# Patient Record
Sex: Female | Born: 1985 | Race: White | Hispanic: No | Marital: Married | Smoking: Never smoker
Health system: Southern US, Community
[De-identification: ages and names within clinical notes are randomized; demographics above are authoritative.]

## PROBLEM LIST (undated history)

## (undated) ENCOUNTER — Inpatient Hospital Stay (HOSPITAL_COMMUNITY): Payer: Self-pay

## (undated) DIAGNOSIS — G2581 Restless legs syndrome: Secondary | ICD-10-CM

## (undated) DIAGNOSIS — F419 Anxiety disorder, unspecified: Secondary | ICD-10-CM

## (undated) DIAGNOSIS — R011 Cardiac murmur, unspecified: Secondary | ICD-10-CM

## (undated) DIAGNOSIS — K589 Irritable bowel syndrome without diarrhea: Secondary | ICD-10-CM

## (undated) DIAGNOSIS — K219 Gastro-esophageal reflux disease without esophagitis: Secondary | ICD-10-CM

## (undated) DIAGNOSIS — F329 Major depressive disorder, single episode, unspecified: Secondary | ICD-10-CM

## (undated) DIAGNOSIS — G43909 Migraine, unspecified, not intractable, without status migrainosus: Secondary | ICD-10-CM

## (undated) DIAGNOSIS — D649 Anemia, unspecified: Secondary | ICD-10-CM

## (undated) DIAGNOSIS — E785 Hyperlipidemia, unspecified: Secondary | ICD-10-CM

## (undated) DIAGNOSIS — F32A Depression, unspecified: Secondary | ICD-10-CM

## (undated) DIAGNOSIS — Z8619 Personal history of other infectious and parasitic diseases: Secondary | ICD-10-CM

## (undated) DIAGNOSIS — Z86018 Personal history of other benign neoplasm: Secondary | ICD-10-CM

## (undated) HISTORY — DX: Irritable bowel syndrome, unspecified: K58.9

## (undated) HISTORY — DX: Hyperlipidemia, unspecified: E78.5

## (undated) HISTORY — PX: WISDOM TOOTH EXTRACTION: SHX21

## (undated) HISTORY — DX: Major depressive disorder, single episode, unspecified: F32.9

## (undated) HISTORY — DX: Personal history of other benign neoplasm: Z86.018

## (undated) HISTORY — DX: Migraine, unspecified, not intractable, without status migrainosus: G43.909

## (undated) HISTORY — DX: Gastro-esophageal reflux disease without esophagitis: K21.9

## (undated) HISTORY — DX: Restless legs syndrome: G25.81

## (undated) HISTORY — DX: Anemia, unspecified: D64.9

## (undated) HISTORY — DX: Anxiety disorder, unspecified: F41.9

## (undated) HISTORY — DX: Depression, unspecified: F32.A

## (undated) HISTORY — DX: Personal history of other infectious and parasitic diseases: Z86.19

---
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
## 2007-06-05 ENCOUNTER — Other Ambulatory Visit: Admission: RE | Admit: 2007-06-05 | Discharge: 2007-06-05 | Payer: Self-pay | Admitting: Obstetrics and Gynecology

## 2008-06-08 ENCOUNTER — Other Ambulatory Visit: Admission: RE | Admit: 2008-06-08 | Discharge: 2008-06-08 | Payer: Self-pay | Admitting: Obstetrics and Gynecology

## 2011-01-05 NOTE — Assessment & Plan Note (Signed)
[facility]                         GASTROENTEROLOGY OFFICE NOTE   NAME:Gina Moreno, Gina Moreno                        MRN:          [id number]  DATE:10/14/2006                            DOB:          [birth year]    CHIEF COMPLAINT:  Nausea, lactose intolerance, intermittent sour  belching.   CHIEF COMPLAINT:  An 25 year old white woman who was started on proton-  pump inhibitor therapy with Prevacid about 6 years ago.  That seemed to  help indigestion problems.  That stopped working and she then began to  take AcipHex with a reasonable benefit.  She was having a lot of  belching problems which seemed to get better.  She continues to have  problems at times where she will have intermittent belching and have a  very sour and foul smell come up.  She has not been losing weight.  She  does not have dysphagia or bleeding.  She has some intermittent  epigastric discomfort which is vague and mainly a nauseous type feeling.  She has tried to avoid lactose products without significant relief.  She  does avoid pork, barbecue, hot dogs, as those will trigger it.  She  tends to be slightly nervous at times but does not have any underlying  anxiety disorder.  She saw Dr. Yehuda Budd' nurse practitioner or physical  assistant a while back and, in December, she had a negative H. pylori  serology, a negative urinalysis, and a normal CMET.  She had a negative  urine pregnancy as well.  She has not been vomiting.   PAST MEDICAL HISTORY:  1. Clinical diagnosis of gastroesophageal reflux disease.  2. Allergies and sinus problems.   FAMILY HISTORY:  A paternal grandfather had colon cancer, a grandmother  had colon polyps.  Other family history review is negative.   SOCIAL HISTORY:  She is single.  She is about to graduate from high  school.  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  No alcohol, tobacco, or drugs.   REVIEW OF SYSTEMS:  Some dysmenorrhea.   She does have a history of  hives.  She takes Zyrtec for that and her allergies. She recently had a  URI.  She uses eyeglasses at times.   All other systems are negative.   PHYSICAL EXAM:  A thin, well-developed, well-nourished white woman.  Height 5 feet 5 inches.  Weight 115 pounds.  Blood pressure 118/78.  Pulse 100.  EYES:  Anicteric.  ENT:  Normal mouth and posterior pharynx.  NECK:  Supple without thyromegaly.  CHEST:  Clear.  HEART:  S1, S2.  No rubs or gallops.  ABDOMEN:  Soft, nontender.  No organomegaly or mass.  EXTREMITIES:  Free of edema.  She is alert and oriented x3.  SKIN:  Warm, dry. No acute rash.   ASSESSMENT:  Epigastric pain/nausea.  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  She is Helicobacter negative. She may have some  component of reflux problems as well.   PLAN:  1. A trial of Levbid 1/2 to 1 tablet b.i.d.  2. CBC  with differential.  3. Consider upper gastrointestinal endoscopy though I think it would      be low yield at this point.  I will see how she responds to the      Levbid.  Another option would be Probiotics versus a course of      antibiotics for possible bacterial overgrowth which may be part of      this problem.  I will see her back in 4-6 weeks.     Iva Boop, MD,FACG  Electronically Signed    CEG/MedQ  DD: 10/14/2006  DT: 10/14/2006  Job #: [id number]   cc:   Tammy R. Collins [place], M.D.

## 2012-08-14 LAB — OB RESULTS CONSOLE RPR: RPR: NONREACTIVE

## 2012-08-14 LAB — OB RESULTS CONSOLE GC/CHLAMYDIA
Chlamydia: NEGATIVE
Gonorrhea: NEGATIVE

## 2012-08-14 LAB — OB RESULTS CONSOLE RUBELLA ANTIBODY, IGM: Rubella: IMMUNE

## 2012-08-14 LAB — OB RESULTS CONSOLE ABO/RH

## 2012-08-14 LAB — OB RESULTS CONSOLE HIV ANTIBODY (ROUTINE TESTING): HIV: NONREACTIVE

## 2012-08-20 NOTE — L&D Delivery Note (Signed)
Delivery Note At 11:18 AM a viable female was delivered via Vaginal, Spontaneous Delivery (Presentation:OA ;  ).  APGAR: , ; weight .   Placenta status: Intact, Spontaneous.  Cord: 3 vessels with the following complications: .  Cord pH: not sent  Anesthesia: Epidural  Episiotomy: None Lacerations: 2nd degree Suture Repair: 3.0 vicryl rapide Est. Blood Loss (mL): 300  Mom to postpartum.  Baby to nursery-stable.  Meriel Pica 03/14/2013, 11:41 AM

## 2012-09-27 MED ORDER — ERYTHROMYCIN 5 MG/GM OP OINT
TOPICAL_OINTMENT | OPHTHALMIC | Status: AC
  Filled 2012-09-27: qty 1

## 2013-03-08 ENCOUNTER — Encounter (HOSPITAL_COMMUNITY): Payer: Self-pay | Admitting: *Deleted

## 2013-03-08 ENCOUNTER — Inpatient Hospital Stay (HOSPITAL_COMMUNITY)
Admission: AD | Admit: 2013-03-08 | Discharge: 2013-03-08 | Disposition: A | Discharge status: Home / Self Care | Payer: 59 | Source: Ambulatory Visit | Attending: Obstetrics and Gynecology | Admitting: Obstetrics and Gynecology

## 2013-03-08 DIAGNOSIS — R5381 Other malaise: Secondary | ICD-10-CM | POA: Insufficient documentation

## 2013-03-08 DIAGNOSIS — R7989 Other specified abnormal findings of blood chemistry: Secondary | ICD-10-CM | POA: Insufficient documentation

## 2013-03-08 DIAGNOSIS — O479 False labor, unspecified: Secondary | ICD-10-CM | POA: Insufficient documentation

## 2013-03-08 HISTORY — DX: Gastro-esophageal reflux disease without esophagitis: K21.9

## 2013-03-08 HISTORY — DX: Anxiety disorder, unspecified: F41.9

## 2013-03-08 LAB — URINE MICROSCOPIC-ADD ON

## 2013-03-08 LAB — URINALYSIS, ROUTINE W REFLEX MICROSCOPIC
Bilirubin Urine: NEGATIVE
Glucose, UA: NEGATIVE mg/dL
Hgb urine dipstick: NEGATIVE
Ketones, ur: NEGATIVE mg/dL
Nitrite: NEGATIVE
Protein, ur: NEGATIVE mg/dL
Specific Gravity, Urine: <1.005 — ABNORMAL LOW (ref 1.005–1.030)
Urobilinogen, UA: 0.2 mg/dL (ref 0.0–1.0)
pH: 6.5 (ref 5.0–8.0)

## 2013-03-08 NOTE — MAU Note (Signed)
Pt presents with complaints of contractions that have gotten worse today and is very weak. Pt states that she has low iron and is not able to take it now because it makes her nauseous and she starts vomiting.

## 2013-03-12 ENCOUNTER — Telehealth (HOSPITAL_COMMUNITY): Payer: Self-pay | Admitting: *Deleted

## 2013-03-12 ENCOUNTER — Encounter (HOSPITAL_COMMUNITY): Payer: Self-pay | Admitting: *Deleted

## 2013-03-12 LAB — OB RESULTS CONSOLE GBS: GBS: NEGATIVE

## 2013-03-12 NOTE — Telephone Encounter (Signed)
Preadmission screen  

## 2013-03-13 ENCOUNTER — Encounter (HOSPITAL_COMMUNITY): Payer: Self-pay | Admitting: *Deleted

## 2013-03-13 ENCOUNTER — Inpatient Hospital Stay (HOSPITAL_COMMUNITY)
Admission: AD | Admit: 2013-03-13 | Discharge: 2013-03-13 | Disposition: A | Discharge status: Home / Self Care | Payer: 59 | Source: Ambulatory Visit | Attending: Obstetrics and Gynecology | Admitting: Obstetrics and Gynecology

## 2013-03-13 ENCOUNTER — Inpatient Hospital Stay (HOSPITAL_COMMUNITY)
Admission: AD | Admit: 2013-03-13 | Discharge: 2013-03-16 | DRG: 775 | Disposition: A | Discharge status: Home / Self Care | Payer: 59 | Source: Ambulatory Visit | Attending: Obstetrics and Gynecology | Admitting: Obstetrics and Gynecology

## 2013-03-13 DIAGNOSIS — O479 False labor, unspecified: Secondary | ICD-10-CM | POA: Insufficient documentation

## 2013-03-13 DIAGNOSIS — O878 Other venous complications in the puerperium: Secondary | ICD-10-CM | POA: Diagnosis present

## 2013-03-13 DIAGNOSIS — K649 Unspecified hemorrhoids: Secondary | ICD-10-CM | POA: Diagnosis present

## 2013-03-13 MED ORDER — OXYTOCIN 40 UNITS IN LACTATED RINGERS INFUSION - SIMPLE MED
62.5000 mL/h | INTRAVENOUS | Status: DC
  Administered 2013-03-14: 62.5 mL/h via INTRAVENOUS
  Filled 2013-03-13: qty 1000

## 2013-03-13 MED ORDER — FLEET ENEMA 7-19 GM/118ML RE ENEM: 1.0000 | ENEMA | RECTAL | Status: DC | PRN

## 2013-03-13 MED ORDER — OXYCODONE-ACETAMINOPHEN 5-325 MG PO TABS: 1.0000 | ORAL_TABLET | ORAL | Status: DC | PRN

## 2013-03-13 MED ORDER — LACTATED RINGERS IV SOLN
INTRAVENOUS | Status: DC
  Administered 2013-03-14 (×2): via INTRAVENOUS

## 2013-03-13 MED ORDER — LACTATED RINGERS IV SOLN: 500.0000 mL | INTRAVENOUS | Status: DC | PRN

## 2013-03-13 MED ORDER — CITRIC ACID-SODIUM CITRATE 334-500 MG/5ML PO SOLN: 30.0000 mL | ORAL | Status: DC | PRN

## 2013-03-13 MED ORDER — ONDANSETRON HCL 4 MG/2ML IJ SOLN: 4.0000 mg | Freq: Four times a day (QID) | INTRAMUSCULAR | Status: DC | PRN

## 2013-03-13 MED ORDER — ACETAMINOPHEN 325 MG PO TABS: 650.0000 mg | ORAL_TABLET | ORAL | Status: DC | PRN

## 2013-03-13 MED ORDER — IBUPROFEN 600 MG PO TABS
600.0000 mg | ORAL_TABLET | Freq: Four times a day (QID) | ORAL | Status: DC | PRN
  Administered 2013-03-14: 600 mg via ORAL
  Filled 2013-03-13: qty 1

## 2013-03-13 MED ORDER — OXYTOCIN BOLUS FROM INFUSION: 500.0000 mL | INTRAVENOUS | Status: DC

## 2013-03-13 MED ORDER — LIDOCAINE HCL (PF) 1 % IJ SOLN
30.0000 mL | INTRAMUSCULAR | Status: AC | PRN
  Administered 2013-03-14: 30 mL via SUBCUTANEOUS
  Filled 2013-03-13 (×2): qty 30

## 2013-03-13 NOTE — H&P (Signed)
Gina Moreno is a 27 y.o. female presenting for SROM and early labor Maternal Medical History:  Reason for admission: Rupture of membranes.   Contractions: Onset was 3-5 hours ago.   Frequency: regular.   Perceived severity is moderate.    Fetal activity: Perceived fetal activity is normal.   Last perceived fetal movement was within the past hour.      OB History   Grav Para Term Preterm Abortions TAB SAB Ect Mult Living   1              Past Medical History  Diagnosis Date  . GERD (gastroesophageal reflux disease)   . Anxiety   . Hx of varicella   . Depression   . IBS (irritable bowel syndrome)    Past Surgical History  Procedure Laterality Date  . Wisdom tooth extraction     Family History: family history includes Arthritis in her paternal grandmother; Cancer in her paternal grandfather and paternal grandmother; Depression in her paternal grandmother; Diabetes in her paternal grandmother; Hyperlipidemia in her father; and Thyroid disease in her mother. Social History:  reports that she has been passively smoking.  She has never used smokeless tobacco. She reports that she does not drink alcohol or use illicit drugs.   Prenatal Transfer Tool  Maternal Diabetes: none Genetic Screening: normal Maternal Ultrasounds/Referrals: normal Fetal Ultrasounds or other Referrals:  normal Maternal Substance Abuse:  none Significant Maternal Medications:  none Significant Maternal Lab Results:  none Other Comments:  None  ROS  Dilation: 3 Effacement (%): 90 Station: -2 Exam by:: Rudi Coco RN Blood pressure 119/94, pulse 116, temperature 98.1 F (36.7 C), resp. rate 20, height 5\' 6"  (1.676 m), weight 191 lb 3.2 oz (86.728 kg), last menstrual period 06/10/2012. Maternal Exam:  Uterine Assessment: Contraction strength is moderate.  Contraction frequency is regular.   Abdomen: Patient reports no abdominal tenderness. Fundal height is term FH.   Estimated fetal weight is AGA.    Fetal presentation: vertex  Introitus: Normal vulva. Normal vagina.  Amniotic fluid character: clear.  Pelvis: adequate for delivery.   Cervix: Cervix evaluated by digital exam.     Physical Exam  Constitutional: She is oriented to person, place, and time. She appears well-developed and well-nourished.  HENT:  Head: Normocephalic and atraumatic.  Neck: Normal range of motion. Neck supple.  Cardiovascular: Normal rate and regular rhythm.   Respiratory: Effort normal and breath sounds normal.  GI:  Term FH, FHR 142  Genitourinary:  clr AF, 2-3 cm/ vtx  Musculoskeletal: Normal range of motion.  Neurological: She is alert and oriented to person, place, and time.    Prenatal labs: ABO, Rh: O/Negative/-- (12/26 0000) Antibody: Negative (12/26 0000) Rubella: Immune (12/26 0000) RPR: Nonreactive (12/26 0000)  HBsAg: Negative (12/26 0000)  HIV: Non-reactive (12/26 0000)  GBS: Negative (07/24 0000)   Assessment/Plan: Term IUP, SROM + labor   Matrice Herro M 03/13/2013, 11:48 PM

## 2013-03-13 NOTE — MAU Note (Signed)
Leaking fld since 2130. Having intermittent pelvic pressure

## 2013-03-13 NOTE — MAU Note (Signed)
Gina Moreno is here for labor evaluation. The pain got severe around 2300 last night. She is [redacted]w[redacted]d; G1. Had her membranes stripped 7/23.

## 2013-03-14 ENCOUNTER — Inpatient Hospital Stay (HOSPITAL_COMMUNITY): Payer: 59 | Admitting: Anesthesiology

## 2013-03-14 ENCOUNTER — Encounter (HOSPITAL_COMMUNITY): Payer: Self-pay | Admitting: Obstetrics

## 2013-03-14 ENCOUNTER — Encounter (HOSPITAL_COMMUNITY): Payer: Self-pay | Admitting: Anesthesiology

## 2013-03-14 LAB — CBC
MCH: 30.9 pg (ref 26.0–34.0)
MCHC: 35 g/dL (ref 30.0–36.0)
Platelets: 226 10*3/uL (ref 150–400)

## 2013-03-14 LAB — RPR: RPR Ser Ql: NONREACTIVE

## 2013-03-14 MED ORDER — SENNOSIDES-DOCUSATE SODIUM 8.6-50 MG PO TABS
2.0000 | ORAL_TABLET | Freq: Every day | ORAL | Status: DC
  Administered 2013-03-14 – 2013-03-15 (×3): 2 via ORAL

## 2013-03-14 MED ORDER — PRENATAL MULTIVITAMIN CH
1.0000 | ORAL_TABLET | Freq: Every day | ORAL | Status: DC
  Administered 2013-03-15 – 2013-03-16 (×2): 1 via ORAL
  Filled 2013-03-14 (×2): qty 1

## 2013-03-14 MED ORDER — LIDOCAINE HCL (PF) 1 % IJ SOLN
INTRAMUSCULAR | Status: DC | PRN
  Administered 2013-03-14 (×2): 5 mL

## 2013-03-14 MED ORDER — TETANUS-DIPHTH-ACELL PERTUSSIS 5-2.5-18.5 LF-MCG/0.5 IM SUSP: 0.5000 mL | Freq: Once | INTRAMUSCULAR | Status: DC

## 2013-03-14 MED ORDER — IBUPROFEN 800 MG PO TABS
800.0000 mg | ORAL_TABLET | Freq: Three times a day (TID) | ORAL | Status: DC | PRN
  Administered 2013-03-14 – 2013-03-16 (×5): 800 mg via ORAL
  Filled 2013-03-14 (×6): qty 1

## 2013-03-14 MED ORDER — EPHEDRINE 5 MG/ML INJ: 10.0000 mg | INTRAVENOUS | Status: DC | PRN

## 2013-03-14 MED ORDER — DIBUCAINE 1 % RE OINT
1.0000 "application " | TOPICAL_OINTMENT | RECTAL | Status: DC | PRN
  Filled 2013-03-14: qty 28

## 2013-03-14 MED ORDER — LACTATED RINGERS IV SOLN
500.0000 mL | Freq: Once | INTRAVENOUS | Status: AC
  Administered 2013-03-14: 500 mL via INTRAVENOUS

## 2013-03-14 MED ORDER — ONDANSETRON HCL 4 MG PO TABS: 4.0000 mg | ORAL_TABLET | ORAL | Status: DC | PRN

## 2013-03-14 MED ORDER — WITCH HAZEL-GLYCERIN EX PADS
1.0000 "application " | MEDICATED_PAD | CUTANEOUS | Status: DC | PRN
  Administered 2013-03-14: 1 via TOPICAL

## 2013-03-14 MED ORDER — BUTORPHANOL TARTRATE 1 MG/ML IJ SOLN
INTRAMUSCULAR | Status: AC
  Filled 2013-03-14: qty 1

## 2013-03-14 MED ORDER — ZOLPIDEM TARTRATE 5 MG PO TABS: 5.0000 mg | ORAL_TABLET | Freq: Every evening | ORAL | Status: DC | PRN

## 2013-03-14 MED ORDER — FENTANYL 2.5 MCG/ML BUPIVACAINE 1/10 % EPIDURAL INFUSION (WH - ANES)
14.0000 mL/h | INTRAMUSCULAR | Status: DC | PRN
  Administered 2013-03-14 (×2): 14 mL/h via EPIDURAL
  Filled 2013-03-14 (×2): qty 125

## 2013-03-14 MED ORDER — BENZOCAINE-MENTHOL 20-0.5 % EX AERO
1.0000 "application " | INHALATION_SPRAY | CUTANEOUS | Status: DC | PRN
  Administered 2013-03-14: 1 via TOPICAL
  Filled 2013-03-14: qty 56

## 2013-03-14 MED ORDER — PHENYLEPHRINE 40 MCG/ML (10ML) SYRINGE FOR IV PUSH (FOR BLOOD PRESSURE SUPPORT): 80.0000 ug | PREFILLED_SYRINGE | INTRAVENOUS | Status: DC | PRN

## 2013-03-14 MED ORDER — ONDANSETRON HCL 4 MG/2ML IJ SOLN: 4.0000 mg | INTRAMUSCULAR | Status: DC | PRN

## 2013-03-14 MED ORDER — EPHEDRINE 5 MG/ML INJ
10.0000 mg | INTRAVENOUS | Status: DC | PRN
  Filled 2013-03-14: qty 4

## 2013-03-14 MED ORDER — MEASLES, MUMPS & RUBELLA VAC ~~LOC~~ INJ
0.5000 mL | INJECTION | Freq: Once | SUBCUTANEOUS | Status: DC
  Filled 2013-03-14: qty 0.5

## 2013-03-14 MED ORDER — BUTORPHANOL TARTRATE 1 MG/ML IJ SOLN
1.0000 mg | Freq: Once | INTRAMUSCULAR | Status: AC
  Administered 2013-03-14: 1 mg via INTRAVENOUS

## 2013-03-14 MED ORDER — PHENYLEPHRINE 40 MCG/ML (10ML) SYRINGE FOR IV PUSH (FOR BLOOD PRESSURE SUPPORT)
80.0000 ug | PREFILLED_SYRINGE | INTRAVENOUS | Status: DC | PRN
  Filled 2013-03-14: qty 5

## 2013-03-14 MED ORDER — LANOLIN HYDROUS EX OINT: TOPICAL_OINTMENT | CUTANEOUS | Status: DC | PRN

## 2013-03-14 MED ORDER — DIPHENHYDRAMINE HCL 25 MG PO CAPS: 25.0000 mg | ORAL_CAPSULE | Freq: Four times a day (QID) | ORAL | Status: DC | PRN

## 2013-03-14 MED ORDER — FLEET ENEMA 7-19 GM/118ML RE ENEM: 1.0000 | ENEMA | Freq: Every day | RECTAL | Status: DC | PRN

## 2013-03-14 MED ORDER — SODIUM BICARBONATE 8.4 % IV SOLN
INTRAVENOUS | Status: DC | PRN
  Administered 2013-03-14: 5 mL via EPIDURAL

## 2013-03-14 MED ORDER — BUPIVACAINE HCL (PF) 0.25 % IJ SOLN
INTRAMUSCULAR | Status: DC | PRN
  Administered 2013-03-14: 10 mL via EPIDURAL

## 2013-03-14 MED ORDER — DIPHENHYDRAMINE HCL 50 MG/ML IJ SOLN: 12.5000 mg | INTRAMUSCULAR | Status: DC | PRN

## 2013-03-14 MED ORDER — SIMETHICONE 80 MG PO CHEW: 80.0000 mg | CHEWABLE_TABLET | ORAL | Status: DC | PRN

## 2013-03-14 MED ORDER — OXYCODONE-ACETAMINOPHEN 5-325 MG PO TABS
1.0000 | ORAL_TABLET | Freq: Four times a day (QID) | ORAL | Status: DC | PRN
  Administered 2013-03-14 (×2): 1 via ORAL
  Administered 2013-03-15: 2 via ORAL
  Administered 2013-03-15: 1 via ORAL
  Administered 2013-03-16 (×2): 2 via ORAL
  Filled 2013-03-14 (×3): qty 1
  Filled 2013-03-14 (×3): qty 2

## 2013-03-14 MED ORDER — BISACODYL 10 MG RE SUPP: 10.0000 mg | Freq: Every day | RECTAL | Status: DC | PRN

## 2013-03-14 NOTE — Anesthesia Preprocedure Evaluation (Signed)
Anesthesia Evaluation  Patient identified by MRN, date of birth, ID band Patient awake    Reviewed: Allergy & Precautions, H&P , Patient's Chart, lab work & pertinent test results  Airway Mallampati: II TM Distance: >3 FB Neck ROM: full    Dental no notable dental hx.    Pulmonary neg pulmonary ROS,  breath sounds clear to auscultation  Pulmonary exam normal       Cardiovascular negative cardio ROS  Rhythm:regular Rate:Normal     Neuro/Psych negative neurological ROS  negative psych ROS   GI/Hepatic negative GI ROS, Neg liver ROS, GERD-  ,  Endo/Other  negative endocrine ROS  Renal/GU negative Renal ROS     Musculoskeletal   Abdominal   Peds  Hematology negative hematology ROS (+)   Anesthesia Other Findings GERD (gastroesophageal reflux disease)     Anxiety        Hx of varicella     Depression        IBS (irritable bowel syndrome)                 Reproductive/Obstetrics (+) Pregnancy                           Anesthesia Physical Anesthesia Plan  ASA: II  Anesthesia Plan: Epidural   Post-op Pain Management:    Induction:   Airway Management Planned:   Additional Equipment:   Intra-op Plan:   Post-operative Plan:   Informed Consent: I have reviewed the patients History and Physical, chart, labs and discussed the procedure including the risks, benefits and alternatives for the proposed anesthesia with the patient or authorized representative who has indicated his/her understanding and acceptance.     Plan Discussed with:   Anesthesia Plan Comments:         Anesthesia Quick Evaluation

## 2013-03-14 NOTE — Anesthesia Postprocedure Evaluation (Signed)
Anesthesia Post Note  Patient: Gina Moreno  Procedure(s) Performed: * No procedures listed *  Anesthesia type: Epidural  Patient location: Mother/Baby  Post pain: Pain level controlled  Post assessment: Post-op Vital signs reviewed  Last Vitals:  Filed Vitals:   03/14/13 1415  BP: 115/74  Pulse: 96  Temp: 36.5 C  Resp: 18    Post vital signs: Reviewed  Level of consciousness:alert  Complications: No apparent anesthesia complications

## 2013-03-14 NOTE — Anesthesia Procedure Notes (Signed)
Epidural Patient location during procedure: OB Start time: 03/14/2013 1:20 AM  Staffing Anesthesiologist: Angus Seller., Harrell Gave. Performed by: anesthesiologist   Preanesthetic Checklist Completed: patient identified, site marked, surgical consent, pre-op evaluation, timeout performed, IV checked, risks and benefits discussed and monitors and equipment checked  Epidural Patient position: sitting Prep: site prepped and draped and DuraPrep Patient monitoring: continuous pulse ox and blood pressure Approach: midline Injection technique: LOR air and LOR saline  Needle:  Needle type: Tuohy  Needle gauge: 17 G Needle length: 9 cm and 9 Needle insertion depth: 5 cm cm Catheter type: closed end flexible Catheter size: 19 Gauge Catheter at skin depth: 10 cm Test dose: negative  Assessment Events: blood not aspirated, injection not painful, no injection resistance, negative IV test and no paresthesia  Additional Notes Patient identified.  Risk benefits discussed including failed block, incomplete pain control, headache, nerve damage, paralysis, blood pressure changes, nausea, vomiting, reactions to medication both toxic or allergic, and postpartum back pain.  Patient expressed understanding and wished to proceed.  All questions were answered.  Sterile technique used throughout procedure and epidural site dressed with sterile barrier dressing. No paresthesia or other complications noted.The patient did not experience any signs of intravascular injection such as tinnitus or metallic taste in mouth nor signs of intrathecal spread such as rapid motor block. Please see nursing notes for vital signs.

## 2013-03-15 ENCOUNTER — Encounter (HOSPITAL_COMMUNITY): Payer: Self-pay | Admitting: *Deleted

## 2013-03-15 LAB — CBC
HCT: 25 % — ABNORMAL LOW (ref 36.0–46.0)
MCH: 30.8 pg (ref 26.0–34.0)
MCHC: 34.4 g/dL (ref 30.0–36.0)
MCV: 89.6 fL (ref 78.0–100.0)
RDW: 14.2 % (ref 11.5–15.5)

## 2013-03-15 LAB — ABO/RH: ABO/RH(D): O NEG

## 2013-03-15 MED ORDER — RHO D IMMUNE GLOBULIN 1500 UNIT/2ML IJ SOLN
300.0000 ug | Freq: Once | INTRAMUSCULAR | Status: AC
  Administered 2013-03-15: 300 ug via INTRAMUSCULAR
  Filled 2013-03-15: qty 2

## 2013-03-15 NOTE — Clinical Social Work Note (Signed)
CSW spoke with MOB.  No concerns with anxiety at this time.    Patient was referred for history of depression/anxiety. * Referral screened out by Clinical Social Worker because none of the following criteria appear to apply: ~ History of anxiety/depression during this pregnancy, or of post-partum depression. ~ Diagnosis of anxiety and/or depression within last 3 years ~ History of depression due to pregnancy loss/loss of child  OR  * Patient's symptoms currently being treated with medication and/or therapy.  Please contact the Clinical Social Worker if needs arise, or by the patient's request.  

## 2013-03-15 NOTE — Progress Notes (Signed)
Post Partum Day 1 Subjective: no complaints  Objective: Blood pressure 98/43, pulse 78, temperature 98.3 F (36.8 C), temperature source Oral, resp. rate 16, height 5\' 6"  (1.676 m), weight 191 lb 3.2 oz (86.728 kg), last menstrual period 06/10/2012, SpO2 99.00%, unknown if currently breastfeeding.  Physical Exam:  General: alert Lochia: appropriate Uterine Fundus: firm Incision: healing well DVT Evaluation: No evidence of DVT seen on physical exam.   Recent Labs  03/14/13 0035 03/15/13 0657  HGB 12.2 8.6*  HCT 34.9* 25.0*    Assessment/Plan: Plan for discharge tomorrow   LOS: 2 days   Bastion Bolger M 03/15/2013, 9:10 AM

## 2013-03-16 LAB — RH IG WORKUP (INCLUDES ABO/RH)
Antibody Screen: NEGATIVE
Unit division: 0

## 2013-03-16 MED ORDER — HYDROCORTISONE ACE-PRAMOXINE 1-1 % RE FOAM: 1.0000 | Freq: Two times a day (BID) | RECTAL | Status: DC

## 2013-03-16 MED ORDER — IBUPROFEN 800 MG PO TABS: 800.0000 mg | ORAL_TABLET | Freq: Three times a day (TID) | ORAL | Status: DC | PRN

## 2013-03-16 MED ORDER — OXYCODONE-ACETAMINOPHEN 5-325 MG PO TABS: 1.0000 | ORAL_TABLET | Freq: Four times a day (QID) | ORAL | Status: DC | PRN

## 2013-03-16 MED ORDER — PANTOPRAZOLE SODIUM 20 MG PO TBEC: 40.0000 mg | DELAYED_RELEASE_TABLET | Freq: Every day | ORAL | Status: DC

## 2013-03-16 MED ORDER — PANTOPRAZOLE SODIUM 40 MG IV SOLR
40.0000 mg | Freq: Every day | INTRAVENOUS | Status: DC
  Filled 2013-03-16: qty 40

## 2013-03-16 NOTE — Discharge Summary (Signed)
Obstetric Discharge Summary Reason for Admission: onset of labor and rupture of membranes Prenatal Procedures: ultrasound Intrapartum Procedures: spontaneous vaginal delivery Postpartum Procedures: none Complications-Operative and Postpartum: 2 degree perineal laceration Hemoglobin  Date Value Range Status  03/15/2013 8.6* 12.0 - 15.0 g/dL Final     DELTA CHECK NOTED     REPEATED TO VERIFY     HCT  Date Value Range Status  03/15/2013 25.0* 36.0 - 46.0 % Final    Physical Exam:  General: alert and cooperative Lochia: appropriate Uterine Fundus: firm Incision: perineum intact + hemorrhoids DVT Evaluation: No evidence of DVT seen on physical exam. Negative Homan's sign. No cords or calf tenderness. Calf/Ankle edema is present.  Discharge Diagnoses: Term Pregnancy-delivered  Discharge Information: Date: 03/16/2013 Activity: pelvic rest Diet: routine Medications: PNV, Ibuprofen, Percocet and proctofoam hc Condition: stable Instructions: refer to practice specific booklet Discharge to: home   Newborn Data: Live born female  Birth Weight: 8 lb 9.7 oz (3904 g) APGAR: ,   Home with mother.  CURTIS,CAROL G 03/16/2013, 8:13 AM

## 2013-03-19 ENCOUNTER — Encounter (HOSPITAL_COMMUNITY): Payer: Self-pay | Admitting: *Deleted

## 2013-06-16 ENCOUNTER — Ambulatory Visit: Payer: 59 | Admitting: Family Medicine

## 2013-07-23 ENCOUNTER — Encounter: Payer: Self-pay | Admitting: Family Medicine

## 2013-07-23 ENCOUNTER — Ambulatory Visit (INDEPENDENT_AMBULATORY_CARE_PROVIDER_SITE_OTHER): Payer: 59 | Admitting: Family Medicine

## 2013-07-23 VITALS — BP 118/82 | HR 74 | Temp 98.2°F | Ht 67.5 in | Wt 171.1 lb

## 2013-07-23 DIAGNOSIS — F329 Major depressive disorder, single episode, unspecified: Secondary | ICD-10-CM

## 2013-07-23 DIAGNOSIS — F341 Dysthymic disorder: Secondary | ICD-10-CM

## 2013-07-23 DIAGNOSIS — Z0000 Encounter for general adult medical examination without abnormal findings: Secondary | ICD-10-CM

## 2013-07-23 DIAGNOSIS — E785 Hyperlipidemia, unspecified: Secondary | ICD-10-CM

## 2013-07-23 DIAGNOSIS — D649 Anemia, unspecified: Secondary | ICD-10-CM

## 2013-07-23 DIAGNOSIS — K219 Gastro-esophageal reflux disease without esophagitis: Secondary | ICD-10-CM

## 2013-07-23 DIAGNOSIS — Z23 Encounter for immunization: Secondary | ICD-10-CM

## 2013-07-23 LAB — RENAL FUNCTION PANEL
Albumin: 4 g/dL (ref 3.5–5.2)
Calcium: 9.4 mg/dL (ref 8.4–10.5)
Glucose, Bld: 102 mg/dL — ABNORMAL HIGH (ref 70–99)
Sodium: 140 mEq/L (ref 135–145)

## 2013-07-23 LAB — LIPID PANEL
Cholesterol: 210 mg/dL — ABNORMAL HIGH (ref 0–200)
Total CHOL/HDL Ratio: 3.4 Ratio

## 2013-07-23 LAB — HEPATIC FUNCTION PANEL
ALT: 11 U/L (ref 0–35)
AST: 11 U/L (ref 0–37)
Albumin: 4 g/dL (ref 3.5–5.2)
Total Bilirubin: 0.4 mg/dL (ref 0.3–1.2)

## 2013-07-23 LAB — CBC
Hemoglobin: 12.8 g/dL (ref 12.0–15.0)
MCH: 27.6 pg (ref 26.0–34.0)
MCHC: 33.6 g/dL (ref 30.0–36.0)
RDW: 14.4 % (ref 11.5–15.5)

## 2013-07-23 MED ORDER — FLUOXETINE HCL 20 MG PO TABS: 20.0000 mg | ORAL_TABLET | Freq: Every day | ORAL | Status: DC

## 2013-07-23 MED ORDER — DEXILANT 60 MG PO CPDR: 60.0000 mg | DELAYED_RELEASE_CAPSULE | Freq: Every day | ORAL | Status: DC

## 2013-07-23 MED ORDER — DEXLANSOPRAZOLE 60 MG PO CPDR: 60.0000 mg | DELAYED_RELEASE_CAPSULE | Freq: Every day | ORAL | Status: DC

## 2013-07-23 NOTE — Patient Instructions (Signed)
Gastroesophageal Reflux Disease, Adult  Gastroesophageal reflux disease (GERD) happens when acid from your stomach flows up into the esophagus. When acid comes in contact with the esophagus, the acid causes soreness (inflammation) in the esophagus. Over time, GERD may create small holes (ulcers) in the lining of the esophagus.  CAUSES   · Increased body weight. This puts pressure on the stomach, making acid rise from the stomach into the esophagus.  · Smoking. This increases acid production in the stomach.  · Drinking alcohol. This causes decreased pressure in the lower esophageal sphincter (valve or ring of muscle between the esophagus and stomach), allowing acid from the stomach into the esophagus.  · Late evening meals and a full stomach. This increases pressure and acid production in the stomach.  · A malformed lower esophageal sphincter.  Sometimes, no cause is found.  SYMPTOMS   · Burning pain in the lower part of the mid-chest behind the breastbone and in the mid-stomach area. This may occur twice a week or more often.  · Trouble swallowing.  · Sore throat.  · Dry cough.  · Asthma-like symptoms including chest tightness, shortness of breath, or wheezing.  DIAGNOSIS   Your caregiver may be able to diagnose GERD based on your symptoms. In some cases, X-rays and other tests may be done to check for complications or to check the condition of your stomach and esophagus.  TREATMENT   Your caregiver may recommend over-the-counter or prescription medicines to help decrease acid production. Ask your caregiver before starting or adding any new medicines.   HOME CARE INSTRUCTIONS   · Change the factors that you can control. Ask your caregiver for guidance concerning weight loss, quitting smoking, and alcohol consumption.  · Avoid foods and drinks that make your symptoms worse, such as:  · Caffeine or alcoholic drinks.  · Chocolate.  · Peppermint or mint flavorings.  · Garlic and onions.  · Spicy foods.  · Citrus fruits,  such as oranges, lemons, or limes.  · Tomato-based foods such as sauce, chili, salsa, and pizza.  · Fried and fatty foods.  · Avoid lying down for the 3 hours prior to your bedtime or prior to taking a nap.  · Eat small, frequent meals instead of large meals.  · Wear loose-fitting clothing. Do not wear anything tight around your waist that causes pressure on your stomach.  · Raise the head of your bed 6 to 8 inches with wood blocks to help you sleep. Extra pillows will not help.  · Only take over-the-counter or prescription medicines for pain, discomfort, or fever as directed by your caregiver.  · Do not take aspirin, ibuprofen, or other nonsteroidal anti-inflammatory drugs (NSAIDs).  SEEK IMMEDIATE MEDICAL CARE IF:   · You have pain in your arms, neck, jaw, teeth, or back.  · Your pain increases or changes in intensity or duration.  · You develop nausea, vomiting, or sweating (diaphoresis).  · You develop shortness of breath, or you faint.  · Your vomit is green, yellow, black, or looks like coffee grounds or blood.  · Your stool is red, bloody, or black.  These symptoms could be signs of other problems, such as heart disease, gastric bleeding, or esophageal bleeding.  MAKE SURE YOU:   · Understand these instructions.  · Will watch your condition.  · Will get help right away if you are not doing well or get worse.  Document Released: 05/16/2005 Document Revised: 10/29/2011 Document Reviewed: 02/23/2011  [employer]® Patient   Information ©2014 [employer].

## 2013-07-23 NOTE — Progress Notes (Signed)
Pre visit review using our clinic review tool, if applicable. No additional management support is needed unless otherwise documented below in the visit note. 

## 2013-07-25 ENCOUNTER — Encounter: Payer: Self-pay | Admitting: Family Medicine

## 2013-07-25 DIAGNOSIS — F32A Depression, unspecified: Secondary | ICD-10-CM

## 2013-07-25 DIAGNOSIS — E785 Hyperlipidemia, unspecified: Secondary | ICD-10-CM

## 2013-07-25 DIAGNOSIS — Z0000 Encounter for general adult medical examination without abnormal findings: Secondary | ICD-10-CM | POA: Insufficient documentation

## 2013-07-25 DIAGNOSIS — K219 Gastro-esophageal reflux disease without esophagitis: Secondary | ICD-10-CM

## 2013-07-25 DIAGNOSIS — E782 Mixed hyperlipidemia: Secondary | ICD-10-CM | POA: Insufficient documentation

## 2013-07-25 DIAGNOSIS — F411 Generalized anxiety disorder: Secondary | ICD-10-CM | POA: Insufficient documentation

## 2013-07-25 DIAGNOSIS — D649 Anemia, unspecified: Secondary | ICD-10-CM

## 2013-07-25 HISTORY — DX: Hyperlipidemia, unspecified: E78.5

## 2013-07-25 HISTORY — DX: Depression, unspecified: F32.A

## 2013-07-25 HISTORY — DX: Gastro-esophageal reflux disease without esophagitis: K21.9

## 2013-07-25 HISTORY — DX: Anemia, unspecified: D64.9

## 2013-07-25 NOTE — Assessment & Plan Note (Addendum)
Was struggling with PPD but is now struggling more with more anxiety. Will try fluoxetine 20 mg daily

## 2013-07-25 NOTE — Assessment & Plan Note (Signed)
Well controlled on Dexilent, encouraged probiotics and avoid offending.

## 2013-07-25 NOTE — Progress Notes (Signed)
Patient ID: Gina Moreno, female   DOB: 09/20/1985, 27 y.o.   MRN: [id number] MALLIE GIAMBRA [id number] February 02, 1986 07/25/2013      Progress Note New Patient  Subjective  Chief Complaint  Chief Complaint  Patient presents with  . Establish Care    new patient  . Injections    flu    HPI  Patient is a 27 year old Caucasian female who is in today for new patient appointment. She is generally in good health but has been struggling with postpartum depression and now increasing anxiety levels since the birth of her son. She reports her depression is lifted anxious and irritable frequently. Has failed numerous antidepressants hopefully to try another one. Denies any recent illness. No fevers or chills. No headache or chest pain. No palpitations or shortness of breath. She struggles with some reflux but is generally well-controlled on to excellent. No GU complaints. Does have some nausea occasionally but not routinely  Past Medical History  Diagnosis Date  . GERD (gastroesophageal reflux disease)   . Anxiety   . Hx of varicella   . Depression   . IBS (irritable bowel syndrome)   . Anemia 07/25/2013  . Esophageal reflux 07/25/2013  . Anxiety and depression 07/25/2013  . Preventative health care 07/25/2013  . Other and unspecified hyperlipidemia 07/25/2013    Past Surgical History  Procedure Laterality Date  . Wisdom tooth extraction  27 yrs old    Family History  Problem Relation Age of Onset  . Hyperlipidemia Father   . Arthritis Paternal Grandmother   . Cancer Paternal Grandmother     cervical  . Diabetes Paternal Grandmother   . Depression Paternal Grandmother   . Cancer Paternal Grandfather     lung stomach  . Thyroid disease Mother     History   Social History  . Marital Status: Married    Spouse Name: N/A    Number of Children: N/A  . Years of Education: N/A   Occupational History  . Not on file.   Social History Main Topics  . Smoking status: Passive Smoke  Exposure - Never Smoker  . Smokeless tobacco: Never Used  . Alcohol Use: No  . Drug Use: No  . Sexual Activity: Yes    Partners: Male   Other Topics Concern  . Not on file   Social History Narrative  . No narrative on file    Current Outpatient Prescriptions on File Prior to Visit  Medication Sig Dispense Refill  . ibuprofen (ADVIL,MOTRIN) 800 MG tablet Take 1 tablet (800 mg total) by mouth every 8 (eight) hours as needed.  30 tablet  1   No current facility-administered medications on file prior to visit.    Allergies  Allergen Reactions  . Doxycycline Rash    Review of Systems  Review of Systems  Constitutional: Negative for fever, chills and malaise/fatigue.  HENT: Negative for congestion, hearing loss and nosebleeds.   Eyes: Negative for discharge.  Respiratory: Negative for cough, sputum production, shortness of breath and wheezing.   Cardiovascular: Negative for chest pain, palpitations and leg swelling.  Gastrointestinal: Positive for heartburn. Negative for nausea, vomiting, abdominal pain, diarrhea, constipation and blood in stool.  Genitourinary: Negative for dysuria, urgency, frequency and hematuria.  Musculoskeletal: Negative for back pain, falls and myalgias.  Skin: Negative for rash.  Neurological: Negative for dizziness, tremors, sensory change, focal weakness, loss of consciousness, weakness and headaches.  Endo/Heme/Allergies: Negative for polydipsia. Does not bruise/bleed easily.  Psychiatric/Behavioral: Positive for  depression. Negative for suicidal ideas. The patient is nervous/anxious. The patient does not have insomnia.     Objective  BP 118/82  Pulse 74  Temp(Src) 98.2 F (36.8 C) (Oral)  Ht 5' 7.5" (1.715 m)  Wt 171 lb 1.9 oz (77.62 kg)  BMI 26.39 kg/m2  SpO2 98%  LMP 07/15/2013  Breastfeeding? No  Physical Exam  Physical Exam  Constitutional: She is oriented to person, place, and time and well-developed, well-nourished, and in no  distress. No distress.  HENT:  Head: Normocephalic and atraumatic.  Right Ear: External ear normal.  Left Ear: External ear normal.  Nose: Nose normal.  Mouth/Throat: Oropharynx is clear and moist. No oropharyngeal exudate.  Eyes: Conjunctivae are normal. Pupils are equal, round, and reactive to light. Right eye exhibits no discharge. Left eye exhibits no discharge. No scleral icterus.  Neck: Normal range of motion. Neck supple. No thyromegaly present.  Cardiovascular: Normal rate, regular rhythm, normal heart sounds and intact distal pulses.   No murmur heard. Pulmonary/Chest: Effort normal and breath sounds normal. No respiratory distress. She has no wheezes. She has no rales.  Abdominal: Soft. Bowel sounds are normal. She exhibits no distension and no mass. There is no tenderness.  Musculoskeletal: Normal range of motion. She exhibits no edema and no tenderness.  Lymphadenopathy:    She has no cervical adenopathy.  Neurological: She is alert and oriented to person, place, and time. She has normal reflexes. No cranial nerve deficit. Coordination normal.  Skin: Skin is warm and dry. No rash noted. She is not diaphoretic.  Psychiatric: Mood, memory and affect normal.       Assessment & Plan  Anemia With pregnancy, now resolved  Esophageal reflux Well controlled on Dexilent, encouraged probiotics and avoid offending.  Anxiety and depression Was struggling with PPD but is now struggling more with more anxiety. Will try fluoxetine 20 mg daily  Preventative health care Performed annual labs with patient today  Other and unspecified hyperlipidemia Mild, avoid trans fats, minimize simple carbs and saturated fats. Consider a krill oil cap daily

## 2013-07-25 NOTE — Assessment & Plan Note (Signed)
Performed annual labs with patient today

## 2013-07-25 NOTE — Assessment & Plan Note (Signed)
Mild, avoid trans fats, minimize simple carbs and saturated fats. Consider a krill oil cap daily

## 2013-07-25 NOTE — Assessment & Plan Note (Signed)
With pregnancy, now resolved

## 2013-08-27 ENCOUNTER — Ambulatory Visit (INDEPENDENT_AMBULATORY_CARE_PROVIDER_SITE_OTHER): Payer: BC Managed Care – PPO | Admitting: Family Medicine

## 2013-08-27 ENCOUNTER — Encounter: Payer: Self-pay | Admitting: Family Medicine

## 2013-08-27 VITALS — BP 130/84 | HR 93 | Temp 98.8°F | Ht 67.5 in | Wt 176.1 lb

## 2013-08-27 DIAGNOSIS — F341 Dysthymic disorder: Secondary | ICD-10-CM

## 2013-08-27 DIAGNOSIS — F329 Major depressive disorder, single episode, unspecified: Secondary | ICD-10-CM

## 2013-08-27 DIAGNOSIS — D649 Anemia, unspecified: Secondary | ICD-10-CM

## 2013-08-27 DIAGNOSIS — K219 Gastro-esophageal reflux disease without esophagitis: Secondary | ICD-10-CM

## 2013-08-27 DIAGNOSIS — E785 Hyperlipidemia, unspecified: Secondary | ICD-10-CM

## 2013-08-27 DIAGNOSIS — D72829 Elevated white blood cell count, unspecified: Secondary | ICD-10-CM

## 2013-08-27 DIAGNOSIS — Z23 Encounter for immunization: Secondary | ICD-10-CM

## 2013-08-27 DIAGNOSIS — Z0000 Encounter for general adult medical examination without abnormal findings: Secondary | ICD-10-CM

## 2013-08-27 DIAGNOSIS — F419 Anxiety disorder, unspecified: Principal | ICD-10-CM

## 2013-08-27 LAB — CBC
HEMATOCRIT: 37.5 % (ref 36.0–46.0)
Hemoglobin: 12.5 g/dL (ref 12.0–15.0)
MCH: 27.1 pg (ref 26.0–34.0)
MCHC: 33.3 g/dL (ref 30.0–36.0)
MCV: 81.3 fL (ref 78.0–100.0)
PLATELETS: 338 10*3/uL (ref 150–400)
RBC: 4.61 MIL/uL (ref 3.87–5.11)
RDW: 15.2 % (ref 11.5–15.5)
WBC: 9.8 10*3/uL (ref 4.0–10.5)

## 2013-08-27 MED ORDER — FLUOXETINE HCL 20 MG PO TABS: 20.0000 mg | ORAL_TABLET | Freq: Every day | ORAL | Status: DC

## 2013-08-27 NOTE — Patient Instructions (Signed)
Attention Deficit Hyperactivity Disorder Attention deficit hyperactivity disorder (ADHD) is a problem with behavior issues based on the way the brain functions (neurobehavioral disorder). It is a common reason for behavior and academic problems in school. CAUSES  The cause of ADHD is unknown in most cases. It may run in families. It sometimes can be associated with learning disabilities and other behavioral problems. SYMPTOMS  There are 3 types of ADHD. The 3 types and some of the symptoms include:  Inattentive  Gets bored or distracted easily.  Loses or forgets things. Forgets to hand in homework.  Has trouble organizing or completing tasks.  Difficulty staying on task.  An inability to organize daily tasks and school work.  Leaving projects, chores, or homework unfinished.  Trouble paying attention or responding to details. Careless mistakes.  Difficulty following directions. Often seems like is not listening.  Dislikes activities that require sustained attention (like chores or homework).  Hyperactive-impulsive  Feels like it is impossible to sit still or stay in a seat. Fidgeting with hands and feet.  Trouble waiting turn.  Talking too much or out of turn. Interruptive.  Speaks or acts impulsively.  Aggressive, disruptive behavior.  Constantly busy or on the go, noisy.  Combined  Has symptoms of both of the above. Often children with ADHD feel discouraged about themselves and with school. They often perform well below their abilities in school. These symptoms can cause problems in home, school, and in relationships with peers. As children get older, the excess motor activities can calm down, but the problems with paying attention and staying organized persist. Most children do not outgrow ADHD but with good treatment can learn to cope with the symptoms. DIAGNOSIS  When ADHD is suspected, the diagnosis should be made by professionals trained in ADHD.  Diagnosis will  include:  Ruling out other reasons for the child's behavior.  The caregivers will check with the child's school and check their medical records.  They will talk to teachers and parents.  Behavior rating scales for the child will be filled out by those dealing with the child on a daily basis. A diagnosis is made only after all information has been considered. TREATMENT  Treatment usually includes behavioral treatment often along with medicines. It may include stimulant medicines. The stimulant medicines decrease impulsivity and hyperactivity and increase attention. Other medicines used include antidepressants and certain blood pressure medicines. Most experts agree that treatment for ADHD should address all aspects of the child's functioning. Treatment should not be limited to the use of medicines alone. Treatment should include structured classroom management. The parents must receive education to address rewarding good behavior, discipline, and limit-setting. Tutoring or behavioral therapy or both should be available for the child. If untreated, the disorder can have long-term serious effects into adolescence and adulthood. HOME CARE INSTRUCTIONS   Often with ADHD there is a lot of frustration among the family in dealing with the illness. There is often blame and anger that is not warranted. This is a life long illness. There is no way to prevent ADHD. In many cases, because the problem affects the family as a whole, the entire family may need help. A therapist can help the family find better ways to handle the disruptive behaviors and promote change. If the child is young, most of the therapist's work is with the parents. Parents will learn techniques for coping with and improving their child's behavior. Sometimes only the child with the ADHD needs counseling. Your caregivers can help   you make these decisions.  Children with ADHD may need help in organizing. Some helpful tips include:  Keep  routines the same every day from wake-up time to bedtime. Schedule everything. This includes homework and playtime. This should include outdoor and indoor recreation. Keep the schedule on the refrigerator or a bulletin board where it is frequently seen. Mark schedule changes as far in advance as possible.  Have a place for everything and keep everything in its place. This includes clothing, backpacks, and school supplies.  Encourage writing down assignments and bringing home needed books.  Offer your child a well-balanced diet. Breakfast is especially important for school performance. Children should avoid drinks with caffeine including:  Soft drinks.  Coffee.  Tea.  However, some older children (adolescents) may find these drinks helpful in improving their attention.  Children with ADHD need consistent rules that they can understand and follow. If rules are followed, give small rewards. Children with ADHD often receive, and expect, criticism. Look for good behavior and praise it. Set realistic goals. Give clear instructions. Look for activities that can foster success and self-esteem. Make time for pleasant activities with your child. Give lots of affection.  Parents are their children's greatest advocates. Learn as much as possible about ADHD. This helps you become a stronger and better advocate for your child. It also helps you educate your child's teachers and instructors if they feel inadequate in these areas. Parent support groups are often helpful. A national group with local chapters is called CHADD (Children and Adults with Attention Deficit Hyperactivity Disorder). PROGNOSIS  There is no cure for ADHD. Children with the disorder seldom outgrow it. Many find adaptive ways to accommodate the ADHD as they mature. SEEK MEDICAL CARE IF:  Your child has repeated muscle twitches, cough or speech outbursts.  Your child has sleep problems.  Your child has a marked loss of  appetite.  Your child develops depression.  Your child has new or worsening behavioral problems.  Your child develops dizziness.  Your child has a racing heart.  Your child has stomach pains.  Your child develops headaches. Document Released: 07/27/2002 Document Revised: 10/29/2011 Document Reviewed: 02/25/2013 ExitCare Patient Information 2014 [employer].  

## 2013-08-27 NOTE — Progress Notes (Signed)
Pre visit review using our clinic review tool, if applicable. No additional management support is needed unless otherwise documented below in the visit note. 

## 2013-08-30 ENCOUNTER — Encounter: Payer: Self-pay | Admitting: Family Medicine

## 2013-08-30 NOTE — Assessment & Plan Note (Signed)
Mild, avoid trans fats, minimize simple carbs and saturated fats, increase exercise and start krill oil caps

## 2013-08-30 NOTE — Assessment & Plan Note (Signed)
Fluoxetine 20 mg daily

## 2013-08-30 NOTE — Assessment & Plan Note (Signed)
Add probiotic, avoid offending foods and continue Dexilant prn

## 2013-08-30 NOTE — Assessment & Plan Note (Signed)
resolved 

## 2013-08-30 NOTE — Progress Notes (Signed)
Patient ID: Gina Moreno, female   DOB: 1986/07/22, 28 y.o.   MRN: [id number] Gina Moreno [id number] [birth year] 08/30/2013      Progress Note-Follow Up  Subjective  Chief Complaint  Chief Complaint  Patient presents with  . Follow-up    5 week    HPI  This is a 28 year old female who is in today for followup. Generally feeling well. Reports an improvement in her mood with the Prozac. No concerning side effects. Some mild nausea initially but that has resolved. No fevers or chills. No recent illness, chest pain, palpitations or shortness of breath. No GI complaints reflux is well controlled. Taking medications as prescribed  Past Medical History  Diagnosis Date  . GERD (gastroesophageal reflux disease)   . Anxiety   . Hx of varicella   . Depression   . IBS (irritable bowel syndrome)   . Anemia 07/25/2013  . Esophageal reflux 07/25/2013  . Anxiety and depression 07/25/2013  . Preventative health care 07/25/2013  . Other and unspecified hyperlipidemia 07/25/2013    Past Surgical History  Procedure Laterality Date  . Wisdom tooth extraction  28 yrs old    Family History  Problem Relation Age of Onset  . Hyperlipidemia Father   . Arthritis Paternal Grandmother   . Cancer Paternal Grandmother     cervical  . Diabetes Paternal Grandmother   . Depression Paternal Grandmother   . Cancer Paternal Grandfather     lung stomach  . Thyroid disease Mother     History   Social History  . Marital Status: Married    Spouse Name: N/A    Number of Children: N/A  . Years of Education: N/A   Occupational History  . Not on file.   Social History Main Topics  . Smoking status: Passive Smoke Exposure - Never Smoker  . Smokeless tobacco: Never Used  . Alcohol Use: No  . Drug Use: No  . Sexual Activity: Yes    Partners: Male   Other Topics Concern  . Not on file   Social History Narrative  . No narrative on file    Current Outpatient Prescriptions on File Prior to  Visit  Medication Sig Dispense Refill  . DEXILANT 60 MG capsule Take 1 capsule (60 mg total) by mouth daily.  90 capsule  3  . GILDESS FE 1/20 1-20 MG-MCG tablet       . ibuprofen (ADVIL,MOTRIN) 800 MG tablet Take 1 tablet (800 mg total) by mouth every 8 (eight) hours as needed.  30 tablet  1   No current facility-administered medications on file prior to visit.    Allergies  Allergen Reactions  . Doxycycline Rash    Review of Systems  Review of Systems  Constitutional: Negative for fever and malaise/fatigue.  HENT: Negative for congestion.   Eyes: Negative for discharge.  Respiratory: Negative for shortness of breath.   Cardiovascular: Negative for chest pain, palpitations and leg swelling.  Gastrointestinal: Negative for nausea, abdominal pain and diarrhea.  Genitourinary: Negative for dysuria.  Musculoskeletal: Negative for falls.  Skin: Negative for rash.  Neurological: Negative for loss of consciousness and headaches.  Endo/Heme/Allergies: Negative for polydipsia.  Psychiatric/Behavioral: Negative for depression and suicidal ideas. The patient is not nervous/anxious and does not have insomnia.     Objective  BP 130/84  Pulse 93  Temp(Src) 98.8 F (37.1 C) (Oral)  Ht 5' 7.5" (1.715 m)  Wt 176 lb 1.3 oz (79.869 kg)  BMI 27.16 kg/m2  SpO2  98%  LMP 08/11/2013  Breastfeeding? No  Physical Exam  Physical Exam  Constitutional: She is oriented to person, place, and time and well-developed, well-nourished, and in no distress. No distress.  HENT:  Head: Normocephalic and atraumatic.  Eyes: Conjunctivae are normal.  Neck: Neck supple. No thyromegaly present.  Cardiovascular: Normal rate, regular rhythm and normal heart sounds.   No murmur heard. Pulmonary/Chest: Effort normal and breath sounds normal. She has no wheezes.  Abdominal: She exhibits no distension and no mass.  Musculoskeletal: She exhibits no edema.  Lymphadenopathy:    She has no cervical adenopathy.   Neurological: She is alert and oriented to person, place, and time.  Skin: Skin is warm and dry. No rash noted. She is not diaphoretic.  Psychiatric: Memory, affect and judgment normal.    Lab Results  Component Value Date   TSH 1.176 07/23/2013   Lab Results  Component Value Date   WBC 9.8 08/27/2013   HGB 12.5 08/27/2013   HCT 37.5 08/27/2013   MCV 81.3 08/27/2013   PLT 338 08/27/2013   Lab Results  Component Value Date   CREATININE 0.60 07/23/2013   BUN 13 07/23/2013   NA 140 07/23/2013   K 4.1 07/23/2013   CL 105 07/23/2013   CO2 27 07/23/2013   Lab Results  Component Value Date   ALT 11 07/23/2013   AST 11 07/23/2013   ALKPHOS 73 07/23/2013   BILITOT 0.4 07/23/2013   Lab Results  Component Value Date   CHOL 210* 07/23/2013   Lab Results  Component Value Date   HDL 61 07/23/2013   Lab Results  Component Value Date   LDLCALC 119* 07/23/2013   Lab Results  Component Value Date   TRIG 151* 07/23/2013   Lab Results  Component Value Date   CHOLHDL 3.4 07/23/2013     Assessment & Plan  Anxiety and depression Fluoxetine 20 mg daily  Anemia resolved  Other and unspecified hyperlipidemia Mild, avoid trans fats, minimize simple carbs and saturated fats, increase exercise and start krill oil caps  Esophageal reflux Add probiotic, avoid offending foods and continue Dexilant prn

## 2013-10-13 ENCOUNTER — Telehealth: Payer: Self-pay

## 2013-10-13 ENCOUNTER — Other Ambulatory Visit: Payer: Self-pay

## 2013-10-13 DIAGNOSIS — Z23 Encounter for immunization: Secondary | ICD-10-CM

## 2013-10-13 DIAGNOSIS — F419 Anxiety disorder, unspecified: Secondary | ICD-10-CM

## 2013-10-13 DIAGNOSIS — K219 Gastro-esophageal reflux disease without esophagitis: Secondary | ICD-10-CM

## 2013-10-13 DIAGNOSIS — F329 Major depressive disorder, single episode, unspecified: Secondary | ICD-10-CM

## 2013-10-13 DIAGNOSIS — Z0000 Encounter for general adult medical examination without abnormal findings: Secondary | ICD-10-CM

## 2013-10-13 MED ORDER — DEXILANT 60 MG PO CPDR: 60.0000 mg | DELAYED_RELEASE_CAPSULE | Freq: Every day | ORAL | Status: DC

## 2013-10-13 NOTE — Telephone Encounter (Signed)
Pt left a message stating that [facility] is stating that a PA needs to be done for her Dexilant.  I called pt and informed her that we would get this started in the morning. Pt voiced understanding and said she still has about 20-30 tablets.  Pt did state that she has been on Dexilant for 3 years. Pt has tried and failed prilosec, nexium and prevacid.    Please start a PA

## 2013-10-14 NOTE — Telephone Encounter (Signed)
PA form forward to nurse °

## 2013-10-14 NOTE — Telephone Encounter (Signed)
Faxed to pharmacy

## 2013-10-19 NOTE — Telephone Encounter (Signed)
PA for Dexilant Cap has been approved effective 09-18-13 through 10-18-14

## 2013-11-05 ENCOUNTER — Ambulatory Visit: Payer: Self-pay | Admitting: Family Medicine

## 2014-03-15 ENCOUNTER — Other Ambulatory Visit: Payer: Self-pay | Admitting: Family Medicine

## 2014-03-15 NOTE — Telephone Encounter (Signed)
Please inform pt of below message

## 2014-03-15 NOTE — Telephone Encounter (Signed)
Please advise refill? Pt was supposed to return 11-05-13 and cancelled appt?

## 2014-03-15 NOTE — Telephone Encounter (Signed)
She can have a 30 day supply and 1 rf of the Fluoxetine but no more til seen since this was a new med and we have to document improvement

## 2014-05-11 ENCOUNTER — Other Ambulatory Visit: Payer: Self-pay | Admitting: Family Medicine

## 2014-05-20 ENCOUNTER — Encounter: Payer: Self-pay | Admitting: Family Medicine

## 2014-05-20 ENCOUNTER — Ambulatory Visit (INDEPENDENT_AMBULATORY_CARE_PROVIDER_SITE_OTHER): Payer: BC Managed Care – PPO | Admitting: Family Medicine

## 2014-05-20 VITALS — BP 121/79 | HR 74 | Temp 98.5°F | Ht 67.5 in | Wt 180.8 lb

## 2014-05-20 DIAGNOSIS — F329 Major depressive disorder, single episode, unspecified: Secondary | ICD-10-CM

## 2014-05-20 DIAGNOSIS — F418 Other specified anxiety disorders: Secondary | ICD-10-CM

## 2014-05-20 DIAGNOSIS — F419 Anxiety disorder, unspecified: Secondary | ICD-10-CM

## 2014-05-20 DIAGNOSIS — G2581 Restless legs syndrome: Secondary | ICD-10-CM

## 2014-05-20 DIAGNOSIS — K219 Gastro-esophageal reflux disease without esophagitis: Secondary | ICD-10-CM

## 2014-05-20 DIAGNOSIS — F32A Depression, unspecified: Secondary | ICD-10-CM

## 2014-05-20 DIAGNOSIS — Z23 Encounter for immunization: Secondary | ICD-10-CM

## 2014-05-20 HISTORY — DX: Restless legs syndrome: G25.81

## 2014-05-20 MED ORDER — CLONAZEPAM 0.5 MG PO TABS: 0.5000 mg | ORAL_TABLET | Freq: Every day | ORAL | Status: DC | PRN

## 2014-05-20 MED ORDER — FLUOXETINE HCL 40 MG PO CAPS: 40.0000 mg | ORAL_CAPSULE | Freq: Every day | ORAL | Status: DC

## 2014-05-20 NOTE — Progress Notes (Signed)
Pre visit review using our clinic review tool, if applicable. No additional management support is needed unless otherwise documented below in the visit note. 

## 2014-05-20 NOTE — Patient Instructions (Signed)
Generalized Anxiety Disorder Generalized anxiety disorder (GAD) is a mental disorder. It interferes with life functions, including relationships, work, and school. GAD is different from normal anxiety, which everyone experiences at some point in their lives in response to specific life events and activities. Normal anxiety actually helps us prepare for and get through these life events and activities. Normal anxiety goes away after the event or activity is over.  GAD causes anxiety that is not necessarily related to specific events or activities. It also causes excess anxiety in proportion to specific events or activities. The anxiety associated with GAD is also difficult to control. GAD can vary from mild to severe. People with severe GAD can have intense waves of anxiety with physical symptoms (panic attacks).  SYMPTOMS The anxiety and worry associated with GAD are difficult to control. This anxiety and worry are related to many life events and activities and also occur more days than not for 6 months or longer. People with GAD also have three or more of the following symptoms (one or more in children):  Restlessness.   Fatigue.  Difficulty concentrating.   Irritability.  Muscle tension.  Difficulty sleeping or unsatisfying sleep. DIAGNOSIS GAD is diagnosed through an assessment by your health care provider. Your health care provider will ask you questions aboutyour mood,physical symptoms, and events in your life. Your health care provider may ask you about your medical history and use of alcohol or drugs, including prescription medicines. Your health care provider may also do a physical exam and blood tests. Certain medical conditions and the use of certain substances can cause symptoms similar to those associated with GAD. Your health care provider may refer you to a mental health specialist for further evaluation. TREATMENT The following therapies are usually used to treat GAD:    Medication. Antidepressant medication usually is prescribed for long-term daily control. Antianxiety medicines may be added in severe cases, especially when panic attacks occur.   Talk therapy (psychotherapy). Certain types of talk therapy can be helpful in treating GAD by providing support, education, and guidance. A form of talk therapy called cognitive behavioral therapy can teach you healthy ways to think about and react to daily life events and activities.  Stress managementtechniques. These include yoga, meditation, and exercise and can be very helpful when they are practiced regularly. A mental health specialist can help determine which treatment is best for you. Some people see improvement with one therapy. However, other people require a combination of therapies. Document Released: 12/01/2012 Document Revised: 12/21/2013 Document Reviewed: 12/01/2012 ExitCare Patient Information 2015 [employer]. This information is not intended to replace advice given to you by your health care provider. Make sure you discuss any questions you have with your health care provider.  

## 2014-05-23 ENCOUNTER — Encounter: Payer: Self-pay | Admitting: Family Medicine

## 2014-05-23 NOTE — Progress Notes (Signed)
Patient ID: Roland Rack, female   DOB: [birth year], 28 y.o.   MRN: [id number] DONIQUA SAXBY [id number] 1986-02-04 05/23/2014      Progress Note-Follow Up  Subjective  Chief Complaint  Chief Complaint  Patient presents with  . Follow-up    10 week  . Injections    flu    HPI  Patient is a 28 year old female in today for routine medical care. Patient is in today to discuss worsening anxiety and irritability. She acknowledges her husband has been out of work with back pain and has had surgery. She is noting that she has increased ear pulling when she gets overly stressed and that has worsened recently. She also has difficulty concentrating he notes as a teenager she was placed on ADHD medicines and she is questioning whether this would be helpful now. Is having some trouble falling asleep and staying asleep and notes some back spasms as well. Agrees to flu shot today. Denies CP/palp/SOB/HA/congestion/fevers/GI or GU c/o. Taking meds as prescribed  Past Medical History  Diagnosis Date  . GERD (gastroesophageal reflux disease)   . Anxiety   . Hx of varicella   . Depression   . IBS (irritable bowel syndrome)   . Anemia 07/25/2013  . Esophageal reflux 07/25/2013  . Anxiety and depression 07/25/2013  . Preventative health care 07/25/2013  . Other and unspecified hyperlipidemia 07/25/2013  . RLS (restless legs syndrome) 05/20/2014    Past Surgical History  Procedure Laterality Date  . Wisdom tooth extraction  28 yrs old    Family History  Problem Relation Age of Onset  . Hyperlipidemia Father   . Arthritis Paternal Grandmother   . Cancer Paternal Grandmother     cervical  . Diabetes Paternal Grandmother   . Depression Paternal Grandmother   . Cancer Paternal Grandfather     lung stomach  . Thyroid disease Mother     History   Social History  . Marital Status: Married    Spouse Name: N/A    Number of Children: N/A  . Years of Education: N/A   Occupational History  .  Not on file.   Social History Main Topics  . Smoking status: Passive Smoke Exposure - Never Smoker  . Smokeless tobacco: Never Used  . Alcohol Use: No  . Drug Use: No  . Sexual Activity: Yes    Partners: Male   Other Topics Concern  . Not on file   Social History Narrative  . No narrative on file    Current Outpatient Prescriptions on File Prior to Visit  Medication Sig Dispense Refill  . DEXILANT 60 MG capsule Take 1 capsule (60 mg total) by mouth daily.  90 capsule  1  . GILDESS FE 1/20 1-20 MG-MCG tablet        No current facility-administered medications on file prior to visit.    Allergies  Allergen Reactions  . Doxycycline Rash    Review of Systems  Review of Systems  Constitutional: Positive for malaise/fatigue. Negative for fever.  HENT: Negative for congestion.   Eyes: Negative for discharge.  Respiratory: Negative for shortness of breath.   Cardiovascular: Negative for chest pain, palpitations and leg swelling.  Gastrointestinal: Negative for nausea, abdominal pain and diarrhea.  Genitourinary: Negative for dysuria.  Musculoskeletal: Negative for falls.  Skin: Negative for rash.  Neurological: Negative for loss of consciousness and headaches.  Endo/Heme/Allergies: Negative for polydipsia.  Psychiatric/Behavioral: Positive for depression. Negative for suicidal ideas. The patient is nervous/anxious  and has insomnia.     Objective  BP 121/79  Pulse 74  Temp(Src) 98.5 F (36.9 C) (Oral)  Ht 5' 7.5" (1.715 m)  Wt 180 lb 12.8 oz (82.01 kg)  BMI 27.88 kg/m2  SpO2 100%  LMP 05/19/2014  Breastfeeding? No  Physical Exam  Physical Exam  Constitutional: She is oriented to person, place, and time and well-developed, well-nourished, and in no distress. No distress.  HENT:  Head: Normocephalic and atraumatic.  Eyes: Conjunctivae are normal.  Neck: Neck supple. No thyromegaly present.  Cardiovascular: Normal rate, regular rhythm and normal heart sounds.    No murmur heard. Pulmonary/Chest: Effort normal and breath sounds normal. She has no wheezes.  Abdominal: She exhibits no distension and no mass.  Musculoskeletal: She exhibits no edema.  Lymphadenopathy:    She has no cervical adenopathy.  Neurological: She is alert and oriented to person, place, and time.  Skin: Skin is warm and dry. No rash noted. She is not diaphoretic.  Psychiatric: Memory, affect and judgment normal.    Lab Results  Component Value Date   TSH 1.176 07/23/2013   Lab Results  Component Value Date   WBC 9.8 08/27/2013   HGB 12.5 08/27/2013   HCT 37.5 08/27/2013   MCV 81.3 08/27/2013   PLT 338 08/27/2013   Lab Results  Component Value Date   CREATININE 0.60 07/23/2013   BUN 13 07/23/2013   NA 140 07/23/2013   K 4.1 07/23/2013   CL 105 07/23/2013   CO2 27 07/23/2013   Lab Results  Component Value Date   ALT 11 07/23/2013   AST 11 07/23/2013   ALKPHOS 73 07/23/2013   BILITOT 0.4 07/23/2013   Lab Results  Component Value Date   CHOL 210* 07/23/2013   Lab Results  Component Value Date   HDL 61 07/23/2013   Lab Results  Component Value Date   LDLCALC 119* 07/23/2013   Lab Results  Component Value Date   TRIG 151* 07/23/2013   Lab Results  Component Value Date   CHOLHDL 3.4 07/23/2013     Assessment & Plan  Esophageal reflux Avoid offending foods, start probiotics. Do not eat large meals in late evening and consider raising head of bed.   RLS (restless legs syndrome) Encouraged increased hydration and magnesium increase exercise may use Clonazepam prn  Anxiety and depression Responded initially to Prozac but not working as well now. Increase to 40 mg daily and encouraged to increase exercise and add counseling. Engages in hair pulling when stressed and has a distant h/o ADHD previously treated with meds

## 2014-05-23 NOTE — Assessment & Plan Note (Signed)
Avoid offending foods, start probiotics. Do not eat large meals in late evening and consider raising head of bed.  

## 2014-05-23 NOTE — Assessment & Plan Note (Signed)
Encouraged increased hydration and magnesium increase exercise may use Clonazepam prn

## 2014-05-23 NOTE — Assessment & Plan Note (Addendum)
Responded initially to Prozac but not working as well now. Increase to 40 mg daily and encouraged to increase exercise and add counseling. Engages in hair pulling when stressed and has a distant h/o ADHD previously treated with meds

## 2014-06-04 ENCOUNTER — Ambulatory Visit: Payer: 59 | Admitting: Family Medicine

## 2014-06-08 ENCOUNTER — Ambulatory Visit (INDEPENDENT_AMBULATORY_CARE_PROVIDER_SITE_OTHER): Payer: 59 | Admitting: Licensed Clinical Social Worker

## 2014-06-08 DIAGNOSIS — F418 Other specified anxiety disorders: Secondary | ICD-10-CM

## 2014-06-08 DIAGNOSIS — F633 Trichotillomania: Secondary | ICD-10-CM

## 2014-06-21 ENCOUNTER — Encounter: Payer: Self-pay | Admitting: Family Medicine

## 2014-06-22 ENCOUNTER — Ambulatory Visit (INDEPENDENT_AMBULATORY_CARE_PROVIDER_SITE_OTHER): Payer: 59 | Admitting: Licensed Clinical Social Worker

## 2014-06-22 DIAGNOSIS — F633 Trichotillomania: Secondary | ICD-10-CM

## 2014-06-22 DIAGNOSIS — F418 Other specified anxiety disorders: Secondary | ICD-10-CM

## 2014-07-06 ENCOUNTER — Ambulatory Visit (INDEPENDENT_AMBULATORY_CARE_PROVIDER_SITE_OTHER): Payer: 59 | Admitting: Licensed Clinical Social Worker

## 2014-07-06 DIAGNOSIS — F418 Other specified anxiety disorders: Secondary | ICD-10-CM

## 2014-07-06 DIAGNOSIS — F633 Trichotillomania: Secondary | ICD-10-CM

## 2014-07-08 ENCOUNTER — Telehealth: Payer: Self-pay | Admitting: Family Medicine

## 2014-07-08 ENCOUNTER — Other Ambulatory Visit: Payer: Self-pay

## 2014-07-08 DIAGNOSIS — F419 Anxiety disorder, unspecified: Secondary | ICD-10-CM

## 2014-07-08 DIAGNOSIS — F32A Depression, unspecified: Secondary | ICD-10-CM

## 2014-07-08 DIAGNOSIS — F329 Major depressive disorder, single episode, unspecified: Secondary | ICD-10-CM

## 2014-07-08 DIAGNOSIS — Z23 Encounter for immunization: Secondary | ICD-10-CM

## 2014-07-08 DIAGNOSIS — K219 Gastro-esophageal reflux disease without esophagitis: Secondary | ICD-10-CM

## 2014-07-08 DIAGNOSIS — Z0000 Encounter for general adult medical examination without abnormal findings: Secondary | ICD-10-CM

## 2014-07-08 MED ORDER — DEXILANT 60 MG PO CPDR: 60.0000 mg | DELAYED_RELEASE_CAPSULE | Freq: Every day | ORAL | Status: DC

## 2014-07-08 NOTE — Telephone Encounter (Signed)
Caller name: Zuma Relation to pt: self Call back number: [phone] Pharmacy: [facility] in [place]  Reason for call:   Patient would like to start using [facility] for dexilant rx. Please send refill

## 2014-07-08 NOTE — Telephone Encounter (Signed)
Refilled to [facility] until next visit.

## 2014-07-20 ENCOUNTER — Ambulatory Visit (INDEPENDENT_AMBULATORY_CARE_PROVIDER_SITE_OTHER): Payer: 59 | Admitting: Licensed Clinical Social Worker

## 2014-07-20 DIAGNOSIS — F418 Other specified anxiety disorders: Secondary | ICD-10-CM

## 2014-07-20 DIAGNOSIS — F633 Trichotillomania: Secondary | ICD-10-CM

## 2014-08-03 ENCOUNTER — Ambulatory Visit (INDEPENDENT_AMBULATORY_CARE_PROVIDER_SITE_OTHER): Payer: 59 | Admitting: Licensed Clinical Social Worker

## 2014-08-03 ENCOUNTER — Other Ambulatory Visit: Payer: Self-pay | Admitting: Family Medicine

## 2014-08-03 DIAGNOSIS — F633 Trichotillomania: Secondary | ICD-10-CM

## 2014-08-03 DIAGNOSIS — F418 Other specified anxiety disorders: Secondary | ICD-10-CM

## 2014-08-05 ENCOUNTER — Other Ambulatory Visit: Payer: Self-pay | Admitting: Family Medicine

## 2014-08-05 DIAGNOSIS — F329 Major depressive disorder, single episode, unspecified: Secondary | ICD-10-CM

## 2014-08-05 DIAGNOSIS — K219 Gastro-esophageal reflux disease without esophagitis: Secondary | ICD-10-CM

## 2014-08-05 DIAGNOSIS — F419 Anxiety disorder, unspecified: Secondary | ICD-10-CM

## 2014-08-05 DIAGNOSIS — Z0000 Encounter for general adult medical examination without abnormal findings: Secondary | ICD-10-CM

## 2014-08-05 DIAGNOSIS — F32A Depression, unspecified: Secondary | ICD-10-CM

## 2014-08-05 DIAGNOSIS — Z23 Encounter for immunization: Secondary | ICD-10-CM

## 2014-08-05 MED ORDER — DEXILANT 60 MG PO CPDR: 60.0000 mg | DELAYED_RELEASE_CAPSULE | Freq: Every day | ORAL | Status: DC

## 2014-08-05 NOTE — Telephone Encounter (Signed)
Spoke with patient and advised that a 90 day supply was sent to mail order.  States that [facility] will not fill for 90 days.  Changing mail order 08/20/2014.

## 2014-08-05 NOTE — Telephone Encounter (Signed)
Caller name:Greenberger Angelissa Relation to MV:VKPQ Call back number:[phone] [facility]  Reason for call: pt is [facility] so that she can get a 90 day supply for her rx   DEXILANT 60 MG capsule    Pt would like a call to explain what is going on with the pharmacy.

## 2014-08-05 NOTE — Telephone Encounter (Signed)
If you call back after 2pm, please call [phone]

## 2014-08-23 ENCOUNTER — Ambulatory Visit: Payer: BC Managed Care – PPO | Admitting: Family Medicine

## 2014-08-24 ENCOUNTER — Ambulatory Visit (INDEPENDENT_AMBULATORY_CARE_PROVIDER_SITE_OTHER): Payer: 59 | Admitting: Licensed Clinical Social Worker

## 2014-08-24 ENCOUNTER — Ambulatory Visit: Payer: BC Managed Care – PPO | Admitting: Family Medicine

## 2014-08-24 DIAGNOSIS — F418 Other specified anxiety disorders: Secondary | ICD-10-CM

## 2014-09-05 ENCOUNTER — Other Ambulatory Visit: Payer: Self-pay | Admitting: Family Medicine

## 2014-09-08 NOTE — Telephone Encounter (Signed)
Patient requesting refill of Clonazepam (prescribed daily PRN).  - last refill 05/20/14 for 30 and 2.  Last office visit 05/20/14. Contract signed 05/20/14.   No UDS.  Please advise.    eal

## 2014-09-16 ENCOUNTER — Ambulatory Visit (INDEPENDENT_AMBULATORY_CARE_PROVIDER_SITE_OTHER): Payer: BLUE CROSS/BLUE SHIELD | Admitting: Family Medicine

## 2014-09-16 ENCOUNTER — Encounter: Payer: Self-pay | Admitting: Family Medicine

## 2014-09-16 VITALS — BP 134/90 | HR 92 | Temp 97.9°F | Resp 18 | Wt 184.4 lb

## 2014-09-16 DIAGNOSIS — F329 Major depressive disorder, single episode, unspecified: Secondary | ICD-10-CM

## 2014-09-16 DIAGNOSIS — F419 Anxiety disorder, unspecified: Principal | ICD-10-CM

## 2014-09-16 DIAGNOSIS — Z0000 Encounter for general adult medical examination without abnormal findings: Secondary | ICD-10-CM

## 2014-09-16 DIAGNOSIS — F32A Depression, unspecified: Secondary | ICD-10-CM

## 2014-09-16 DIAGNOSIS — R000 Tachycardia, unspecified: Secondary | ICD-10-CM | POA: Insufficient documentation

## 2014-09-16 DIAGNOSIS — G2581 Restless legs syndrome: Secondary | ICD-10-CM

## 2014-09-16 DIAGNOSIS — F418 Other specified anxiety disorders: Secondary | ICD-10-CM

## 2014-09-16 DIAGNOSIS — K219 Gastro-esophageal reflux disease without esophagitis: Secondary | ICD-10-CM

## 2014-09-16 DIAGNOSIS — Z23 Encounter for immunization: Secondary | ICD-10-CM

## 2014-09-16 MED ORDER — CLONAZEPAM 0.5 MG PO TABS: 0.5000 mg | ORAL_TABLET | Freq: Every day | ORAL | Status: DC | PRN

## 2014-09-16 MED ORDER — FLUOXETINE HCL 40 MG PO CAPS: 40.0000 mg | ORAL_CAPSULE | Freq: Every day | ORAL | Status: DC

## 2014-09-16 MED ORDER — DEXILANT 60 MG PO CPDR: 60.0000 mg | DELAYED_RELEASE_CAPSULE | Freq: Every day | ORAL | Status: DC

## 2014-09-16 NOTE — Progress Notes (Signed)
Pre visit review using our clinic review tool, if applicable. No additional management support is needed unless otherwise documented below in the visit note. 

## 2014-09-16 NOTE — Assessment & Plan Note (Addendum)
Check TSH today, normal, mild tachycardia, encouraged to minimize caffeine and report worsening symptoms

## 2014-09-16 NOTE — Patient Instructions (Signed)
DASH Eating Plan °DASH stands for "Dietary Approaches to Stop Hypertension." The DASH eating plan is a healthy eating plan that has been shown to reduce high blood pressure (hypertension). Additional health benefits may include reducing the risk of type 2 diabetes mellitus, heart disease, and stroke. The DASH eating plan may also help with weight loss. °WHAT DO I NEED TO KNOW ABOUT THE DASH EATING PLAN? °For the DASH eating plan, you will follow these general guidelines: °· Choose foods with a percent daily value for sodium of less than 5% (as listed on the food label). °· Use salt-free seasonings or herbs instead of table salt or sea salt. °· Check with your health care provider or pharmacist before using salt substitutes. °· Eat lower-sodium products, often labeled as "lower sodium" or "no salt added." °· Eat fresh foods. °· Eat more vegetables, fruits, and low-fat dairy products. °· Choose whole grains. Look for the word "whole" as the first word in the ingredient list. °· Choose fish and skinless chicken or turkey more often than red meat. Limit fish, poultry, and meat to 6 oz (170 g) each day. °· Limit sweets, desserts, sugars, and sugary drinks. °· Choose heart-healthy fats. °· Limit cheese to 1 oz (28 g) per day. °· Eat more home-cooked food and less restaurant, buffet, and fast food. °· Limit fried foods. °· Cook foods using methods other than frying. °· Limit canned vegetables. If you do use them, rinse them well to decrease the sodium. °· When eating at a restaurant, ask that your food be prepared with less salt, or no salt if possible. °WHAT FOODS CAN I EAT? °Seek help from a dietitian for individual calorie needs. °Grains °Whole grain or whole wheat bread. Brown rice. Whole grain or whole wheat pasta. Quinoa, bulgur, and whole grain cereals. Low-sodium cereals. Corn or whole wheat flour tortillas. Whole grain cornbread. Whole grain crackers. Low-sodium crackers. °Vegetables °Fresh or frozen vegetables  (raw, steamed, roasted, or grilled). Low-sodium or reduced-sodium tomato and vegetable juices. Low-sodium or reduced-sodium tomato sauce and paste. Low-sodium or reduced-sodium canned vegetables.  °Fruits °All fresh, canned (in natural juice), or frozen fruits. °Meat and Other Protein Products °Ground beef (85% or leaner), grass-fed beef, or beef trimmed of fat. Skinless chicken or turkey. Ground chicken or turkey. Pork trimmed of fat. All fish and seafood. Eggs. Dried beans, peas, or lentils. Unsalted nuts and seeds. Unsalted canned beans. °Dairy °Low-fat dairy products, such as skim or 1% milk, 2% or reduced-fat cheeses, low-fat ricotta or cottage cheese, or plain low-fat yogurt. Low-sodium or reduced-sodium cheeses. °Fats and Oils °Tub margarines without trans fats. Light or reduced-fat mayonnaise and salad dressings (reduced sodium). Avocado. Safflower, olive, or canola oils. Natural peanut or almond butter. °Other °Unsalted popcorn and pretzels. °The items listed above may not be a complete list of recommended foods or beverages. Contact your dietitian for more options. °WHAT FOODS ARE NOT RECOMMENDED? °Grains °White bread. White pasta. White rice. Refined cornbread. Bagels and croissants. Crackers that contain trans fat. °Vegetables °Creamed or fried vegetables. Vegetables in a cheese sauce. Regular canned vegetables. Regular canned tomato sauce and paste. Regular tomato and vegetable juices. °Fruits °Dried fruits. Canned fruit in light or heavy syrup. Fruit juice. °Meat and Other Protein Products °Fatty cuts of meat. Ribs, chicken wings, bacon, sausage, bologna, salami, chitterlings, fatback, hot dogs, bratwurst, and packaged luncheon meats. Salted nuts and seeds. Canned beans with salt. °Dairy °Whole or 2% milk, cream, half-and-half, and cream cheese. Whole-fat or sweetened yogurt. Full-fat   cheeses or blue cheese. Nondairy creamers and whipped toppings. Processed cheese, cheese spreads, or cheese  curds. °Condiments °Onion and garlic salt, seasoned salt, table salt, and sea salt. Canned and packaged gravies. Worcestershire sauce. Tartar sauce. Barbecue sauce. Teriyaki sauce. Soy sauce, including reduced sodium. Steak sauce. Fish sauce. Oyster sauce. Cocktail sauce. Horseradish. Ketchup and mustard. Meat flavorings and tenderizers. Bouillon cubes. Hot sauce. Tabasco sauce. Marinades. Taco seasonings. Relishes. °Fats and Oils °Butter, stick margarine, lard, shortening, ghee, and bacon fat. Coconut, palm kernel, or palm oils. Regular salad dressings. °Other °Pickles and olives. Salted popcorn and pretzels. °The items listed above may not be a complete list of foods and beverages to avoid. Contact your dietitian for more information. °WHERE CAN I FIND MORE INFORMATION? °[employer], [employer], [employer]: www.nhlbi.nih.gov/health/health-topics/topics/dash/ °Document Released: 07/26/2011 Document Revised: 12/21/2013 Document Reviewed: 06/10/2013 °ExitCare® Patient Information ©2015 [employer]. This information is not intended to replace advice given to you by your health care provider. Make sure you discuss any questions you have with your health care provider. ° °

## 2014-09-17 LAB — TSH: TSH: 1.49 u[IU]/mL (ref 0.35–4.50)

## 2014-09-19 ENCOUNTER — Encounter: Payer: Self-pay | Admitting: Family Medicine

## 2014-09-19 NOTE — Progress Notes (Signed)
Patient ID: Roland Rack, female   DOB: Sep 04, 1985, 29 y.o.   MRN: [id number]   MASSIE COGLIANO  [id number] 07/15/1986 09/19/2014      Progress Note-Follow Up  Subjective  Chief Complaint  Chief Complaint  Patient presents with  . Follow-up    Prozac, Klonopin    HPI  Patient is a 29 y.o. female in today for routine medical care. Is in today for follow up. No recent flare in reflux, is well controlled on current meds. No recent illness. Denies CP/palp/SOB/HA/congestion/fevers/GI or GU c/o. Taking meds as prescribed  Past Medical History  Diagnosis Date  . GERD (gastroesophageal reflux disease)   . Anxiety   . Hx of varicella   . Depression   . IBS (irritable bowel syndrome)   . Anemia 07/25/2013  . Esophageal reflux 07/25/2013  . Anxiety and depression 07/25/2013  . Preventative health care 07/25/2013  . Other and unspecified hyperlipidemia 07/25/2013  . RLS (restless legs syndrome) 05/20/2014  . Tachycardia 09/16/2014    Past Surgical History  Procedure Laterality Date  . Wisdom tooth extraction  29 yrs old    Family History  Problem Relation Age of Onset  . Hyperlipidemia Father   . Arthritis Paternal Grandmother   . Cancer Paternal Grandmother     cervical  . Diabetes Paternal Grandmother   . Depression Paternal Grandmother   . Cancer Paternal Grandfather     lung stomach  . Thyroid disease Mother     History   Social History  . Marital Status: Married    Spouse Name: N/A    Number of Children: N/A  . Years of Education: N/A   Occupational History  . Not on file.   Social History Main Topics  . Smoking status: Passive Smoke Exposure - Never Smoker  . Smokeless tobacco: Never Used  . Alcohol Use: No  . Drug Use: No  . Sexual Activity:    Partners: Male   Other Topics Concern  . Not on file   Social History Narrative    Current Outpatient Prescriptions on File Prior to Visit  Medication Sig Dispense Refill  . GILDESS FE 1/20 1-20 MG-MCG  tablet      No current facility-administered medications on file prior to visit.    Allergies  Allergen Reactions  . Doxycycline Rash    Review of Systems  Review of Systems  Constitutional: Negative for fever and malaise/fatigue.  HENT: Negative for congestion.   Eyes: Negative for discharge.  Respiratory: Negative for shortness of breath.   Cardiovascular: Negative for chest pain, palpitations and leg swelling.  Gastrointestinal: Negative for nausea, abdominal pain and diarrhea.  Genitourinary: Negative for dysuria.  Musculoskeletal: Negative for falls.  Skin: Negative for rash.  Neurological: Negative for loss of consciousness and headaches.  Endo/Heme/Allergies: Negative for polydipsia.  Psychiatric/Behavioral: Negative for depression and suicidal ideas. The patient is not nervous/anxious and does not have insomnia.     Objective  BP 134/90 mmHg  Pulse 92  Temp(Src) 97.9 F (36.6 C) (Oral)  Resp 18  Wt 184 lb 6.4 oz (83.643 kg)  SpO2 100%  Physical Exam  Physical Exam  Constitutional: She is oriented to person, place, and time and well-developed, well-nourished, and in no distress. No distress.  HENT:  Head: Normocephalic and atraumatic.  Eyes: Conjunctivae are normal.  Neck: Neck supple. No thyromegaly present.  Cardiovascular: Normal rate, regular rhythm and normal heart sounds.   No murmur heard. Pulmonary/Chest: Effort normal and breath sounds  normal. She has no wheezes.  Abdominal: She exhibits no distension and no mass.  Musculoskeletal: She exhibits no edema.  Lymphadenopathy:    She has no cervical adenopathy.  Neurological: She is alert and oriented to person, place, and time.  Skin: Skin is warm and dry. No rash noted. She is not diaphoretic.  Psychiatric: Memory, affect and judgment normal.    Lab Results  Component Value Date   TSH 1.49 09/16/2014   Lab Results  Component Value Date   WBC 9.8 08/27/2013   HGB 12.5 08/27/2013   HCT 37.5  08/27/2013   MCV 81.3 08/27/2013   PLT 338 08/27/2013   Lab Results  Component Value Date   CREATININE 0.60 07/23/2013   BUN 13 07/23/2013   NA 140 07/23/2013   K 4.1 07/23/2013   CL 105 07/23/2013   CO2 27 07/23/2013   Lab Results  Component Value Date   ALT 11 07/23/2013   AST 11 07/23/2013   ALKPHOS 73 07/23/2013   BILITOT 0.4 07/23/2013   Lab Results  Component Value Date   CHOL 210* 07/23/2013   Lab Results  Component Value Date   HDL 61 07/23/2013   Lab Results  Component Value Date   LDLCALC 119* 07/23/2013   Lab Results  Component Value Date   TRIG 151* 07/23/2013   Lab Results  Component Value Date   CHOLHDL 3.4 07/23/2013     Assessment & Plan  Tachycardia Check TSH today, normal, mild tachycardia, encouraged to minimize caffeine and report worsening symptoms   Esophageal reflux Avoid offending foods, start probiotics. Do not eat large meals in late evening and consider raising head of bed.    Anxiety and depression Good response to Fluoxetine, uses Clonazepam infrequently

## 2014-09-19 NOTE — Assessment & Plan Note (Signed)
Avoid offending foods, start probiotics. Do not eat large meals in late evening and consider raising head of bed.  

## 2014-09-19 NOTE — Assessment & Plan Note (Signed)
Good response to Fluoxetine, uses Clonazepam infrequently

## 2015-03-22 ENCOUNTER — Encounter: Payer: Self-pay | Admitting: Family Medicine

## 2015-03-22 ENCOUNTER — Ambulatory Visit (INDEPENDENT_AMBULATORY_CARE_PROVIDER_SITE_OTHER): Payer: BLUE CROSS/BLUE SHIELD | Admitting: Family Medicine

## 2015-03-22 VITALS — BP 110/70 | HR 76 | Temp 98.4°F | Wt 180.5 lb

## 2015-03-22 DIAGNOSIS — F418 Other specified anxiety disorders: Secondary | ICD-10-CM

## 2015-03-22 DIAGNOSIS — F419 Anxiety disorder, unspecified: Secondary | ICD-10-CM

## 2015-03-22 DIAGNOSIS — E782 Mixed hyperlipidemia: Secondary | ICD-10-CM | POA: Diagnosis not present

## 2015-03-22 DIAGNOSIS — R519 Headache, unspecified: Secondary | ICD-10-CM

## 2015-03-22 DIAGNOSIS — G43009 Migraine without aura, not intractable, without status migrainosus: Secondary | ICD-10-CM | POA: Insufficient documentation

## 2015-03-22 DIAGNOSIS — G43809 Other migraine, not intractable, without status migrainosus: Secondary | ICD-10-CM

## 2015-03-22 DIAGNOSIS — F329 Major depressive disorder, single episode, unspecified: Secondary | ICD-10-CM

## 2015-03-22 DIAGNOSIS — D72829 Elevated white blood cell count, unspecified: Secondary | ICD-10-CM

## 2015-03-22 DIAGNOSIS — G43909 Migraine, unspecified, not intractable, without status migrainosus: Secondary | ICD-10-CM

## 2015-03-22 DIAGNOSIS — Z0000 Encounter for general adult medical examination without abnormal findings: Secondary | ICD-10-CM | POA: Diagnosis not present

## 2015-03-22 DIAGNOSIS — R51 Headache: Secondary | ICD-10-CM

## 2015-03-22 DIAGNOSIS — F411 Generalized anxiety disorder: Secondary | ICD-10-CM

## 2015-03-22 DIAGNOSIS — Z23 Encounter for immunization: Secondary | ICD-10-CM

## 2015-03-22 DIAGNOSIS — K219 Gastro-esophageal reflux disease without esophagitis: Secondary | ICD-10-CM

## 2015-03-22 DIAGNOSIS — G2581 Restless legs syndrome: Secondary | ICD-10-CM

## 2015-03-22 DIAGNOSIS — R000 Tachycardia, unspecified: Secondary | ICD-10-CM

## 2015-03-22 HISTORY — DX: Migraine, unspecified, not intractable, without status migrainosus: G43.909

## 2015-03-22 LAB — COMPREHENSIVE METABOLIC PANEL
ALT: 13 U/L (ref 0–35)
AST: 13 U/L (ref 0–37)
Albumin: 4 g/dL (ref 3.5–5.2)
Alkaline Phosphatase: 65 U/L (ref 39–117)
BUN: 12 mg/dL (ref 6–23)
CO2: 29 mEq/L (ref 19–32)
Calcium: 9.2 mg/dL (ref 8.4–10.5)
Chloride: 105 mEq/L (ref 96–112)
Creatinine, Ser: 0.65 mg/dL (ref 0.40–1.20)
GFR: 116.41 mL/min (ref >60.00)
Glucose, Bld: 102 mg/dL — ABNORMAL HIGH (ref 70–99)
POTASSIUM: 3.6 meq/L (ref 3.5–5.1)
Sodium: 139 mEq/L (ref 135–145)
Total Bilirubin: 0.4 mg/dL (ref 0.2–1.2)
Total Protein: 7.5 g/dL (ref 6.0–8.3)

## 2015-03-22 LAB — CBC
HCT: 38.8 % (ref 36.0–46.0)
HEMOGLOBIN: 12.8 g/dL (ref 12.0–15.0)
MCHC: 33.1 g/dL (ref 30.0–36.0)
MCV: 85.6 fl (ref 78.0–100.0)
PLATELETS: 293 10*3/uL (ref 150.0–400.0)
RBC: 4.54 Mil/uL (ref 3.87–5.11)
RDW: 13.8 % (ref 11.5–15.5)
WBC: 9.2 10*3/uL (ref 4.0–10.5)

## 2015-03-22 LAB — LIPID PANEL
Cholesterol: 249 mg/dL — ABNORMAL HIGH (ref 0–200)
HDL: 63.2 mg/dL (ref >39.00)
LDL CALC: 157 mg/dL — AB (ref 0–99)
NonHDL: 185.6
TRIGLYCERIDES: 141 mg/dL (ref 0.0–149.0)
Total CHOL/HDL Ratio: 4
VLDL: 28.2 mg/dL (ref 0.0–40.0)

## 2015-03-22 LAB — TSH: TSH: 1.44 u[IU]/mL (ref 0.35–4.50)

## 2015-03-22 MED ORDER — CLONAZEPAM 0.5 MG PO TABS: 0.5000 mg | ORAL_TABLET | Freq: Every day | ORAL | Status: DC | PRN

## 2015-03-22 MED ORDER — AMLODIPINE BESYLATE 2.5 MG PO TABS: 2.5000 mg | ORAL_TABLET | Freq: Every day | ORAL | Status: DC

## 2015-03-22 MED ORDER — FLUOXETINE HCL 40 MG PO CAPS: 40.0000 mg | ORAL_CAPSULE | Freq: Every day | ORAL | Status: DC

## 2015-03-22 MED ORDER — DEXILANT 60 MG PO CPDR: 60.0000 mg | DELAYED_RELEASE_CAPSULE | Freq: Every day | ORAL | Status: DC

## 2015-03-22 MED ORDER — SUMATRIPTAN SUCCINATE 100 MG PO TABS: 50.0000 mg | ORAL_TABLET | ORAL | Status: DC | PRN

## 2015-03-22 NOTE — Assessment & Plan Note (Signed)
Avoid offending foods, take probiotics. Do not eat large meals in late evening and consider raising head of bed.  

## 2015-03-22 NOTE — Patient Instructions (Signed)

## 2015-03-22 NOTE — Assessment & Plan Note (Addendum)
Flared the last 3 months, worse the week before and after her cycle. Gets Photophobia/phonophobia.  Some nausea no vomitting. No help with Ibuprofen. Encouraged increased hydration, 64 ounces of clear fluids daily. Minimize alcohol and caffeine. Eat small frequent meals with lean proteins and complex carbs. Avoid high and low blood sugars. Get adequate sleep, 7-8 hours a night. Needs exercise daily preferably in the morning. Started on Amlodipine 2.5 mg daily and may use Imitrex prn.

## 2015-03-22 NOTE — Progress Notes (Signed)
Gina Moreno  [id number] May 18, 1986 03/22/2015      Progress Note-Follow Up  Subjective  Chief Complaint  Chief Complaint  Patient presents with  . Follow-up    HPI  Patient is a 29 y.o. female in today for routine medical care. Flared the last 3 months, worse the week before and after her cycle. Gets Photophobia/phonophobia.  Some nausea no vomitting. No help with Ibuprofen. No new stressors. No recent illness or fevers. Denies CP/palp/SOB/congestion/fevers/GI or GU c/o. Taking meds as prescribed  Past Medical History  Diagnosis Date  . GERD (gastroesophageal reflux disease)   . Anxiety   . Hx of varicella   . Depression   . IBS (irritable bowel syndrome)   . Anemia 07/25/2013  . Esophageal reflux 07/25/2013  . Anxiety and depression 07/25/2013  . Preventative health care 07/25/2013  . Other and unspecified hyperlipidemia 07/25/2013  . RLS (restless legs syndrome) 05/20/2014  . Tachycardia 09/16/2014  . Migraine 03/22/2015    Past Surgical History  Procedure Laterality Date  . Wisdom tooth extraction  29 yrs old    Family History  Problem Relation Age of Onset  . Hyperlipidemia Father   . Arthritis Paternal Grandmother   . Cancer Paternal Grandmother     cervical  . Diabetes Paternal Grandmother   . Depression Paternal Grandmother   . Cancer Paternal Grandfather     lung stomach  . Thyroid disease Mother     History   Social History  . Marital Status: Married    Spouse Name: N/A  . Number of Children: N/A  . Years of Education: N/A   Occupational History  . Not on file.   Social History Main Topics  . Smoking status: Passive Smoke Exposure - Never Smoker  . Smokeless tobacco: Never Used  . Alcohol Use: No  . Drug Use: No  . Sexual Activity:    Partners: Male   Other Topics Concern  . Not on file   Social History Narrative    Current Outpatient Prescriptions on File Prior to Visit  Medication Sig Dispense Refill  . GILDESS FE 1/20 1-20 MG-MCG  tablet      No current facility-administered medications on file prior to visit.    Allergies  Allergen Reactions  . Doxycycline Rash    Review of Systems  Review of Systems  Constitutional: Negative for fever, chills and malaise/fatigue.  HENT: Negative for congestion, hearing loss and nosebleeds.   Eyes: Positive for photophobia. Negative for discharge.  Respiratory: Negative for cough, sputum production, shortness of breath and wheezing.   Cardiovascular: Negative for chest pain, palpitations and leg swelling.  Gastrointestinal: Negative for heartburn, nausea, vomiting, abdominal pain, diarrhea, constipation and blood in stool.  Genitourinary: Negative for dysuria, urgency, frequency and hematuria.  Musculoskeletal: Negative for myalgias, back pain and falls.  Skin: Negative for rash.  Neurological: Positive for headaches. Negative for dizziness, tremors, sensory change, focal weakness, loss of consciousness and weakness.  Endo/Heme/Allergies: Negative for polydipsia. Does not bruise/bleed easily.  Psychiatric/Behavioral: Negative for depression and suicidal ideas. The patient is nervous/anxious. The patient does not have insomnia.     Objective  BP 110/70 mmHg  Pulse 76  Temp(Src) 98.4 F (36.9 C) (Oral)  Wt 180 lb 8 oz (81.874 kg)  SpO2 98%  Physical Exam  Physical Exam  Constitutional: She is oriented to person, place, and time and well-developed, well-nourished, and in no distress. No distress.  HENT:  Head: Normocephalic and atraumatic.  Eyes: Conjunctivae  are normal.  Neck: Neck supple. No thyromegaly present.  Cardiovascular: Normal rate, regular rhythm and normal heart sounds.   No murmur heard. Pulmonary/Chest: Effort normal and breath sounds normal. She has no wheezes.  Abdominal: She exhibits no distension and no mass.  Musculoskeletal: She exhibits no edema.  Lymphadenopathy:    She has no cervical adenopathy.  Neurological: She is alert and oriented  to person, place, and time.  Skin: Skin is warm and dry. No rash noted. She is not diaphoretic.  Psychiatric: Memory, affect and judgment normal.    Lab Results  Component Value Date   TSH 1.49 09/16/2014   Lab Results  Component Value Date   WBC 9.8 08/27/2013   HGB 12.5 08/27/2013   HCT 37.5 08/27/2013   MCV 81.3 08/27/2013   PLT 338 08/27/2013   Lab Results  Component Value Date   CREATININE 0.60 07/23/2013   BUN 13 07/23/2013   NA 140 07/23/2013   K 4.1 07/23/2013   CL 105 07/23/2013   CO2 27 07/23/2013   Lab Results  Component Value Date   ALT 11 07/23/2013   AST 11 07/23/2013   ALKPHOS 73 07/23/2013   BILITOT 0.4 07/23/2013   Lab Results  Component Value Date   CHOL 210* 07/23/2013   Lab Results  Component Value Date   HDL 61 07/23/2013   Lab Results  Component Value Date   LDLCALC 119* 07/23/2013   Lab Results  Component Value Date   TRIG 151* 07/23/2013   Lab Results  Component Value Date   CHOLHDL 3.4 07/23/2013     Assessment & Plan  Esophageal reflux Avoid offending foods, take probiotics. Do not eat large meals in late evening and consider raising head of bed.   Tachycardia RRR today  Migraine Flared the last 3 months, worse the week before and after her cycle. Gets Photophobia/phonophobia.  Some nausea no vomitting. No help with Ibuprofen. Encouraged increased hydration, 64 ounces of clear fluids daily. Minimize alcohol and caffeine. Eat small frequent meals with lean proteins and complex carbs. Avoid high and low blood sugars. Get adequate sleep, 7-8 hours a night. Needs exercise daily preferably in the morning. Started on Amlodipine 2.5 mg daily and may use Imitrex prn.   Hyperlipidemia, mixed Encouraged heart healthy diet, increase exercise, avoid trans fats, consider a krill oil cap daily  Anxiety and depression Refilled Klonopin today.  Doing well on Fluoxetine

## 2015-03-22 NOTE — Progress Notes (Signed)
Pre visit review using our clinic review tool, if applicable. No additional management support is needed unless otherwise documented below in the visit note. 

## 2015-03-22 NOTE — Assessment & Plan Note (Signed)
RRR today 

## 2015-03-22 NOTE — Assessment & Plan Note (Signed)
Encouraged heart healthy diet, increase exercise, avoid trans fats, consider a krill oil cap daily 

## 2015-03-22 NOTE — Assessment & Plan Note (Addendum)
Refilled Klonopin today.  Doing well on Fluoxetine

## 2015-04-18 ENCOUNTER — Telehealth: Payer: Self-pay | Admitting: *Deleted

## 2015-04-18 NOTE — Telephone Encounter (Signed)
thx

## 2015-04-18 NOTE — Telephone Encounter (Signed)
Patient called stating she had two episodes of fainting over the weekend, both after taking Imitrex.  She had a migraine, took the Imitrex, and began to feel nauseated, shaky, and felt her heart racing.  She then passed out- was around friends who said they checked her pulse and it was OK.  Last episode was Saturday around 3:30.  She has not taken the medication since then and it has not happened.  She states she also takes amlodipine but this does not happen around that medication.  Scheduled appointment at 2:30 with Dr. Larose Kells 04/19/15 per patient preference (unable to schedule sooner due to patient's work schedule).  Notified patient that if she has another episode before appointment to go to ED.  She stated understanding and agreed.

## 2015-04-19 ENCOUNTER — Ambulatory Visit (INDEPENDENT_AMBULATORY_CARE_PROVIDER_SITE_OTHER): Payer: BLUE CROSS/BLUE SHIELD | Admitting: Internal Medicine

## 2015-04-19 ENCOUNTER — Other Ambulatory Visit: Payer: Self-pay | Admitting: Internal Medicine

## 2015-04-19 ENCOUNTER — Encounter: Payer: Self-pay | Admitting: Internal Medicine

## 2015-04-19 VITALS — BP 116/74 | HR 77 | Temp 98.3°F | Ht 67.5 in | Wt 180.5 lb

## 2015-04-19 DIAGNOSIS — R519 Headache, unspecified: Secondary | ICD-10-CM

## 2015-04-19 DIAGNOSIS — R51 Headache: Secondary | ICD-10-CM

## 2015-04-19 DIAGNOSIS — R55 Syncope and collapse: Secondary | ICD-10-CM | POA: Diagnosis not present

## 2015-04-19 MED ORDER — TOPIRAMATE 25 MG PO TABS: 50.0000 mg | ORAL_TABLET | Freq: Every day | ORAL | Status: DC

## 2015-04-19 NOTE — Progress Notes (Signed)
Pre visit review using our clinic review tool, if applicable. No additional management support is needed unless otherwise documented below in the visit note. 

## 2015-04-19 NOTE — Progress Notes (Signed)
Subjective:    Patient ID: Gina Moreno, female    DOB: 1985-09-03, 29 y.o.   MRN: [id number]  DOS:  04/19/2015 Type of visit - description : Acute Interval history: Having headaches on and off for years, more frequent for the last 8 months, headaches are associated with nausea, photophobia and phonophobia. Was seen 03-22-15 by PCP, started amlodipine for headache prevention and Imitrex for abortion. Since then she tried Imitrex 3 times, the first 2 times, got extremely dizzy for about an hour. The last time she took it was 3 days ago: She started shaking, had palpitations,   not feeling well at all and shortly after she found herself on the ground; the episode was witnessed and she regained consciousness quickly when her family started shaking her. A family member is an Therapist, sports, she was told her pulse was okay. No tonge bite , no B/B incontinence. She did have some i involuntary  movements but the family didn't think she had a seizure.  no further events, she continued to have headaches approximately 3 or 4 times a week even with amlodipine.    Review of Systems   no fever chills No neck stiffness No diplopia, slurred speech or motor deficits  Past Medical History  Diagnosis Date  . GERD (gastroesophageal reflux disease)   . Anxiety   . Hx of varicella   . Depression   . IBS (irritable bowel syndrome)   . Anemia 07/25/2013  . Esophageal reflux 07/25/2013  . Anxiety and depression 07/25/2013  . Preventative health care 07/25/2013  . Other and unspecified hyperlipidemia 07/25/2013  . RLS (restless legs syndrome) 05/20/2014  . Tachycardia 09/16/2014  . Migraine 03/22/2015    Past Surgical History  Procedure Laterality Date  . Wisdom tooth extraction  29 yrs old    Social History   Social History  . Marital Status: Married    Spouse Name: N/A  . Number of Children: N/A  . Years of Education: N/A   Occupational History  . Not on file.   Social History Main Topics  . Smoking  status: Passive Smoke Exposure - Never Smoker  . Smokeless tobacco: Never Used  . Alcohol Use: No  . Drug Use: No  . Sexual Activity:    Partners: Male   Other Topics Concern  . Not on file   Social History Narrative        Medication List       This list is accurate as of: 04/19/15 11:59 PM.  Always use your most recent med list.               clonazePAM 0.5 MG tablet  Commonly known as:  KLONOPIN  Take 1 tablet (0.5 mg total) by mouth daily as needed. for anxiety     DEXILANT 60 MG capsule  Generic drug:  dexlansoprazole  Take 1 capsule (60 mg total) by mouth daily.     FLUoxetine 40 MG capsule  Commonly known as:  PROZAC  Take 1 capsule (40 mg total) by mouth daily.     GILDESS FE 1/20 1-20 MG-MCG tablet  Generic drug:  norethindrone-ethinyl estradiol     topiramate 25 MG tablet  Commonly known as:  TOPAMAX  Take 2 tablets (50 mg total) by mouth at bedtime.           Objective:   Physical Exam BP 116/74 mmHg  Pulse 77  Temp(Src) 98.3 F (36.8 C) (Oral)  Ht 5' 7.5" (1.715 m)  Wt 180 lb 8 oz (81.874 kg)  BMI 27.84 kg/m2  SpO2 97%  LMP 03/21/2015 (Approximate) General:   Well developed, well nourished . NAD.  HEENT:  Normocephalic . Face symmetric, atraumatic Neck: Full range of motion Lungs:  CTA B Normal respiratory effort, no intercostal retractions, no accessory muscle use. Heart: RRR,  no murmur.  No pretibial edema bilaterally  Skin: Not pale. Not jaundice Neurologic:  alert & oriented X3.  Speech normal, gait appropriate for age and unassisted EOMI, pupils equal and reactive, DTRs and strength symmetric. Psych--  Cognition and judgment appear intact.  Cooperative with normal attention span and concentration.  Behavior appropriate. No anxious or depressed appearing.      Assessment & Plan:   Migraine headaches, syncope Dizziness and eventually syncope   clearly related with Imitrex intake. Neurological exam is now normal. She  continued having frequent headaches. Plan: Change amlodipine to Topamax for headache prevention Avoid Imitrex For acute treatment try Excedrin OTC MRI of the brain without, no urgent Call anytime if side effects from Topamax , severe headaches or another syncope. Follow-up visit in one month

## 2015-04-19 NOTE — Patient Instructions (Signed)
Stop amlodipine and Imitrex  Start taking Topamax 25 mg: The first 2 weeks take 1 tablet at bedtime, after 2 weeks take 2 tablets every bedtime.  For headaches, take OTC Excedrin as needed   Please come and see your primary doctor in one month

## 2015-05-04 ENCOUNTER — Ambulatory Visit
Admission: RE | Admit: 2015-05-04 | Discharge: 2015-05-04 | Disposition: A | Discharge status: Home / Self Care | Payer: BLUE CROSS/BLUE SHIELD | Source: Ambulatory Visit | Attending: Internal Medicine | Admitting: Internal Medicine

## 2015-05-17 ENCOUNTER — Encounter: Payer: Self-pay | Admitting: Family Medicine

## 2015-05-17 ENCOUNTER — Ambulatory Visit (INDEPENDENT_AMBULATORY_CARE_PROVIDER_SITE_OTHER): Payer: BLUE CROSS/BLUE SHIELD | Admitting: Family Medicine

## 2015-05-17 VITALS — BP 120/82 | HR 59 | Temp 98.4°F | Ht 67.0 in | Wt 176.4 lb

## 2015-05-17 DIAGNOSIS — R000 Tachycardia, unspecified: Secondary | ICD-10-CM

## 2015-05-17 DIAGNOSIS — G43809 Other migraine, not intractable, without status migrainosus: Secondary | ICD-10-CM

## 2015-05-17 DIAGNOSIS — K219 Gastro-esophageal reflux disease without esophagitis: Secondary | ICD-10-CM

## 2015-05-17 MED ORDER — PROMETHAZINE HCL 25 MG PO TABS: 25.0000 mg | ORAL_TABLET | Freq: Three times a day (TID) | ORAL | Status: DC | PRN

## 2015-05-17 MED ORDER — TOPIRAMATE 100 MG PO TABS: 100.0000 mg | ORAL_TABLET | Freq: Every day | ORAL | Status: DC

## 2015-05-17 NOTE — Progress Notes (Signed)
Pre visit review using our clinic review tool, if applicable. No additional management support is needed unless otherwise documented below in the visit note. 

## 2015-05-17 NOTE — Patient Instructions (Signed)

## 2015-05-22 ENCOUNTER — Encounter: Payer: Self-pay | Admitting: Family Medicine

## 2015-05-22 NOTE — Assessment & Plan Note (Signed)
RRR today 

## 2015-05-22 NOTE — Assessment & Plan Note (Addendum)
Encouraged increased hydration, 64 ounces of clear fluids daily. Minimize alcohol and caffeine. Eat small frequent meals with lean proteins and complex carbs. Avoid high and low blood sugars. Get adequate sleep, 7-8 hours a night. Needs exercise daily preferably in the morning. topamax helped some, continue the same but increase to 100 mg daily

## 2015-05-22 NOTE — Assessment & Plan Note (Signed)
Avoid offending foods, start probiotics. Do not eat large meals in late evening and consider raising head of bed.  

## 2015-05-22 NOTE — Progress Notes (Signed)
Subjective:    Patient ID: Gina Moreno, female    DOB: [birth year], 29 y.o.   MRN: [id number]  Chief Complaint  Patient presents with  . Migraine    HPI Patient is in today for follow-up on migraine. Has been having more of them and they're lasting longer. She denies any changes in her diet, exercise, sleep or stress levels. She notes when she takes the Imitrex she has a was a lightheaded feeling. Denies CP/palp/SOB/HA/congestion/fevers/GI or GU c/o. Taking meds as prescribed  Past Medical History  Diagnosis Date  . GERD (gastroesophageal reflux disease)   . Anxiety   . Hx of varicella   . Depression   . IBS (irritable bowel syndrome)   . Anemia 07/25/2013  . Esophageal reflux 07/25/2013  . Anxiety and depression 07/25/2013  . Preventative health care 07/25/2013  . Other and unspecified hyperlipidemia 07/25/2013  . RLS (restless legs syndrome) 05/20/2014  . Tachycardia 09/16/2014  . Migraine 03/22/2015    Past Surgical History  Procedure Laterality Date  . Wisdom tooth extraction  29 yrs old    Family History  Problem Relation Age of Onset  . Hyperlipidemia Father   . Arthritis Paternal Grandmother   . Cancer Paternal Grandmother     cervical  . Diabetes Paternal Grandmother   . Depression Paternal Grandmother   . Cancer Paternal Grandfather     lung stomach  . Thyroid disease Mother     Social History   Social History  . Marital Status: Married    Spouse Name: N/A  . Number of Children: N/A  . Years of Education: N/A   Occupational History  . Not on file.   Social History Main Topics  . Smoking status: Passive Smoke Exposure - Never Smoker  . Smokeless tobacco: Never Used  . Alcohol Use: No  . Drug Use: No  . Sexual Activity:    Partners: Male   Other Topics Concern  . Not on file   Social History Narrative    Outpatient Prescriptions Prior to Visit  Medication Sig Dispense Refill  . clonazePAM (KLONOPIN) 0.5 MG tablet Take 1 tablet (0.5 mg  total) by mouth daily as needed. for anxiety 30 tablet 3  . DEXILANT 60 MG capsule Take 1 capsule (60 mg total) by mouth daily. 90 capsule 1  . FLUoxetine (PROZAC) 40 MG capsule Take 1 capsule (40 mg total) by mouth daily. 90 capsule 1  . GILDESS FE 1/20 1-20 MG-MCG tablet     . topiramate (TOPAMAX) 25 MG tablet Take 2 tablets (50 mg total) by mouth at bedtime. 60 tablet 1   No facility-administered medications prior to visit.    Allergies  Allergen Reactions  . Imitrex [Sumatriptan]     Syncope  . Doxycycline Rash    Review of Systems  Constitutional: Negative for fever and malaise/fatigue.  HENT: Negative for congestion.   Eyes: Negative for discharge.  Respiratory: Negative for shortness of breath.   Cardiovascular: Negative for chest pain, palpitations and leg swelling.  Gastrointestinal: Negative for nausea and abdominal pain.  Genitourinary: Negative for dysuria.  Musculoskeletal: Negative for falls.  Skin: Negative for rash.  Neurological: Positive for headaches. Negative for loss of consciousness.  Endo/Heme/Allergies: Negative for environmental allergies.  Psychiatric/Behavioral: Negative for depression. The patient is not nervous/anxious.        Objective:    Physical Exam  Constitutional: She is oriented to person, place, and time. She appears well-developed and well-nourished. No distress.  HENT:  Head: Normocephalic and atraumatic.  Nose: Nose normal.  Eyes: Right eye exhibits no discharge. Left eye exhibits no discharge.  Neck: Normal range of motion. Neck supple.  Cardiovascular: Normal rate and regular rhythm.   No murmur heard. Pulmonary/Chest: Effort normal and breath sounds normal.  Abdominal: Soft. Bowel sounds are normal. There is no tenderness.  Musculoskeletal: She exhibits no edema.  Neurological: She is alert and oriented to person, place, and time.  Skin: Skin is warm and dry.  Psychiatric: She has a normal mood and affect.  Nursing note and  vitals reviewed.   BP 120/82 mmHg  Pulse 59  Temp(Src) 98.4 F (36.9 C) (Oral)  Ht 5\' 7"  (1.702 m)  Wt 176 lb 6 oz (80.003 kg)  BMI 27.62 kg/m2  SpO2 99%  LMP 04/20/2015 Wt Readings from Last 3 Encounters:  05/17/15 176 lb 6 oz (80.003 kg)  04/19/15 180 lb 8 oz (81.874 kg)  03/22/15 180 lb 8 oz (81.874 kg)     Lab Results  Component Value Date   WBC 9.2 03/22/2015   HGB 12.8 03/22/2015   HCT 38.8 03/22/2015   PLT 293.0 03/22/2015   GLUCOSE 102* 03/22/2015   CHOL 249* 03/22/2015   TRIG 141.0 03/22/2015   HDL 63.20 03/22/2015   LDLCALC 157* 03/22/2015   ALT 13 03/22/2015   AST 13 03/22/2015   NA 139 03/22/2015   K 3.6 03/22/2015   CL 105 03/22/2015   CREATININE 0.65 03/22/2015   BUN 12 03/22/2015   CO2 29 03/22/2015   TSH 1.44 03/22/2015    Lab Results  Component Value Date   TSH 1.44 03/22/2015   Lab Results  Component Value Date   WBC 9.2 03/22/2015   HGB 12.8 03/22/2015   HCT 38.8 03/22/2015   MCV 85.6 03/22/2015   PLT 293.0 03/22/2015   Lab Results  Component Value Date   NA 139 03/22/2015   K 3.6 03/22/2015   CO2 29 03/22/2015   GLUCOSE 102* 03/22/2015   BUN 12 03/22/2015   CREATININE 0.65 03/22/2015   BILITOT 0.4 03/22/2015   ALKPHOS 65 03/22/2015   AST 13 03/22/2015   ALT 13 03/22/2015   PROT 7.5 03/22/2015   ALBUMIN 4.0 03/22/2015   CALCIUM 9.2 03/22/2015   GFR 116.41 03/22/2015   Lab Results  Component Value Date   CHOL 249* 03/22/2015   Lab Results  Component Value Date   HDL 63.20 03/22/2015   Lab Results  Component Value Date   LDLCALC 157* 03/22/2015   Lab Results  Component Value Date   TRIG 141.0 03/22/2015   Lab Results  Component Value Date   CHOLHDL 4 03/22/2015   No results found for: HGBA1C     Assessment & Plan:   Problem List Items Addressed This Visit    Tachycardia - Primary    RRR today      Migraine    Encouraged increased hydration, 64 ounces of clear fluids daily. Minimize alcohol and  caffeine. Eat small frequent meals with lean proteins and complex carbs. Avoid high and low blood sugars. Get adequate sleep, 7-8 hours a night. Needs exercise daily preferably in the morning. topamax helped some, continue the same but increase to 100 mg daily      Relevant Medications   topiramate (TOPAMAX) 100 MG tablet   Esophageal reflux    Avoid offending foods, start probiotics. Do not eat large meals in late evening and consider raising head of bed.  I have discontinued Ms. Detter's topiramate. I am also having her start on topiramate and promethazine. Additionally, I am having her maintain her GILDESS FE 1/20, DEXILANT, FLUoxetine, and clonazePAM.  Meds ordered this encounter  Medications  . topiramate (TOPAMAX) 100 MG tablet    Sig: Take 1 tablet (100 mg total) by mouth at bedtime.    Dispense:  30 tablet    Refill:  3  . promethazine (PHENERGAN) 25 MG tablet    Sig: Take 1 tablet (25 mg total) by mouth every 8 (eight) hours as needed for nausea or vomiting.    Dispense:  20 tablet    Refill:  0     Penni Homans, MD

## 2015-05-28 ENCOUNTER — Encounter: Payer: Self-pay | Admitting: Family Medicine

## 2015-05-31 ENCOUNTER — Other Ambulatory Visit: Payer: Self-pay | Admitting: Family Medicine

## 2015-05-31 MED ORDER — ONDANSETRON HCL 4 MG PO TABS: 4.0000 mg | ORAL_TABLET | Freq: Two times a day (BID) | ORAL | Status: DC

## 2015-05-31 MED ORDER — TOPIRAMATE 50 MG PO TABS: 50.0000 mg | ORAL_TABLET | Freq: Every day | ORAL | Status: DC

## 2015-06-07 ENCOUNTER — Other Ambulatory Visit: Payer: Self-pay | Admitting: Family Medicine

## 2015-06-07 MED ORDER — FLUOXETINE HCL 20 MG PO CAPS: 20.0000 mg | ORAL_CAPSULE | Freq: Three times a day (TID) | ORAL | Status: DC

## 2015-06-07 NOTE — Telephone Encounter (Signed)
Updated medication list per [employer].

## 2015-06-14 ENCOUNTER — Other Ambulatory Visit: Payer: Self-pay | Admitting: Obstetrics & Gynecology

## 2015-06-14 DIAGNOSIS — N6310 Unspecified lump in the right breast, unspecified quadrant: Secondary | ICD-10-CM

## 2015-06-15 ENCOUNTER — Other Ambulatory Visit: Payer: Self-pay

## 2015-06-17 ENCOUNTER — Ambulatory Visit
Admission: RE | Admit: 2015-06-17 | Discharge: 2015-06-17 | Disposition: A | Discharge status: Home / Self Care | Payer: BLUE CROSS/BLUE SHIELD | Source: Ambulatory Visit | Attending: Obstetrics & Gynecology | Admitting: Obstetrics & Gynecology

## 2015-06-17 DIAGNOSIS — N6310 Unspecified lump in the right breast, unspecified quadrant: Secondary | ICD-10-CM

## 2015-07-19 ENCOUNTER — Telehealth: Payer: Self-pay | Admitting: Family Medicine

## 2015-07-19 NOTE — Telephone Encounter (Signed)
Pt declined scheduling flu shot. She said she'll just go to [facility] some time.

## 2015-09-07 ENCOUNTER — Telehealth: Payer: Self-pay | Admitting: Family Medicine

## 2015-09-07 NOTE — Telephone Encounter (Signed)
She can wait til check up on 2/27 if she is doing OK, can add some zantac if not already taking if the reflux is bad. minimze spicy and acidic foods

## 2015-09-07 NOTE — Telephone Encounter (Signed)
Called work phone.  No answer.  Called mobile and left a message on voicemail.

## 2015-09-07 NOTE — Telephone Encounter (Signed)
Pt was due to follow up with Dr. Charlett Blake in November.  No appt was scheduled.   States acid reflux is getting worse since she increased Prozac otherwise no other problems identified.    Please advise.

## 2015-09-07 NOTE — Telephone Encounter (Signed)
Pt called to scheduled f/u on dose change of Prozac and acid reflux. Pt is scheduled for cpe 10/11/15. Next available appt is 2/16. Pt is requesting call at work. She is there all day until 6pm.

## 2015-09-12 NOTE — Telephone Encounter (Signed)
Called patient and made her aware of provider's recommendation.  She stated understanding and agreed with plan.  No further needs voiced at this time.

## 2015-09-28 ENCOUNTER — Ambulatory Visit (INDEPENDENT_AMBULATORY_CARE_PROVIDER_SITE_OTHER): Payer: BLUE CROSS/BLUE SHIELD | Admitting: Medical

## 2015-09-28 ENCOUNTER — Telehealth: Payer: Self-pay

## 2015-09-28 ENCOUNTER — Encounter: Payer: Self-pay | Admitting: Medical

## 2015-09-28 ENCOUNTER — Ambulatory Visit (HOSPITAL_BASED_OUTPATIENT_CLINIC_OR_DEPARTMENT_OTHER)
Admission: RE | Admit: 2015-09-28 | Discharge: 2015-09-28 | Disposition: A | Discharge status: Home / Self Care | Payer: BLUE CROSS/BLUE SHIELD | Source: Ambulatory Visit | Attending: Medical | Admitting: Medical

## 2015-09-28 VITALS — BP 124/78 | HR 88 | Temp 98.0°F | Ht 67.0 in | Wt 176.2 lb

## 2015-09-28 DIAGNOSIS — R109 Unspecified abdominal pain: Secondary | ICD-10-CM

## 2015-09-28 DIAGNOSIS — R110 Nausea: Secondary | ICD-10-CM | POA: Diagnosis not present

## 2015-09-28 DIAGNOSIS — R1011 Right upper quadrant pain: Secondary | ICD-10-CM | POA: Diagnosis present

## 2015-09-28 DIAGNOSIS — K8020 Calculus of gallbladder without cholecystitis without obstruction: Secondary | ICD-10-CM | POA: Diagnosis not present

## 2015-09-28 LAB — CBC WITH DIFFERENTIAL/PLATELET
BASOS PCT: 0.3 % (ref 0.0–3.0)
Basophils Absolute: 0 10*3/uL (ref 0.0–0.1)
EOS ABS: 0.2 10*3/uL (ref 0.0–0.7)
Eosinophils Relative: 2.2 % (ref 0.0–5.0)
HEMATOCRIT: 39.1 % (ref 36.0–46.0)
Hemoglobin: 13 g/dL (ref 12.0–15.0)
Lymphocytes Relative: 36.7 % (ref 12.0–46.0)
Lymphs Abs: 3 10*3/uL (ref 0.7–4.0)
MCHC: 33.3 g/dL (ref 30.0–36.0)
MCV: 85.2 fl (ref 78.0–100.0)
MONO ABS: 0.5 10*3/uL (ref 0.1–1.0)
Monocytes Relative: 5.9 % (ref 3.0–12.0)
NEUTROS ABS: 4.5 10*3/uL (ref 1.4–7.7)
Neutrophils Relative %: 54.9 % (ref 43.0–77.0)
PLATELETS: 330 10*3/uL (ref 150.0–400.0)
RBC: 4.59 Mil/uL (ref 3.87–5.11)
RDW: 14.8 % (ref 11.5–15.5)
WBC: 8.2 10*3/uL (ref 4.0–10.5)

## 2015-09-28 LAB — COMPREHENSIVE METABOLIC PANEL
ALT: 17 U/L (ref 0–35)
AST: 11 U/L (ref 0–37)
Albumin: 3.9 g/dL (ref 3.5–5.2)
Alkaline Phosphatase: 60 U/L (ref 39–117)
BUN: 12 mg/dL (ref 6–23)
CALCIUM: 9.1 mg/dL (ref 8.4–10.5)
CO2: 27 meq/L (ref 19–32)
CREATININE: 0.67 mg/dL (ref 0.40–1.20)
Chloride: 105 mEq/L (ref 96–112)
GFR: 111.97 mL/min (ref >60.00)
Glucose, Bld: 108 mg/dL — ABNORMAL HIGH (ref 70–99)
Potassium: 3.9 mEq/L (ref 3.5–5.1)
Sodium: 138 mEq/L (ref 135–145)
Total Bilirubin: 0.4 mg/dL (ref 0.2–1.2)
Total Protein: 7.5 g/dL (ref 6.0–8.3)

## 2015-09-28 LAB — LIPASE: LIPASE: 16 U/L (ref 11.0–59.0)

## 2015-09-28 LAB — AMYLASE: Amylase: 35 U/L (ref 27–131)

## 2015-09-28 NOTE — Patient Instructions (Addendum)
For your abdomen pain. Continue the dexilant and zantac. But eat healthy diet.  Your pain does seem to be worse than your baseline reflux type pain. Will get cbc, cmp, amylase, lipase, h pylori test and go ahead and put abd [place] order request in.(notified pt considering gallbladder etiology)  If pending above work up increase of pain/severe type then ED evaluation.  Follow up in 7-10 days or as needed

## 2015-09-28 NOTE — Progress Notes (Signed)
Pre visit review using our clinic review tool, if applicable. No additional management support is needed unless otherwise documented below in the visit note. 

## 2015-09-28 NOTE — Telephone Encounter (Signed)
Will you look at referral. Can she be seen on Friday or maybe tomorrow/thursday.

## 2015-09-28 NOTE — Progress Notes (Signed)
Subjective:    Patient ID: Gina Moreno, female    DOB: [birth year], 30 y.o.   MRN: [id number]  HPI  Pt in with epigastric pain. She states as long as she can remember she had refux. Pt states since child. Pt is on dexilant.   Pt states pain for 5 wks that is worse than regular reflux pain. Pt states she tried zantac in addition to dexilant. She sent my chart message and advised to try zantac by Dr. Charlett Blake.  Pain is often worse after meals. Pt does admit to eating greasy/fatty foods.  LMP- 3 wks ago.  Pt feels some chills and mild warm at times. But no definite. Some nausea with pain but no vomiting.   Pt has not been drinking alcohol  Note no current flare of her abdomen pain.   Review of Systems  Constitutional: Positive for chills. Negative for fever and fatigue.  Respiratory: Negative for cough, shortness of breath and wheezing.   Cardiovascular: Negative for chest pain and palpitations.  Gastrointestinal: Positive for nausea and abdominal pain. Negative for vomiting, diarrhea, constipation, blood in stool, abdominal distention and rectal pain.  Genitourinary: Negative for dysuria, urgency, frequency and flank pain.  Musculoskeletal: Negative for back pain.  Skin: Negative for rash.  Hematological: Negative for adenopathy. Does not bruise/bleed easily.  Psychiatric/Behavioral: Negative for behavioral problems, confusion and sleep disturbance.   Past Medical History  Diagnosis Date  . GERD (gastroesophageal reflux disease)   . Anxiety   . Hx of varicella   . Depression   . IBS (irritable bowel syndrome)   . Anemia 07/25/2013  . Esophageal reflux 07/25/2013  . Anxiety and depression 07/25/2013  . Preventative health care 07/25/2013  . Other and unspecified hyperlipidemia 07/25/2013  . RLS (restless legs syndrome) 05/20/2014  . Tachycardia 09/16/2014  . Migraine 03/22/2015    Social History   Social History  . Marital Status: Married    Spouse Name: N/A  . Number of  Children: N/A  . Years of Education: N/A   Occupational History  . Not on file.   Social History Main Topics  . Smoking status: Passive Smoke Exposure - Never Smoker  . Smokeless tobacco: Never Used  . Alcohol Use: No  . Drug Use: No  . Sexual Activity:    Partners: Male   Other Topics Concern  . Not on file   Social History Narrative    Past Surgical History  Procedure Laterality Date  . Wisdom tooth extraction  30 yrs old    Family History  Problem Relation Age of Onset  . Hyperlipidemia Father   . Arthritis Paternal Grandmother   . Cancer Paternal Grandmother     cervical  . Diabetes Paternal Grandmother   . Depression Paternal Grandmother   . Cancer Paternal Grandfather     lung stomach  . Thyroid disease Mother     Allergies  Allergen Reactions  . Imitrex [Sumatriptan]     Syncope  . Phenergan [Promethazine Hcl]   . Doxycycline Rash    Current Outpatient Prescriptions on File Prior to Visit  Medication Sig Dispense Refill  . DEXILANT 60 MG capsule Take 1 capsule (60 mg total) by mouth daily. 90 capsule 1  . FLUoxetine (PROZAC) 20 MG capsule TAKE 1 CAPSULE(20 MG) BY MOUTH THREE TIMES DAILY 270 capsule 1  . GILDESS FE 1/20 1-20 MG-MCG tablet     . clonazePAM (KLONOPIN) 0.5 MG tablet Take 1 tablet (0.5 mg total) by mouth daily  as needed. for anxiety (Patient not taking: Reported on 09/28/2015) 30 tablet 3   No current facility-administered medications on file prior to visit.    BP 124/78 mmHg  Pulse 88  Temp(Src) 98 F (36.7 C) (Oral)  Ht 5\' 7"  (1.702 m)  Wt 176 lb 3.2 oz (79.924 kg)  BMI 27.59 kg/m2  SpO2 98%       Objective:   Physical Exam  General Appearance- Not in acute distress.  HEENT Eyes- Scleraeral/Conjuntiva-bilat- Not Yellow. Mouth & Throat- Normal.  Chest and Lung Exam Auscultation: Breath sounds:-Normal. Adventitious sounds:- No Adventitious sounds.  Cardiovascular Auscultation:Rythm - Regular. Heart Sounds -Normal  heart sounds.  Abdomen Inspection:-Inspection Normal.  Palpation/Perucssion: Palpation and Percussion of the abdomen reveal- Non Tender, No Rebound tenderness, No rigidity(Guarding) and No Palpable abdominal masses.  Liver:-Normal.  Spleen:- Normal.   Back- no cva tenderness        Assessment & Plan:  For your abdomen pain. Continue the dexilant and zantac. But eat healthy diet.  Your pain does seem to be worse than your baseline reflux type pain. Will get cbc, cmp, amylase, lipase, h pylori test and go ahead and put abd [place] order request in.  If pending above work up increase of pain/severe type then ED evaluation.  Follow up in 7-10 days or as needed

## 2015-09-28 NOTE — Telephone Encounter (Signed)
IMPRESSION: 1. Cholelithiasis without significant gallbladder wall thickening or pericholecystic fluid, but with a positive sonographic Murphy sign. Image 3 of the gallstones are located within the gallbladder neck. The findings are concerning for, but not diagnostic of acute Cholecystitis.  Results per [name], PA-C: Multiple gallstones without infection.  Per [place], make patient aware of results, referral will be placed for surgeon, pending WBC-may start on antibiotic, if experiencing severe pain go to ER.    Pt made aware.  She stated understanding and agreed to comply.

## 2015-09-29 LAB — H. PYLORI BREATH TEST: H. pylori Breath Test: NOT DETECTED

## 2015-09-29 NOTE — Telephone Encounter (Signed)
Thanks for working on it fast.

## 2015-09-29 NOTE — Telephone Encounter (Signed)
Have sent the referral notes ins and [place] results to [facility] [place] [facility] with a message asking for an urgent appointment

## 2015-10-04 ENCOUNTER — Encounter: Payer: Self-pay | Admitting: Family Medicine

## 2015-10-10 ENCOUNTER — Ambulatory Visit: Payer: Self-pay | Admitting: General Surgery

## 2015-10-10 NOTE — H&P (Signed)
Gina Moreno 10/03/2015 9:24 AM Location: [facility] Patient #: [id number] DOB: 1986-01-03 Married / Language: English / Race: White Female   History of Present Illness [name] M. Myron Stankovich MD; 10/03/2015 9:59 AM) The patient is a 30 year old female who presents for evaluation of gall stones. She is referred by [name], PA-C for evaluation of gallstones and RUQ pain. She states a several month history of epigastric and right upper quadrant pain that radiates to her shoulder. This started happening intermittently around new years. The pain will last several hours. She will often have nausea with it. She denies any diarrhea or constipation, weight loss, acholic stools. She does take medication for reflux. The reflux medication will help but it will not take care of all of the discomfort. She has noticed a correlation with greasy fatty foods   Problem List/Past Medical [name] Ronnie Derby, MD; 10/03/2015 9:59 AM) SYMPTOMATIC CHOLELITHIASIS (K80.20)  Other Problems [name], MD; 10/03/2015 9:59 AM) Anxiety Disorder Depression Gastroesophageal Reflux Disease Heart murmur Migraine Headache  Past Surgical History [name], CMA; 10/03/2015 9:31 AM) Oral Surgery  Diagnostic Studies History [name], CMA; 10/03/2015 9:31 AM) Colonoscopy never Mammogram never Pap Smear 1-5 years ago  Allergies [name], CMA; 10/03/2015 9:32 AM) Imitrex *MIGRAINE PRODUCTS* Phenergan *ANTIHISTAMINES* Doxycycline Hyclate *Tetracyclines**  Medication History [name], CMA; 10/03/2015 9:32 AM) Dexilant (60MG  Capsule DR, Oral) Active. FLUoxetine HCl (40MG  Capsule, Oral) Active. Microgestin FE 1/20 (1-20MG -MCG Tablet, Oral) Active. Medications Reconciled  Social History [name], [place]; 10/03/2015 9:31 AM) Alcohol use Occasional alcohol use. No caffeine use No drug use Tobacco use Never smoker.  Family History [name], [place]; 10/03/2015 9:31  AM) Arthritis Family Members In General. Cervical Cancer Family Members In General. Colon Cancer Family Members In General. Depression Family Members In General. Diabetes Mellitus Family Members In General. Ovarian Cancer Family Members In General. Thyroid problems Mother.  Pregnancy / Birth History [name], CMA; 10/03/2015 9:31 AM) Age at menarche 74 years. Contraceptive History Oral contraceptives. Gravida 1 Maternal age 31-25 Para 1 Regular periods  History Addendum Note(Alaa Mullally M. Belem Hintze MD; 10/03/2015 10:01 AM) She had an abdominal ultrasound that showed gallstones without wall thickening. Her common bile duct was normal. She had blood work done on February 8 which showed a normal CBC and a normal comprehensive metabolic panel. Her amylase and lipase were normal.    Review of Systems [name] CMA; 10/03/2015 9:31 AM) General Present- Chills. Not Present- Appetite Loss, Fatigue, Fever, Night Sweats, Weight Gain and Weight Loss. Skin Not Present- Change in Wart/Mole, Dryness, Hives, Jaundice, New Lesions, Non-Healing Wounds, Rash and Ulcer. HEENT Not Present- Earache, Hearing Loss, Hoarseness, Nose Bleed, Oral Ulcers, Ringing in the Ears, Seasonal Allergies, Sinus Pain, Sore Throat, Visual Disturbances, Wears glasses/contact lenses and Yellow Eyes. Respiratory Not Present- Bloody sputum, Chronic Cough, Difficulty Breathing, Snoring and Wheezing. Breast Not Present- Breast Mass, Breast Pain, Nipple Discharge and Skin Changes. Cardiovascular Not Present- Chest Pain, Difficulty Breathing Lying Down, Leg Cramps, Palpitations, Rapid Heart Rate, Shortness of Breath and Swelling of Extremities. Gastrointestinal Present- Indigestion and Nausea. Not Present- Abdominal Pain, Bloating, Bloody Stool, Change in Bowel Habits, Chronic diarrhea, Constipation, Difficulty Swallowing, Excessive gas, Gets full quickly at meals, Hemorrhoids, Rectal Pain and Vomiting. Female  Genitourinary Not Present- Frequency, Nocturia, Painful Urination, Pelvic Pain and Urgency. Musculoskeletal Not Present- Back Pain, Joint Pain, Joint Stiffness, Muscle Pain, Muscle Weakness and Swelling of Extremities. Neurological Present- Headaches. Not Present- Decreased Memory, Fainting, Numbness, Seizures,  Tingling, Tremor, Trouble walking and Weakness. Psychiatric Present- Anxiety. Not Present- Bipolar, Change in Sleep Pattern, Depression, Fearful and Frequent crying. Endocrine Not Present- Cold Intolerance, Excessive Hunger, Hair Changes, Heat Intolerance, Hot flashes and New Diabetes. Hematology Not Present- Easy Bruising, Excessive bleeding, Gland problems, HIV and Persistent Infections.  Vitals [name] CMA; 10/03/2015 9:32 AM) 10/03/2015 9:32 AM Weight: 174 lb Height: 67in Body Surface Area: 1.91 m Body Mass Index: 27.25 kg/m  Temp.: 98.24F(Temporal)  Pulse: 119 (Regular)  BP: 126/78 (Sitting, Left Arm, Standard)       Physical Exam [name] M. Tiondra Fang MD; 10/03/2015 9:56 AM) General Mental Status-Alert. General Appearance-Consistent with stated age. Hydration-Well hydrated. Voice-Normal.  Head and Neck Head-normocephalic, atraumatic with no lesions or palpable masses. Trachea-midline. Thyroid Gland Characteristics - normal size and consistency.  Eye Eyeball - Bilateral-Extraocular movements intact. Sclera/Conjunctiva - Bilateral-No scleral icterus.  Chest and Lung Exam Chest and lung exam reveals -quiet, even and easy respiratory effort with no use of accessory muscles and on auscultation, normal breath sounds, no adventitious sounds and normal vocal resonance. Inspection Chest Wall - Normal. Back - normal.  Breast - Did not examine.  Cardiovascular Cardiovascular examination reveals -normal heart sounds, regular rate and rhythm with no murmurs and normal pedal pulses bilaterally.  Abdomen Inspection Inspection of the abdomen  reveals - No Hernias. Skin - Scar - no surgical scars. Palpation/Percussion Palpation and Percussion of the abdomen reveal - Soft, Non Tender, No Rebound tenderness, No Rigidity (guarding) and No hepatosplenomegaly. Auscultation Auscultation of the abdomen reveals - Bowel sounds normal.  Peripheral Vascular Upper Extremity Palpation - Pulses bilaterally normal.  Neurologic Neurologic evaluation reveals -alert and oriented x 3 with no impairment of recent or remote memory. Mental Status-Normal.  Neuropsychiatric The patient's mood and affect are described as -normal. Judgment and Insight-insight is appropriate concerning matters relevant to self.  Musculoskeletal Normal Exam - Left-Upper Extremity Strength Normal and Lower Extremity Strength Normal. Normal Exam - Right-Upper Extremity Strength Normal and Lower Extremity Strength Normal.  Lymphatic Head & Neck  General Head & Neck Lymphatics: Bilateral - Description - Normal. Axillary - Did not examine. Femoral & Inguinal - Did not examine.    Assessment & Plan [name] M. Jasaiah Karwowski MD; 10/03/2015 9:56 AM) SYMPTOMATIC CHOLELITHIASIS (K80.20) Impression: I believe the patient's symptoms are consistent with gallbladder disease.  We discussed gallbladder disease. The patient was given Neurosurgeon. We discussed non-operative and operative management. We discussed the signs & symptoms of acute cholecystitis  I discussed laparoscopic cholecystectomy with IOC in detail. The patient was given educational material as well as diagrams detailing the procedure. We discussed the risks and benefits of a laparoscopic cholecystectomy including, but not limited to bleeding, infection, injury to surrounding structures such as the intestine or liver, bile leak, retained gallstones, need to convert to an open procedure, prolonged diarrhea, blood clots such as DVT, common bile duct injury, anesthesia risks, and possible need for  additional procedures. We discussed the typical post-operative recovery course. I explained that the likelihood of improvement of their symptoms is good.  The patient has elected to proceed with surgery. Current Plans Pt Education - Pamphlet Given - Laparoscopic Gallbladder Surgery: discussed with patient and provided information. You are being scheduled for surgery - Our schedulers will call you.  You should hear from our office's scheduling department within 5 working days about the location, date, and time of surgery. We try to make accommodations for patient's preferences in scheduling surgery, but sometimes the OR schedule  or the surgeon's schedule prevents [place] from making those accommodations.  If you have not heard from our office [phone]) in 5 working days, call the office and ask for your surgeon's nurse.  If you have other questions about your diagnosis, plan, or surgery, call the office and ask for your surgeon's nurse.  Pt Education - [facility] Laparosopic Post Op HCI ([place])  Leighton Ruff. Redmond Pulling, MD, FACS General, Bariatric, & Minimally Invasive Surgery [facility], [place]

## 2015-10-11 ENCOUNTER — Encounter: Payer: Self-pay | Admitting: Family Medicine

## 2015-10-19 ENCOUNTER — Encounter (HOSPITAL_COMMUNITY): Payer: Self-pay

## 2015-10-19 ENCOUNTER — Encounter (HOSPITAL_COMMUNITY)
Admission: RE | Admit: 2015-10-19 | Discharge: 2015-10-19 | Disposition: A | Discharge status: Home / Self Care | Payer: BLUE CROSS/BLUE SHIELD | Source: Ambulatory Visit | Attending: General Surgery | Admitting: General Surgery

## 2015-10-19 DIAGNOSIS — Z01812 Encounter for preprocedural laboratory examination: Secondary | ICD-10-CM | POA: Diagnosis present

## 2015-10-19 DIAGNOSIS — K8020 Calculus of gallbladder without cholecystitis without obstruction: Secondary | ICD-10-CM | POA: Diagnosis not present

## 2015-10-19 HISTORY — DX: Cardiac murmur, unspecified: R01.1

## 2015-10-19 LAB — PREGNANCY, URINE: Preg Test, Ur: NEGATIVE

## 2015-10-19 NOTE — Patient Instructions (Signed)
Gina Moreno  10/19/2015   Your procedure is scheduled on: Tuesday October 25, 2015   Report to [facility] Main  Entrance take [place]  elevators to 3rd floor to  Centre at 5:30 AM.  Call this number if you have problems the morning of surgery [phone]   Remember: ONLY 1 PERSON MAY GO WITH YOU TO SHORT STAY TO GET  READY MORNING OF War.  Do not eat food or drink liquids :After Midnight.     Take these medicines the morning of surgery with A SIP OF WATER: Fluoxetine (Prozac); Dexilant              You may not have any metal on your body including hair pins and              piercings  Do not wear jewelry, make-up, lotions, powders or perfumes, deodorant             Do not wear nail polish.  Do not shave  48 hours prior to surgery.               Do not bring valuables to the hospital. [place].  Contacts, dentures or bridgework may not be worn into surgery.  Leave suitcase in the car. After surgery it may be brought to your room.   _____________________________________________________________________             [facility] - Preparing for Surgery Before surgery, you can play an important role.  Because skin is not sterile, your skin needs to be as free of germs as possible.  You can reduce the number of germs on your skin by washing with CHG (chlorahexidine gluconate) soap before surgery.  CHG is an antiseptic cleaner which kills germs and bonds with the skin to continue killing germs even after washing. Please DO NOT use if you have an allergy to CHG or antibacterial soaps.  If your skin becomes reddened/irritated stop using the CHG and inform your nurse when you arrive at Short Stay. Do not shave (including legs and underarms) for at least 48 hours prior to the first CHG shower.  You may shave your face/neck. Please follow these instructions carefully:  1.  Shower with CHG Soap the night  before surgery and the  morning of Surgery.  2.  If you choose to wash your hair, wash your hair first as usual with your  normal  shampoo.  3.  After you shampoo, rinse your hair and body thoroughly to remove the  shampoo.                           4.  Use CHG as you would any other liquid soap.  You can apply chg directly  to the skin and wash                       Gently with a scrungie or clean washcloth.  5.  Apply the CHG Soap to your body ONLY FROM THE NECK DOWN.   Do not use on face/ open                           Wound or  open sores. Avoid contact with eyes, ears mouth and genitals (private parts).                       Wash face,  Genitals (private parts) with your normal soap.             6.  Wash thoroughly, paying special attention to the area where your surgery  will be performed.  7.  Thoroughly rinse your body with warm water from the neck down.  8.  DO NOT shower/wash with your normal soap after using and rinsing off  the CHG Soap.                9.  Pat yourself dry with a clean towel.            10.  Wear clean pajamas.            11.  Place clean sheets on your bed the night of your first shower and do not  sleep with pets. Day of Surgery : Do not apply any lotions/deodorants the morning of surgery.  Please wear clean clothes to the hospital/surgery center.  FAILURE TO FOLLOW THESE INSTRUCTIONS MAY RESULT IN THE CANCELLATION OF YOUR SURGERY PATIENT SIGNATURE_________________________________  NURSE SIGNATURE__________________________________  ________________________________________________________________________

## 2015-10-19 NOTE — Progress Notes (Signed)
CBCD and CMP results in epic 09/28/2015

## 2015-10-24 NOTE — Anesthesia Preprocedure Evaluation (Signed)
Anesthesia Evaluation  Patient identified by MRN, date of birth, ID band Patient awake    Reviewed: Allergy & Precautions, NPO status , Patient's Chart, lab work & pertinent test results  Airway Mallampati: II  TM Distance: >3 FB Neck ROM: Full    Dental no notable dental hx.    Pulmonary neg pulmonary ROS,    Pulmonary exam normal breath sounds clear to auscultation       Cardiovascular negative cardio ROS Normal cardiovascular exam Rhythm:Regular Rate:Normal     Neuro/Psych Anxiety negative neurological ROS     GI/Hepatic Neg liver ROS, GERD  Medicated,  Endo/Other  negative endocrine ROS  Renal/GU negative Renal ROS  negative genitourinary   Musculoskeletal negative musculoskeletal ROS (+)   Abdominal   Peds negative pediatric ROS (+)  Hematology negative hematology ROS (+)   Anesthesia Other Findings   Reproductive/Obstetrics negative OB ROS                             Anesthesia Physical Anesthesia Plan  ASA: II  Anesthesia Plan: General   Post-op Pain Management:    Induction: Intravenous  Airway Management Planned: Oral ETT  Additional Equipment:   Intra-op Plan:   Post-operative Plan: Extubation in OR  Informed Consent: I have reviewed the patients History and Physical, chart, labs and discussed the procedure including the risks, benefits and alternatives for the proposed anesthesia with the patient or authorized representative who has indicated his/her understanding and acceptance.   Dental advisory given  Plan Discussed with: CRNA and Surgeon  Anesthesia Plan Comments:         Anesthesia Quick Evaluation

## 2015-10-25 ENCOUNTER — Ambulatory Visit (HOSPITAL_COMMUNITY): Payer: BLUE CROSS/BLUE SHIELD | Admitting: Anesthesiology

## 2015-10-25 ENCOUNTER — Ambulatory Visit (HOSPITAL_COMMUNITY)
Admission: RE | Admit: 2015-10-25 | Discharge: 2015-10-25 | Disposition: A | Discharge status: Home / Self Care | Payer: BLUE CROSS/BLUE SHIELD | Source: Ambulatory Visit | Attending: General Surgery | Admitting: General Surgery

## 2015-10-25 ENCOUNTER — Encounter (HOSPITAL_COMMUNITY)
Admission: RE | Disposition: A | Discharge status: Home / Self Care | Payer: Self-pay | Source: Ambulatory Visit | Attending: General Surgery

## 2015-10-25 ENCOUNTER — Encounter (HOSPITAL_COMMUNITY): Payer: Self-pay | Admitting: *Deleted

## 2015-10-25 DIAGNOSIS — F329 Major depressive disorder, single episode, unspecified: Secondary | ICD-10-CM | POA: Diagnosis not present

## 2015-10-25 DIAGNOSIS — Z79899 Other long term (current) drug therapy: Secondary | ICD-10-CM | POA: Diagnosis not present

## 2015-10-25 DIAGNOSIS — Z793 Long term (current) use of hormonal contraceptives: Secondary | ICD-10-CM | POA: Diagnosis not present

## 2015-10-25 DIAGNOSIS — K219 Gastro-esophageal reflux disease without esophagitis: Secondary | ICD-10-CM | POA: Diagnosis not present

## 2015-10-25 DIAGNOSIS — K8010 Calculus of gallbladder with chronic cholecystitis without obstruction: Secondary | ICD-10-CM | POA: Insufficient documentation

## 2015-10-25 DIAGNOSIS — K8020 Calculus of gallbladder without cholecystitis without obstruction: Secondary | ICD-10-CM | POA: Diagnosis present

## 2015-10-25 HISTORY — PX: CHOLECYSTECTOMY: SHX55

## 2015-10-25 SURGERY — LAPAROSCOPIC CHOLECYSTECTOMY
Anesthesia: General | Site: Abdomen

## 2015-10-25 MED ORDER — GLYCOPYRROLATE 0.2 MG/ML IJ SOLN
INTRAMUSCULAR | Status: AC
  Filled 2015-10-25: qty 3

## 2015-10-25 MED ORDER — KETOROLAC TROMETHAMINE 30 MG/ML IJ SOLN
INTRAMUSCULAR | Status: AC
  Filled 2015-10-25: qty 1

## 2015-10-25 MED ORDER — KETOROLAC TROMETHAMINE 30 MG/ML IJ SOLN: 30.0000 mg | Freq: Once | INTRAMUSCULAR | Status: DC | PRN

## 2015-10-25 MED ORDER — OXYCODONE HCL 5 MG PO TABS: 5.0000 mg | ORAL_TABLET | ORAL | Status: DC | PRN

## 2015-10-25 MED ORDER — ONDANSETRON HCL 4 MG/2ML IJ SOLN
INTRAMUSCULAR | Status: AC
  Filled 2015-10-25: qty 2

## 2015-10-25 MED ORDER — SODIUM CHLORIDE 0.9% FLUSH
3.0000 mL | Freq: Two times a day (BID) | INTRAVENOUS | Status: DC
Start: 2015-10-25 — End: 2015-10-25

## 2015-10-25 MED ORDER — MIDAZOLAM HCL 2 MG/2ML IJ SOLN
INTRAMUSCULAR | Status: AC
  Filled 2015-10-25: qty 2

## 2015-10-25 MED ORDER — HYDROMORPHONE HCL 1 MG/ML IJ SOLN: 0.2500 mg | INTRAMUSCULAR | Status: DC | PRN

## 2015-10-25 MED ORDER — PROPOFOL 10 MG/ML IV BOLUS
INTRAVENOUS | Status: DC | PRN
  Administered 2015-10-25: 150 mg via INTRAVENOUS

## 2015-10-25 MED ORDER — PROMETHAZINE HCL 25 MG/ML IJ SOLN: 6.2500 mg | INTRAMUSCULAR | Status: DC | PRN

## 2015-10-25 MED ORDER — KETOROLAC TROMETHAMINE 30 MG/ML IJ SOLN
INTRAMUSCULAR | Status: DC | PRN
  Administered 2015-10-25: 30 mg via INTRAVENOUS

## 2015-10-25 MED ORDER — ONDANSETRON HCL 4 MG/2ML IJ SOLN
INTRAMUSCULAR | Status: DC | PRN
  Administered 2015-10-25: 4 mg via INTRAVENOUS

## 2015-10-25 MED ORDER — OXYCODONE HCL 5 MG PO TABS
5.0000 mg | ORAL_TABLET | ORAL | Status: DC | PRN
  Administered 2015-10-25 (×2): 5 mg via ORAL
  Filled 2015-10-25 (×2): qty 1

## 2015-10-25 MED ORDER — ROCURONIUM BROMIDE 100 MG/10ML IV SOLN
INTRAVENOUS | Status: AC
  Filled 2015-10-25: qty 1

## 2015-10-25 MED ORDER — DEXAMETHASONE SODIUM PHOSPHATE 10 MG/ML IJ SOLN
INTRAMUSCULAR | Status: AC
  Filled 2015-10-25: qty 1

## 2015-10-25 MED ORDER — SODIUM CHLORIDE 0.9 % IV SOLN: 250.0000 mL | INTRAVENOUS | Status: DC | PRN

## 2015-10-25 MED ORDER — CHLORHEXIDINE GLUCONATE 4 % EX LIQD: 1.0000 "application " | Freq: Once | CUTANEOUS | Status: DC

## 2015-10-25 MED ORDER — LIDOCAINE HCL (CARDIAC) 20 MG/ML IV SOLN
INTRAVENOUS | Status: AC
  Filled 2015-10-25: qty 5

## 2015-10-25 MED ORDER — SODIUM CHLORIDE 0.9 % IV SOLN: INTRAVENOUS | Status: DC

## 2015-10-25 MED ORDER — DEXAMETHASONE SODIUM PHOSPHATE 10 MG/ML IJ SOLN
INTRAMUSCULAR | Status: DC | PRN
  Administered 2015-10-25: 10 mg via INTRAVENOUS

## 2015-10-25 MED ORDER — 0.9 % SODIUM CHLORIDE (POUR BTL) OPTIME
TOPICAL | Status: DC | PRN
  Administered 2015-10-25: 1000 mL

## 2015-10-25 MED ORDER — FENTANYL CITRATE (PF) 250 MCG/5ML IJ SOLN
INTRAMUSCULAR | Status: AC
  Filled 2015-10-25: qty 5

## 2015-10-25 MED ORDER — CEFOTETAN DISODIUM-DEXTROSE 2-2.08 GM-% IV SOLR
INTRAVENOUS | Status: AC
  Filled 2015-10-25: qty 50

## 2015-10-25 MED ORDER — MIDAZOLAM HCL 5 MG/5ML IJ SOLN
INTRAMUSCULAR | Status: DC | PRN
  Administered 2015-10-25: 2 mg via INTRAVENOUS

## 2015-10-25 MED ORDER — SCOPOLAMINE 1 MG/3DAYS TD PT72
MEDICATED_PATCH | TRANSDERMAL | Status: DC | PRN
  Administered 2015-10-25: 1 via TRANSDERMAL

## 2015-10-25 MED ORDER — CEFOTETAN DISODIUM-DEXTROSE 2-2.08 GM-% IV SOLR
2.0000 g | INTRAVENOUS | Status: AC
  Administered 2015-10-25: 2 g via INTRAVENOUS

## 2015-10-25 MED ORDER — SODIUM CHLORIDE 0.9 % IJ SOLN
INTRAMUSCULAR | Status: AC
  Filled 2015-10-25: qty 10

## 2015-10-25 MED ORDER — BUPIVACAINE-EPINEPHRINE 0.25% -1:200000 IJ SOLN
INTRAMUSCULAR | Status: DC | PRN
  Administered 2015-10-25: 50 mL

## 2015-10-25 MED ORDER — LIDOCAINE HCL (CARDIAC) 20 MG/ML IV SOLN
INTRAVENOUS | Status: DC | PRN
  Administered 2015-10-25: 50 mg via INTRAVENOUS

## 2015-10-25 MED ORDER — EPHEDRINE SULFATE 50 MG/ML IJ SOLN
INTRAMUSCULAR | Status: AC
  Filled 2015-10-25: qty 1

## 2015-10-25 MED ORDER — HYDROMORPHONE HCL 1 MG/ML IJ SOLN
INTRAMUSCULAR | Status: DC | PRN
  Administered 2015-10-25 (×2): 1 mg via INTRAVENOUS

## 2015-10-25 MED ORDER — BUPIVACAINE-EPINEPHRINE 0.25% -1:200000 IJ SOLN
INTRAMUSCULAR | Status: AC
  Filled 2015-10-25: qty 1

## 2015-10-25 MED ORDER — SODIUM CHLORIDE 0.9% FLUSH: 3.0000 mL | INTRAVENOUS | Status: DC | PRN

## 2015-10-25 MED ORDER — SCOPOLAMINE 1 MG/3DAYS TD PT72
MEDICATED_PATCH | TRANSDERMAL | Status: AC
  Filled 2015-10-25: qty 1

## 2015-10-25 MED ORDER — FENTANYL CITRATE (PF) 100 MCG/2ML IJ SOLN
INTRAMUSCULAR | Status: DC | PRN
  Administered 2015-10-25 (×3): 50 ug via INTRAVENOUS
  Administered 2015-10-25: 100 ug via INTRAVENOUS

## 2015-10-25 MED ORDER — ROCURONIUM BROMIDE 100 MG/10ML IV SOLN
INTRAVENOUS | Status: DC | PRN
  Administered 2015-10-25: 40 mg via INTRAVENOUS
  Administered 2015-10-25: 10 mg via INTRAVENOUS

## 2015-10-25 MED ORDER — NEOSTIGMINE METHYLSULFATE 10 MG/10ML IV SOLN
INTRAVENOUS | Status: AC
  Filled 2015-10-25: qty 1

## 2015-10-25 MED ORDER — LACTATED RINGERS IV SOLN
INTRAVENOUS | Status: DC | PRN
  Administered 2015-10-25: 07:00:00 via INTRAVENOUS

## 2015-10-25 MED ORDER — EPHEDRINE SULFATE 50 MG/ML IJ SOLN
INTRAMUSCULAR | Status: DC | PRN
  Administered 2015-10-25 (×2): 5 mg via INTRAVENOUS

## 2015-10-25 MED ORDER — GLYCOPYRROLATE 0.2 MG/ML IJ SOLN
INTRAMUSCULAR | Status: DC | PRN
  Administered 2015-10-25: .6 mg via INTRAVENOUS

## 2015-10-25 MED ORDER — NEOSTIGMINE METHYLSULFATE 10 MG/10ML IV SOLN
INTRAVENOUS | Status: DC | PRN
  Administered 2015-10-25: 4 mg via INTRAVENOUS

## 2015-10-25 MED ORDER — HYDROMORPHONE HCL 2 MG/ML IJ SOLN
INTRAMUSCULAR | Status: AC
  Filled 2015-10-25: qty 1

## 2015-10-25 MED ORDER — LACTATED RINGERS IR SOLN
Status: DC | PRN
  Administered 2015-10-25: 1000 mL

## 2015-10-25 SURGICAL SUPPLY — 50 items
APPLICATOR ARISTA FLEXITIP XL (MISCELLANEOUS) IMPLANT
APPLIER CLIP 5 13 M/L LIGAMAX5 (MISCELLANEOUS) ×2
APPLIER CLIP ROT 10 11.4 M/L (STAPLE)
BENZOIN TINCTURE PRP APPL 2/3 (GAUZE/BANDAGES/DRESSINGS) ×2 IMPLANT
CABLE HIGH FREQUENCY MONO STRZ (ELECTRODE) ×2 IMPLANT
CHLORAPREP W/TINT 26ML (MISCELLANEOUS) ×2 IMPLANT
CLIP APPLIE 5 13 M/L LIGAMAX5 (MISCELLANEOUS) ×1 IMPLANT
CLIP APPLIE ROT 10 11.4 M/L (STAPLE) IMPLANT
COVER MAYO STAND STRL (DRAPES) ×2 IMPLANT
COVER SURGICAL LIGHT HANDLE (MISCELLANEOUS) IMPLANT
DECANTER SPIKE VIAL GLASS SM (MISCELLANEOUS) ×2 IMPLANT
DERMABOND ADVANCED (GAUZE/BANDAGES/DRESSINGS)
DERMABOND ADVANCED .7 DNX12 (GAUZE/BANDAGES/DRESSINGS) IMPLANT
DEVICE TROCAR PUNCTURE CLOSURE (ENDOMECHANICALS) ×2 IMPLANT
DRAPE C-ARM 42X120 X-RAY (DRAPES) ×2 IMPLANT
DRAPE LAPAROSCOPIC ABDOMINAL (DRAPES) ×2 IMPLANT
DRSG TEGADERM 2-3/8X2-3/4 SM (GAUZE/BANDAGES/DRESSINGS) ×2 IMPLANT
DRSG TEGADERM 4X4.75 (GAUZE/BANDAGES/DRESSINGS) ×2 IMPLANT
ELECT PENCIL ROCKER SW 15FT (MISCELLANEOUS) IMPLANT
ELECT REM PT RETURN 9FT ADLT (ELECTROSURGICAL) ×2
ELECTRODE REM PT RTRN 9FT ADLT (ELECTROSURGICAL) ×1 IMPLANT
GAUZE SPONGE 2X2 8PLY STRL LF (GAUZE/BANDAGES/DRESSINGS) ×1 IMPLANT
GLOVE BIO SURGEON STRL SZ7.5 (GLOVE) ×2 IMPLANT
GLOVE INDICATOR 8.0 STRL GRN (GLOVE) ×2 IMPLANT
GOWN STRL REUS W/TWL XL LVL3 (GOWN DISPOSABLE) ×6 IMPLANT
HEMOSTAT ARISTA ABSORB 3G PWDR (MISCELLANEOUS) IMPLANT
HEMOSTAT SNOW SURGICEL 2X4 (HEMOSTASIS) IMPLANT
KIT BASIN OR (CUSTOM PROCEDURE TRAY) ×2 IMPLANT
L-HOOK LAP DISP 36CM (ELECTROSURGICAL)
LHOOK LAP DISP 36CM (ELECTROSURGICAL) IMPLANT
PAD POSITIONING PINK XL (MISCELLANEOUS) ×2 IMPLANT
POSITIONER SURGICAL ARM (MISCELLANEOUS) ×2 IMPLANT
POUCH RETRIEVAL ECOSAC 10 (ENDOMECHANICALS) ×1 IMPLANT
POUCH RETRIEVAL ECOSAC 10MM (ENDOMECHANICALS) ×1
SCISSORS LAP 5X35 DISP (ENDOMECHANICALS) ×2 IMPLANT
SET CHOLANGIOGRAPH MIX (MISCELLANEOUS) IMPLANT
SET IRRIG TUBING LAPAROSCOPIC (IRRIGATION / IRRIGATOR) ×2 IMPLANT
SLEEVE XCEL OPT CAN 5 100 (ENDOMECHANICALS) ×4 IMPLANT
SPONGE GAUZE 2X2 STER 10/PKG (GAUZE/BANDAGES/DRESSINGS) ×1
STRIP CLOSURE SKIN 1/2X4 (GAUZE/BANDAGES/DRESSINGS) ×2 IMPLANT
SUT MNCRL AB 4-0 PS2 18 (SUTURE) ×2 IMPLANT
SUT VICRYL 0 UR6 27IN ABS (SUTURE) ×2 IMPLANT
TAPE CLOTH 4X10 WHT NS (GAUZE/BANDAGES/DRESSINGS) IMPLANT
TOWEL OR 17X26 10 PK STRL BLUE (TOWEL DISPOSABLE) ×2 IMPLANT
TOWEL OR NON WOVEN STRL DISP B (DISPOSABLE) ×2 IMPLANT
TRAY LAPAROSCOPIC (CUSTOM PROCEDURE TRAY) ×2 IMPLANT
TROCAR BLADELESS OPT 5 100 (ENDOMECHANICALS) ×2 IMPLANT
TROCAR XCEL BLUNT TIP 100MML (ENDOMECHANICALS) ×2 IMPLANT
TROCAR XCEL NON-BLD 11X100MML (ENDOMECHANICALS) IMPLANT
TUBING INSUF HEATED (TUBING) ×2 IMPLANT

## 2015-10-25 NOTE — Op Note (Signed)
Gina Moreno [id number] 29-Jul-1986 10/25/2015  Laparoscopic Cholecystectomy Procedure Note  Indications: This patient presents with symptomatic gallbladder disease and will undergo laparoscopic cholecystectomy. Please see h&p for additional details  Pre-operative Diagnosis: symptomatic cholelithiasis  Post-operative Diagnosis: Same  Surgeon: [name]   Assistants: none  Anesthesia: General endotracheal anesthesia  Procedure Details  The patient was seen again in the Holding Room. The risks, benefits, complications, treatment options, and expected outcomes were discussed with the patient. The possibilities of reaction to medication, pulmonary aspiration, perforation of viscus, bleeding, recurrent infection, finding a normal gallbladder, the need for additional procedures, failure to diagnose a condition, the possible need to convert to an open procedure, and creating a complication requiring transfusion or operation were discussed with the patient. The likelihood of improving the patient's symptoms with return to their baseline status is good.  The patient and/or family concurred with the proposed plan, giving informed consent. The site of surgery properly noted. The patient was taken to Operating Room, identified as Gina Moreno and the procedure verified as Laparoscopic Cholecystectomy with possible Intraoperative Cholangiogram. A Time Out was held and the above information confirmed. Antibiotic prophylaxis was administered.   Prior to the induction of general anesthesia, antibiotic prophylaxis was administered. General endotracheal anesthesia was then administered and tolerated well. After the induction, the abdomen was prepped with Chloraprep and draped in the sterile fashion. The patient was positioned in the supine position.  Local anesthetic agent was injected into the skin near the umbilicus and an incision made. We dissected down to the abdominal fascia with blunt dissection.   The fascia was incised vertically and we entered the peritoneal cavity bluntly.  A pursestring suture of 0-Vicryl was placed around the fascial opening.  The Hasson cannula was inserted and secured with the stay suture.  Pneumoperitoneum was then created with CO2 and tolerated well without any adverse changes in the patient's vital signs. An 5-mm port was placed in the subxiphoid position.  Two 5-mm ports were placed in the right upper quadrant. All skin incisions were infiltrated with a local anesthetic agent before making the incision and placing the trocars.   We positioned the patient in reverse Trendelenburg, tilted slightly to the patient's left.  The gallbladder was identified, the fundus grasped and retracted cephalad. Adhesions were lysed bluntly and with the electrocautery where indicated, taking care not to injure any adjacent organs or viscus. The infundibulum was grasped and retracted laterally, exposing the peritoneum overlying the triangle of Calot. This was then divided and exposed in a blunt fashion. A critical view of the cystic duct and cystic artery was obtained.  The cystic duct was clearly identified and bluntly dissected circumferentially. The cystic duct was ligated with a clip distally.     The cystic duct was then ligated with clips and divided. The cystic artery which had been identified & dissected free was ligated with clips and divided as well.   The gallbladder was dissected from the liver bed in retrograde fashion with the electrocautery. There was some spillage of bile from the gallbladder but no stones.  The gallbladder was removed and placed in an Ecco sac.  The gallbladder and Ecco sac were then removed through the umbilical port site. The liver bed was irrigated and inspected. Hemostasis was achieved with the electrocautery. Copious irrigation was utilized and was repeatedly aspirated until clear.  The pursestring suture was used to close the umbilical fascia.  Even though  there was no air leak it felt  a little weak so I placed an additional interrupted 0 vicryl suture using the endoclose device.  We again inspected the right upper quadrant for hemostasis.  The umbilical closure was inspected and there was no air leak and nothing trapped within the closure. Pneumoperitoneum was released as we removed the trocars.  4-0 Monocryl was used to close the skin.   Benzoin, steri-strips, and clean dressings were applied. The patient was then extubated and brought to the recovery room in stable condition. Instrument, sponge, and needle counts were correct at closure and at the conclusion of the case.   Findings:  Cholelithiasis; +critical view  Estimated Blood Loss: Minimal         Drains: none         Specimens: Gallbladder           Complications: None; patient tolerated the procedure well.         Disposition: PACU - hemodynamically stable.         Condition: stable  Gina Moreno. Redmond Pulling, MD, FACS General, Bariatric, & Minimally Invasive Surgery [facility], [place]

## 2015-10-25 NOTE — Interval H&P Note (Signed)
History and Physical Interval Note:  10/25/2015 7:18 AM  Gina Moreno  has presented today for surgery, with the diagnosis of symptomatic cholelithiasis  The various methods of treatment have been discussed with the patient and family. After consideration of risks, benefits and other options for treatment, the patient has consented to  Procedure(s): LAPAROSCOPIC CHOLECYSTECTOMY (N/A) as a surgical intervention .  The patient's history has been reviewed, patient examined, no change in status, stable for surgery.  I have reviewed the patient's chart and labs.  Questions were answered to the patient's satisfaction.    Leighton Ruff. Redmond Pulling, MD, [place], Bariatric, & Minimally Invasive Surgery [facility], [place]    [facility]

## 2015-10-25 NOTE — Transfer of Care (Signed)
Immediate Anesthesia Transfer of Care Note  Patient: Gina Moreno  Procedure(s) Performed: Procedure(s): LAPAROSCOPIC CHOLECYSTECTOMY (N/A)  Patient Location: PACU  Anesthesia Type:General  Level of Consciousness:  sedated, patient cooperative and responds to stimulation  Airway & Oxygen Therapy:Patient Spontanous Breathing and Patient connected to face mask oxgen  Post-op Assessment:  Report given to PACU RN and Post -op Vital signs reviewed and stable  Post vital signs:  Reviewed and stable  Last Vitals:  Filed Vitals:   10/25/15 0543 10/25/15 0849  BP: 150/90 159/70  Pulse: 109 117  Temp: 36.6 C 36.9 C  Resp: 16 16    Complications: No apparent anesthesia complications

## 2015-10-25 NOTE — Discharge Instructions (Signed)
General Anesthesia, Adult, Care After Refer to this sheet in the next few weeks. These instructions provide you with information on caring for yourself after your procedure. Your health care provider may also give you more specific instructions. Your treatment has been planned according to current medical practices, but problems sometimes occur. Call your health care provider if you have any problems or questions after your procedure. WHAT TO EXPECT AFTER THE PROCEDURE After the procedure, it is typical to experience:  Sleepiness.  Nausea and vomiting. HOME CARE INSTRUCTIONS  For the first 24 hours after general anesthesia:  Have a responsible person with you.  Do not drive a car. If you are alone, do not take public transportation.  Do not drink alcohol.  Do not take medicine that has not been prescribed by your health care provider.  Do not sign important papers or make important decisions.  You may resume a normal diet and activities as directed by your health care provider.  Change bandages (dressings) as directed.  If you have questions or problems that seem related to general anesthesia, call the hospital and ask for the anesthetist or anesthesiologist on call. SEEK MEDICAL CARE IF:  You have nausea and vomiting that continue the day after anesthesia.  You develop a rash. SEEK IMMEDIATE MEDICAL CARE IF:   You have difficulty breathing.  You have chest pain.  You have any allergic problems.   This information is not intended to replace advice given to you by your health care provider. Make sure you discuss any questions you have with your health care provider.   Document Released: 11/12/2000 Document Revised: 08/27/2014 Document Reviewed: 12/05/2011 Elsevier Interactive Patient Education 2016 [employer] LAPAROSCOPIC SURGERY: POST OP INSTRUCTIONS Always review your discharge instruction sheet given to you by the facility  where your surgery was performed. IF YOU HAVE DISABILITY OR FAMILY LEAVE FORMS, YOU MUST BRING THEM TO THE OFFICE FOR PROCESSING.   DO NOT GIVE THEM TO YOUR DOCTOR.  1. A prescription for pain medication may be given to you upon discharge.  Take your pain medication as prescribed, if needed.  If narcotic pain medicine is not needed, then you may take acetaminophen (Tylenol) or ibuprofen (Advil) as needed. 2. Take your usually prescribed medications unless otherwise directed. 3. If you need a refill on your pain medication, please contact your pharmacy.  They will contact our office to request authorization. Prescriptions will not be filled after 5pm or on week-ends. 4. You should follow a light diet the first few days after arrival home, such as soup and crackers, etc.  Be sure to include lots of fluids daily. 5. Most patients will experience some swelling and bruising in the area of the incisions.  Ice packs will help.  Swelling and bruising can take several days to resolve.  6. It is common to experience some constipation if taking pain medication after surgery.  Increasing fluid intake and taking a stool softener (such as Colace) will usually help or prevent this problem from occurring.  A mild laxative (Milk of Magnesia or Miralax) should be taken according to package instructions if there are no bowel movements after 48 hours. 7. Unless discharge instructions indicate otherwise, you may remove your bandages 48 hours after surgery, and you may shower at that time.  You  have steri-strips (small skin tapes) in place directly over the incision.  These strips should be left on the skin for 7-10 days. Marland Kitchen  8. ACTIVITIES:  You may resume regular (light) daily activities beginning the next day--such as daily self-care, walking, climbing stairs--gradually increasing activities as tolerated.  You may have sexual intercourse when it is comfortable.  Refrain from any heavy lifting or straining until approved by  your doctor. a. You may drive when you are no longer taking prescription pain medication, you can comfortably wear a seatbelt, and you can safely maneuver your car and apply brakes. 9. You should see your doctor in the office for a follow-up appointment approximately 2-3 weeks after your surgery.  Make sure that you call for this appointment within a day or two after you arrive home to insure a convenient appointment time. 10. OTHER INSTRUCTIONS:  WHEN TO CALL YOUR DOCTOR: 1. Fever over 101.0 2. Inability to urinate 3. Continued bleeding from incision. 4. Increased pain, redness, or drainage from the incision. 5. Increasing abdominal pain  The clinic staff is available to answer your questions during regular business hours.  Please dont hesitate to call and ask to speak to one of the nurses for clinical concerns.  If you have a medical emergency, go to the nearest emergency room or call 911.  A surgeon from [facility] is always on call at the hospital. [address], [place], [place], [place]  [address] ? [address], [place], [place]   [address] [phone] ? [phone] ? FAX [phone] Web site: [email or web address]

## 2015-10-25 NOTE — H&P (View-Only) (Signed)
Gina Moreno 10/03/2015 9:24 AM Location: [facility] Patient #: [id number] DOB: May 26, 1986 Married / Language: English / Race: White Female   History of Present Illness Gina Moreno M. Kimmerly [name] MD; 10/03/2015 9:59 AM) The patient is a 30 year old female who presents for evaluation of gall stones. She is referred by [name], PA-C for evaluation of gallstones and RUQ pain. She states a several month history of epigastric and right upper quadrant pain that radiates to her shoulder. This started happening intermittently around new years. The pain will last several hours. She will often have nausea with it. She denies any diarrhea or constipation, weight loss, acholic stools. She does take medication for reflux. The reflux medication will help but it will not take care of all of the discomfort. She has noticed a correlation with greasy fatty foods   Problem List/Past Medical Gina Moreno Ronnie Derby, MD; 10/03/2015 9:59 AM) SYMPTOMATIC CHOLELITHIASIS (K80.20)  Other Problems Gina Curry, MD; 10/03/2015 9:59 AM) Anxiety Disorder Depression Gastroesophageal Reflux Disease Heart murmur Migraine Headache  Past Surgical History Gina Moreno, CMA; 10/03/2015 9:31 AM) Oral Surgery  Diagnostic Studies History Gina Moreno, CMA; 10/03/2015 9:31 AM) Colonoscopy never Mammogram never Pap Smear 1-5 years ago  Allergies Gina Moreno, CMA; 10/03/2015 9:32 AM) Imitrex *MIGRAINE PRODUCTS* Phenergan *ANTIHISTAMINES* Doxycycline Hyclate *Tetracyclines**  Medication History Gina Moreno, CMA; 10/03/2015 9:32 AM) Dexilant (60MG  Capsule DR, Oral) Active. FLUoxetine HCl (40MG  Capsule, Oral) Active. Microgestin FE 1/20 (1-20MG -MCG Tablet, Oral) Active. Medications Reconciled  Social History Gina Moreno, [place]; 10/03/2015 9:31 AM) Alcohol use Occasional alcohol use. No caffeine use No drug use Tobacco use Never smoker.  Family History Gina Moreno, [place]; 10/03/2015 9:31  AM) Arthritis Family Members In General. Cervical Cancer Family Members In General. Colon Cancer Family Members In General. Depression Family Members In General. Diabetes Mellitus Family Members In General. Ovarian Cancer Family Members In General. Thyroid problems Mother.  Pregnancy / Birth History Gina Moreno, CMA; 10/03/2015 9:31 AM) Age at menarche 75 years. Contraceptive History Oral contraceptives. Gravida 1 Maternal age 64-25 Para 1 Regular periods  History Addendum Note([name] M. [name] MD; 10/03/2015 10:01 AM) She had an abdominal ultrasound that showed gallstones without wall thickening. Her common bile duct was normal. She had blood work done on February 8 which showed a normal CBC and a normal comprehensive metabolic panel. Her amylase and lipase were normal.    Review of Systems Gina Moreno CMA; 10/03/2015 9:31 AM) General Present- Chills. Not Present- Appetite Loss, Fatigue, Fever, Night Sweats, Weight Gain and Weight Loss. Skin Not Present- Change in Wart/Mole, Dryness, Hives, Jaundice, New Lesions, Non-Healing Wounds, Rash and Ulcer. HEENT Not Present- Earache, Hearing Loss, Hoarseness, Nose Bleed, Oral Ulcers, Ringing in the Ears, Seasonal Allergies, Sinus Pain, Sore Throat, Visual Disturbances, Wears glasses/contact lenses and Yellow Eyes. Respiratory Not Present- Bloody sputum, Chronic Cough, Difficulty Breathing, Snoring and Wheezing. Breast Not Present- Breast Mass, Breast Pain, Nipple Discharge and Skin Changes. Cardiovascular Not Present- Chest Pain, Difficulty Breathing Lying Down, Leg Cramps, Palpitations, Rapid Heart Rate, Shortness of Breath and Swelling of Extremities. Gastrointestinal Present- Indigestion and Nausea. Not Present- Abdominal Pain, Bloating, Bloody Stool, Change in Bowel Habits, Chronic diarrhea, Constipation, Difficulty Swallowing, Excessive gas, Gets full quickly at meals, Hemorrhoids, Rectal Pain and Vomiting. Female  Genitourinary Not Present- Frequency, Nocturia, Painful Urination, Pelvic Pain and Urgency. Musculoskeletal Not Present- Back Pain, Joint Pain, Joint Stiffness, Muscle Pain, Muscle Weakness and Swelling of Extremities. Neurological Present- Headaches. Not Present- Decreased Memory, Fainting, Numbness, Seizures,  Tingling, Tremor, Trouble walking and Weakness. Psychiatric Present- Anxiety. Not Present- Bipolar, Change in Sleep Pattern, Depression, Fearful and Frequent crying. Endocrine Not Present- Cold Intolerance, Excessive Hunger, Hair Changes, Heat Intolerance, Hot flashes and New Diabetes. Hematology Not Present- Easy Bruising, Excessive bleeding, Gland problems, HIV and Persistent Infections.  Vitals Gina Moreno CMA; 10/03/2015 9:32 AM) 10/03/2015 9:32 AM Weight: 174 lb Height: 67in Body Surface Area: 1.91 m Body Mass Index: 27.25 kg/m  Temp.: 98.59F(Temporal)  Pulse: 119 (Regular)  BP: 126/78 (Sitting, Left Arm, Standard)       Physical Exam Gina Moreno M. Adja Ruff MD; 10/03/2015 9:56 AM) General Mental Status-Alert. General Appearance-Consistent with stated age. Hydration-Well hydrated. Voice-Normal.  Head and Neck Head-normocephalic, atraumatic with no lesions or palpable masses. Trachea-midline. Thyroid Gland Characteristics - normal size and consistency.  Eye Eyeball - Bilateral-Extraocular movements intact. Sclera/Conjunctiva - Bilateral-No scleral icterus.  Chest and Lung Exam Chest and lung exam reveals -quiet, even and easy respiratory effort with no use of accessory muscles and on auscultation, normal breath sounds, no adventitious sounds and normal vocal resonance. Inspection Chest Wall - Normal. Back - normal.  Breast - Did not examine.  Cardiovascular Cardiovascular examination reveals -normal heart sounds, regular rate and rhythm with no murmurs and normal pedal pulses bilaterally.  Abdomen Inspection Inspection of the abdomen  reveals - No Hernias. Skin - Scar - no surgical scars. Palpation/Percussion Palpation and Percussion of the abdomen reveal - Soft, Non Tender, No Rebound tenderness, No Rigidity (guarding) and No hepatosplenomegaly. Auscultation Auscultation of the abdomen reveals - Bowel sounds normal.  Peripheral Vascular Upper Extremity Palpation - Pulses bilaterally normal.  Neurologic Neurologic evaluation reveals -alert and oriented x 3 with no impairment of recent or remote memory. Mental Status-Normal.  Neuropsychiatric The patient's mood and affect are described as -normal. Judgment and Insight-insight is appropriate concerning matters relevant to self.  Musculoskeletal Normal Exam - Left-Upper Extremity Strength Normal and Lower Extremity Strength Normal. Normal Exam - Right-Upper Extremity Strength Normal and Lower Extremity Strength Normal.  Lymphatic Head & Neck  General Head & Neck Lymphatics: Bilateral - Description - Normal. Axillary - Did not examine. Femoral & Inguinal - Did not examine.    Assessment & Plan Gina Moreno M. Joshawa Dubin MD; 10/03/2015 9:56 AM) SYMPTOMATIC CHOLELITHIASIS (K80.20) Impression: I believe the patient's symptoms are consistent with gallbladder disease.  We discussed gallbladder disease. The patient was given Neurosurgeon. We discussed non-operative and operative management. We discussed the signs & symptoms of acute cholecystitis  I discussed laparoscopic cholecystectomy with IOC in detail. The patient was given educational material as well as diagrams detailing the procedure. We discussed the risks and benefits of a laparoscopic cholecystectomy including, but not limited to bleeding, infection, injury to surrounding structures such as the intestine or liver, bile leak, retained gallstones, need to convert to an open procedure, prolonged diarrhea, blood clots such as DVT, common bile duct injury, anesthesia risks, and possible need for  additional procedures. We discussed the typical post-operative recovery course. I explained that the likelihood of improvement of their symptoms is good.  The patient has elected to proceed with surgery. Current Plans Pt Education - Pamphlet Given - Laparoscopic Gallbladder Surgery: discussed with patient and provided information. You are being scheduled for surgery - Our schedulers will call you.  You should hear from our office's scheduling department within 5 working days about the location, date, and time of surgery. We try to make accommodations for patient's preferences in scheduling surgery, but sometimes the OR schedule  or the surgeon's schedule prevents [place] from making those accommodations.  If you have not heard from our office [phone]) in 5 working days, call the office and ask for your surgeon's nurse.  If you have other questions about your diagnosis, plan, or surgery, call the office and ask for your surgeon's nurse.  Pt Education - CCS Laparosopic Post Op HCI ([place])  Leighton Ruff. Redmond Pulling, MD, FACS General, Bariatric, & Minimally Invasive Surgery [facility], [place]

## 2015-10-25 NOTE — Anesthesia Postprocedure Evaluation (Signed)
Anesthesia Post Note  Patient: Data processing manager  Procedure(s) Performed: Procedure(s) (LRB): LAPAROSCOPIC CHOLECYSTECTOMY (N/A)  Patient location during evaluation: PACU Anesthesia Type: General Level of consciousness: awake and alert Pain management: pain level controlled Vital Signs Assessment: post-procedure vital signs reviewed and stable Respiratory status: spontaneous breathing, nonlabored ventilation, respiratory function stable and patient connected to nasal cannula oxygen Cardiovascular status: blood pressure returned to baseline and stable Postop Assessment: no signs of nausea or vomiting Anesthetic complications: no    Last Vitals:  Filed Vitals:   10/25/15 0915 10/25/15 0930  BP: 153/80 147/76  Pulse: 115 110  Temp:    Resp: 14 24    Last Pain:  Filed Vitals:   10/25/15 0931  PainSc: 0-No pain                 Claudio Mondry S

## 2015-10-25 NOTE — Anesthesia Procedure Notes (Signed)
Procedure Name: Intubation Date/Time: 10/25/2015 7:33 AM Performed by: Anne Fu Pre-anesthesia Checklist: Patient identified, Emergency Drugs available, Suction available, Patient being monitored and Timeout performed Patient Re-evaluated:Patient Re-evaluated prior to inductionOxygen Delivery Method: Circle system utilized Preoxygenation: Pre-oxygenation with 100% oxygen Intubation Type: IV induction Ventilation: Mask ventilation without difficulty Laryngoscope Size: Mac and 4 Grade View: Grade I Tube type: Oral Tube size: 7.5 mm Number of attempts: 1 Airway Equipment and Method: Stylet Placement Confirmation: ETT inserted through vocal cords under direct vision,  positive ETCO2,  CO2 detector and breath sounds checked- equal and bilateral Secured at: 19 cm Tube secured with: Tape Dental Injury: Teeth and Oropharynx as per pre-operative assessment

## 2015-11-17 ENCOUNTER — Other Ambulatory Visit: Payer: Self-pay | Admitting: Family Medicine

## 2015-11-17 DIAGNOSIS — F329 Major depressive disorder, single episode, unspecified: Secondary | ICD-10-CM

## 2015-11-17 DIAGNOSIS — K219 Gastro-esophageal reflux disease without esophagitis: Secondary | ICD-10-CM

## 2015-11-17 DIAGNOSIS — F419 Anxiety disorder, unspecified: Secondary | ICD-10-CM

## 2015-11-17 DIAGNOSIS — Z23 Encounter for immunization: Secondary | ICD-10-CM

## 2015-11-17 DIAGNOSIS — Z0000 Encounter for general adult medical examination without abnormal findings: Secondary | ICD-10-CM

## 2015-11-17 MED ORDER — DEXILANT 60 MG PO CPDR: 60.0000 mg | DELAYED_RELEASE_CAPSULE | Freq: Every day | ORAL | Status: DC

## 2015-11-23 ENCOUNTER — Other Ambulatory Visit: Payer: Self-pay

## 2015-11-23 MED ORDER — FLUOXETINE HCL 20 MG PO CAPS: ORAL_CAPSULE | ORAL | Status: DC

## 2015-11-25 ENCOUNTER — Other Ambulatory Visit: Payer: Self-pay | Admitting: Family Medicine

## 2015-11-25 MED ORDER — FLUOXETINE HCL 20 MG PO CAPS: 20.0000 mg | ORAL_CAPSULE | Freq: Every day | ORAL | Status: DC

## 2016-04-05 ENCOUNTER — Ambulatory Visit (INDEPENDENT_AMBULATORY_CARE_PROVIDER_SITE_OTHER): Payer: BLUE CROSS/BLUE SHIELD | Admitting: Family Medicine

## 2016-04-05 ENCOUNTER — Encounter: Payer: Self-pay | Admitting: Family Medicine

## 2016-04-05 VITALS — BP 122/84 | HR 99 | Temp 98.2°F | Ht 67.0 in | Wt 183.8 lb

## 2016-04-05 DIAGNOSIS — R3915 Urgency of urination: Secondary | ICD-10-CM | POA: Diagnosis not present

## 2016-04-05 DIAGNOSIS — R140 Abdominal distension (gaseous): Secondary | ICD-10-CM | POA: Diagnosis not present

## 2016-04-05 LAB — POCT URINALYSIS DIP (MANUAL ENTRY)
Bilirubin, UA: NEGATIVE
GLUCOSE UA: NEGATIVE
Ketones, POC UA: NEGATIVE
Leukocytes, UA: NEGATIVE
NITRITE UA: NEGATIVE
PH UA: 6
Protein Ur, POC: NEGATIVE
Spec Grav, UA: >=1.03
UROBILINOGEN UA: NEGATIVE

## 2016-04-05 LAB — POCT URINE PREGNANCY: Preg Test, Ur: NEGATIVE

## 2016-04-05 NOTE — Progress Notes (Signed)
Pre visit review using our clinic review tool, if applicable. No additional management support is needed unless otherwise documented below in the visit note. 

## 2016-04-05 NOTE — Patient Instructions (Signed)
We will check on your abdominal ultrasound for you, and will also make sure that your urine culture is negative for any infection.  I do think that you will be able to get pregnant in time; you might want to see your OBG in the next couple of months.

## 2016-04-05 NOTE — Progress Notes (Signed)
[facility] at [facility] [address] [place], [place] [address] [id number]  Date:  04/05/2016   Name:  Gina Moreno   DOB:  1986-02-24   MRN:  [id number]  PCP:  Penni Homans, MD    Chief Complaint: Bloated (c/o bloating. Pt had gall bladder surgery 5 months ago)   History of Present Illness:  Gina Moreno is a 30 y.o. very pleasant female patient who presents with the following:  Here today with concern of abd bloating and other sx as described below since March of this year. She did have her gallbladder removed in March.  She notes that since then she "has felt more flat and bloated."  She has noted pelvic pain- saw her GYN, everything looked ok per her report.  She was tested for STI- which she did not suspect- and was negative, but has not had a pelvic US as of yet She has her menses right now, but otherwise her pelvic cramping is better.   She has noted pain with intercourse for the last several months Her bowels have been moving ok- she is not having diarrhea.  She is trying to become pregnant right now.   No vomiting.  Her appetite has been ok She does feel like she has increased urge to urinate.   She came off OCP in March as they desired to become pregnant.  She had been on OCP since her oldest was born in 2014.   Her menses have been regular since coming off OCP She is worried as she became pregnant with her last baby right away, wonders if there is a problem stopping her from conceiving   Wt Readings from Last 3 Encounters:  04/05/16 183 lb 12.8 oz (83.4 kg)  10/25/15 177 lb 4 oz (80.4 kg)  10/19/15 177 lb 4 oz (80.4 kg)   LMP was 8/14  Patient Active Problem List   Diagnosis Date Noted  . Migraine 03/22/2015  . Tachycardia 09/16/2014  . RLS (restless legs syndrome) 05/20/2014  . Anemia 07/25/2013  . Esophageal reflux 07/25/2013  . Anxiety and depression 07/25/2013  . Preventative health care 07/25/2013   . Hyperlipidemia, mixed 07/25/2013    Past Medical History:  Diagnosis Date  . Anemia 07/25/2013  . Anxiety   . Anxiety and depression 07/25/2013  . Depression   . Esophageal reflux 07/25/2013  . GERD (gastroesophageal reflux disease)   . Heart murmur    at birth   . Hx of varicella   . IBS (irritable bowel syndrome)   . Migraine 03/22/2015  . Other and unspecified hyperlipidemia 07/25/2013  . Preventative health care 07/25/2013  . RLS (restless legs syndrome) 05/20/2014  . Tachycardia 09/16/2014    Past Surgical History:  Procedure Laterality Date  . CHOLECYSTECTOMY N/A 10/25/2015   Procedure: LAPAROSCOPIC CHOLECYSTECTOMY;  Surgeon: Greer Pickerel, MD;  Location: [facility];  Service: General;  Laterality: N/A;  . WISDOM TOOTH EXTRACTION  30 yrs old    Social History  Substance Use Topics  . Smoking status: Never Smoker  . Smokeless tobacco: Never Used  . Alcohol use No    Family History  Problem Relation Age of Onset  . Hyperlipidemia Father   . Arthritis Paternal Grandmother   . Cancer Paternal Grandmother     cervical  . Diabetes Paternal Grandmother   . Depression Paternal Grandmother   . Cancer Paternal Grandfather     lung stomach  .  Thyroid disease Mother     Allergies  Allergen Reactions  . Imitrex [Sumatriptan]     Syncope  . Phenergan [Promethazine Hcl]     Uncontrollable arm movement.   . Doxycycline Rash    Medication list has been reviewed and updated.  Current Outpatient Prescriptions on File Prior to Visit  Medication Sig Dispense Refill  . aspirin-acetaminophen-caffeine (EXCEDRIN MIGRAINE) 250-250-65 MG tablet Take 1-2 tablets by mouth every 4 (four) hours as needed for migraine.    Marland Kitchen DEXILANT 60 MG capsule Take 1 capsule (60 mg total) by mouth daily. 90 capsule 0  . FLUoxetine (PROZAC) 20 MG capsule Take 1 capsule (20 mg total) by mouth daily. 90 capsule 0  . GILDESS FE 1/20 1-20 MG-MCG tablet Take 1 tablet by mouth daily.     Marland Kitchen oxyCODONE (OXY  IR/ROXICODONE) 5 MG immediate release tablet Take 1-2 tablets (5-10 mg total) by mouth every 4 (four) hours as needed for moderate pain, severe pain or breakthrough pain. 50 tablet 0  . ranitidine (ZANTAC) 300 MG tablet Take 300 mg by mouth at bedtime as needed for heartburn.     No current facility-administered medications on file prior to visit.     Review of Systems:  As per HPI- otherwise negative. No fever, chills, vaginal discharge, blood in urine, blood in stool, weight loss   Physical Examination: Vitals:   04/05/16 1451  BP: 122/84  Pulse: 99  Temp: 98.2 F (36.8 C)   Vitals:   04/05/16 1451  Weight: 183 lb 12.8 oz (83.4 kg)  Height: 5\' 7"  (1.702 m)   Body mass index is 28.79 kg/m. Ideal Body Weight: Weight in (lb) to have BMI = 25: 159.3  GEN: WDWN, NAD, Non-toxic, A & O x 3, overweight, looks well HEENT: Atraumatic, Normocephalic. Neck supple. No masses, No LAD. Ears and Nose: No external deformity. CV: RRR, No M/G/R. No JVD. No thrill. No extra heart sounds. PULM: CTA B, no wheezes, crackles, rhonchi. No retractions. No resp. distress. No accessory muscle use. ABD: S,  ND, +BS. No rebound. No HSM.  She notes a feeling of pressure with palpation over the bladder- however she states this has been present for about 5 months now EXTR: No c/c/e NEURO Normal gait.  PSYCH: Normally interactive. Conversant. Not depressed or anxious appearing.  Calm demeanor.   Results for orders placed or performed in visit on 04/05/16  POCT urinalysis dipstick  Result Value Ref Range   Color, UA yellow yellow   Clarity, UA clear clear   Glucose, UA negative negative   Bilirubin, UA negative negative   Ketones, POC UA negative negative   Spec Grav, UA >=1.030    Blood, UA trace-intact (A) negative   pH, UA 6.0    Protein Ur, POC negative negative   Urobilinogen, UA negative    Nitrite, UA Negative Negative   Leukocytes, UA Negative Negative  POCT urine pregnancy  Result  Value Ref Range   Preg Test, Ur Negative Negative   (Menstruating currently) Assessment and Plan: Abdominal bloating - Plan: POCT urine pregnancy, US Abdomen Complete  Urinary urgency - Plan: POCT urinalysis dipstick, POCT urine pregnancy, Urine culture  Here today with abdominal discomfort, bloating and pain with intercourse since her gallbladder was removed and she went off OCP at around the same time this past March Reassured that it it often does take couples several months to get pregnant and I expect she will be able to have another baby She had  a normal CMP and CBC 6 months ago Will culture her urine.  Plan an abd [place] However it does seem that many of her complaints are pelvic in nature- encouraged her to follow-up with her OBG as well, she may need a pelvic US Will plan further follow- up pending labs.   Signed Lamar Blinks, MD

## 2016-04-07 ENCOUNTER — Ambulatory Visit (HOSPITAL_BASED_OUTPATIENT_CLINIC_OR_DEPARTMENT_OTHER)
Admission: RE | Admit: 2016-04-07 | Discharge: 2016-04-07 | Disposition: A | Discharge status: Home / Self Care | Payer: BLUE CROSS/BLUE SHIELD | Source: Ambulatory Visit | Attending: Family Medicine | Admitting: Family Medicine

## 2016-04-07 DIAGNOSIS — R140 Abdominal distension (gaseous): Secondary | ICD-10-CM | POA: Insufficient documentation

## 2016-04-07 DIAGNOSIS — Z9049 Acquired absence of other specified parts of digestive tract: Secondary | ICD-10-CM | POA: Diagnosis not present

## 2016-04-09 ENCOUNTER — Encounter: Payer: Self-pay | Admitting: Family Medicine

## 2016-04-28 IMAGING — MR MR HEAD W/O CM
10 series · 48 of 48 positions shown · non-contrast
Comparison: CT head without contrast 01/22/2004

CLINICAL DATA: Migraine headaches over the last year with
increasing severity. Increasing frequency of headaches. Syncope.

EXAM:
MRI HEAD WITHOUT CONTRAST
TECHNIQUE: Multiplanar, multiecho pulse sequences of the brain and surrounding
structures were obtained without intravenous contrast.

[Series 2: T1 · sagittal · 5.0mm · 0.45mm/px · 3 of 21 slices shown]
[im 1/21]
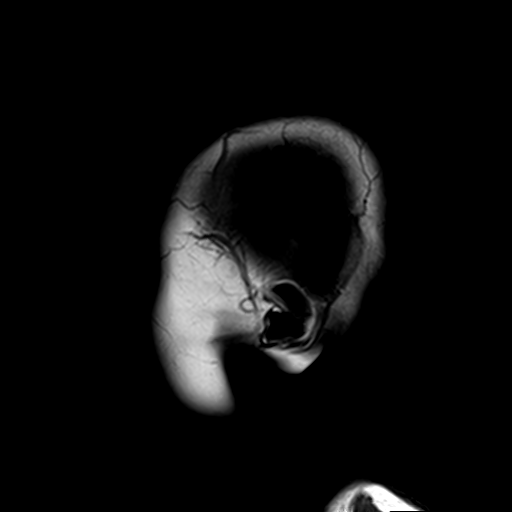
[im 11/21]
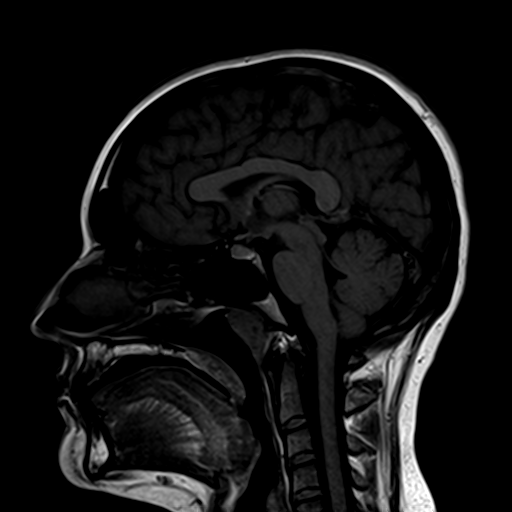
[im 21/21]
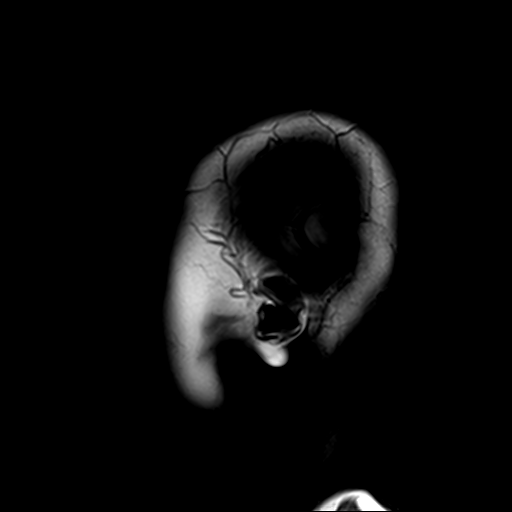

[Series 5: DWI · coronal · 5.0mm · 1.80mm/px · 6 of 59 slices shown (1 of 4)]
[im 1/59]
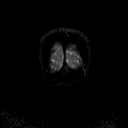
[im 12/59]
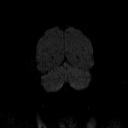
[im 24/59]
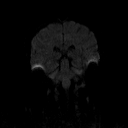
[im 35/59]
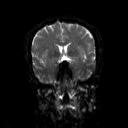
[im 47/59]
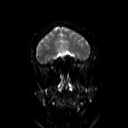
[im 59/59]
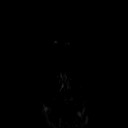

[Series 6: DWI · coronal · 5.0mm · 1.80mm/px · 3 of 30 slices shown (2 of 4)]
[im 1/30]
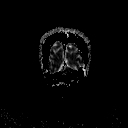
[im 15/30]
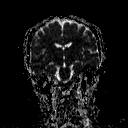
[im 30/30]
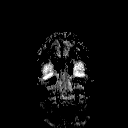

[Series 8: swi_images · axial · 2.0mm · 0.90mm/px · z∈[-57,+100]mm · 8 of 80 slices shown]
[im 1/80]
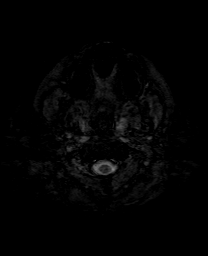
[im 12/80]
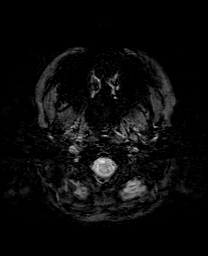
[im 23/80]
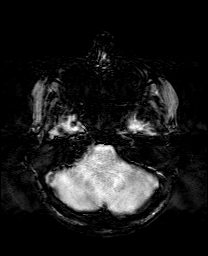
[im 34/80]
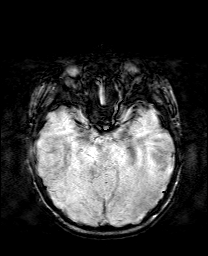
[im 46/80]
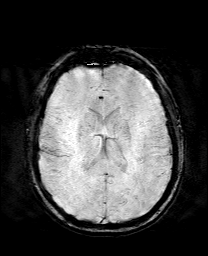
[im 57/80]
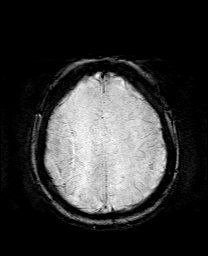
[im 68/80]
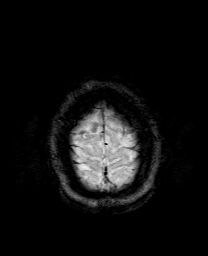
[im 80/80]
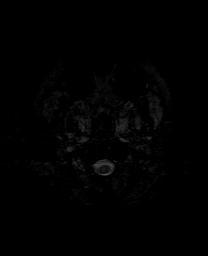

[Series 9: T2 · axial · 5.0mm · 0.51mm/px · z∈[-49,+92]mm · 2 of 22 slices shown (1 of 2)]
[im 1/22]
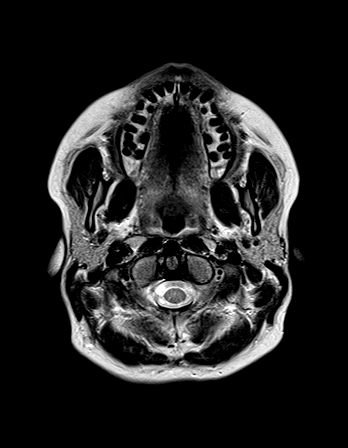
[im 22/22]
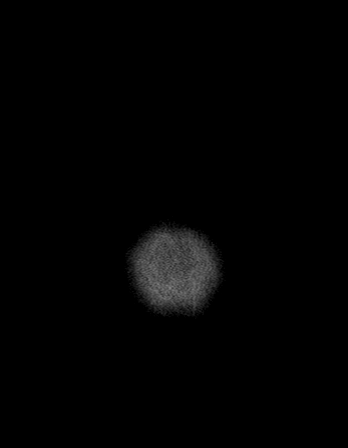

[Series 10: FLAIR · axial · 5.0mm · 0.45mm/px · z∈[-51,+91]mm · 2 of 22 slices shown]
[im 1/22]
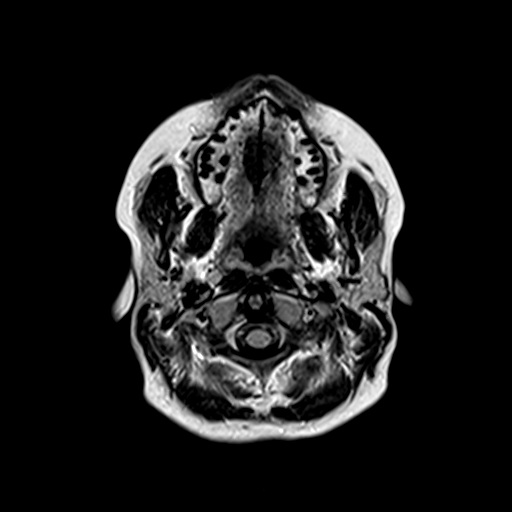
[im 22/22]
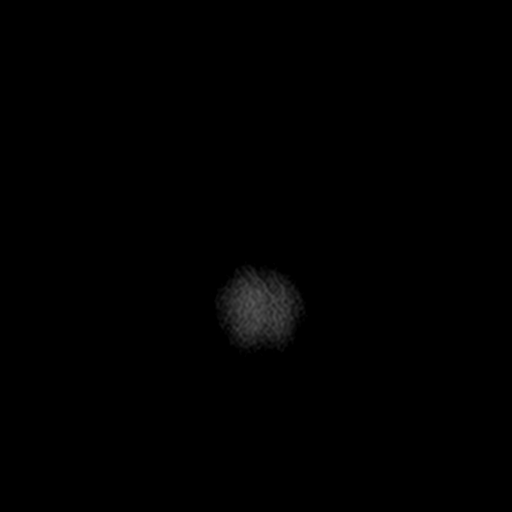

[Series 11: t1_mpr_tra · axial · 2.0mm · 0.45mm/px · z∈[-49,+93]mm · 7 of 72 slices shown]
[im 1/72]
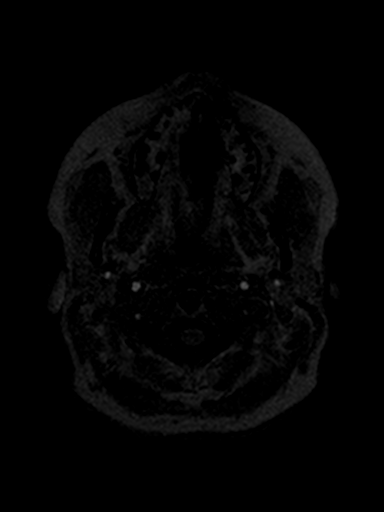
[im 12/72]
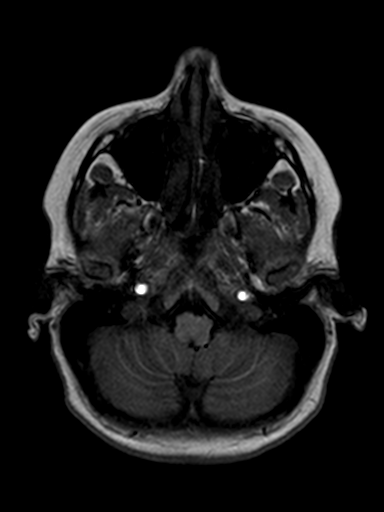
[im 24/72]
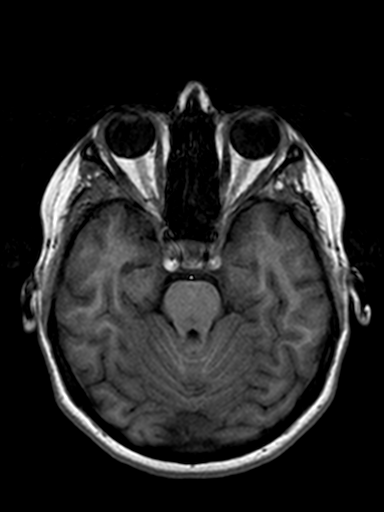
[im 36/72]
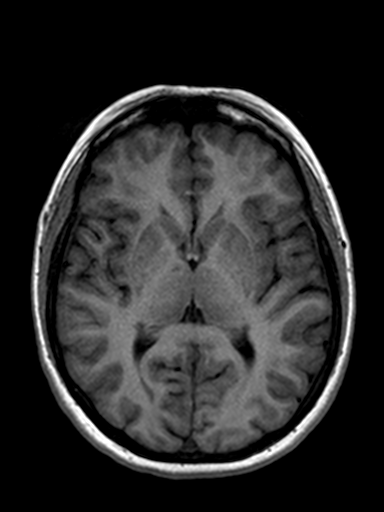
[im 48/72]
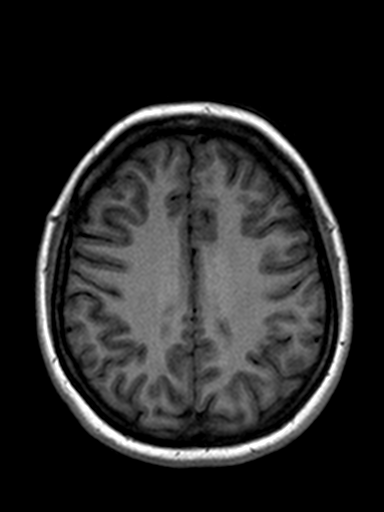
[im 60/72]
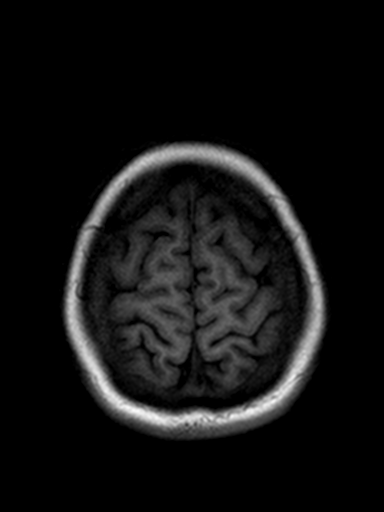
[im 72/72]
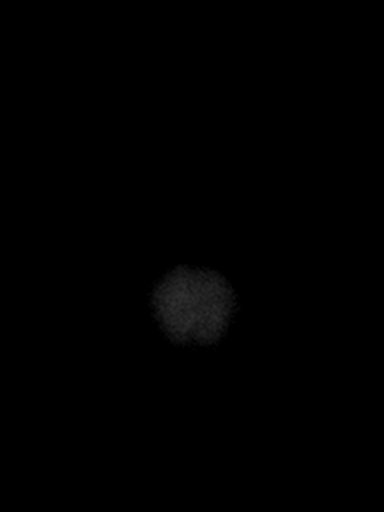

[Series 12: T2 · coronal · 5.0mm · 0.45mm/px · 2 of 25 slices shown (2 of 2)]
[im 1/25]
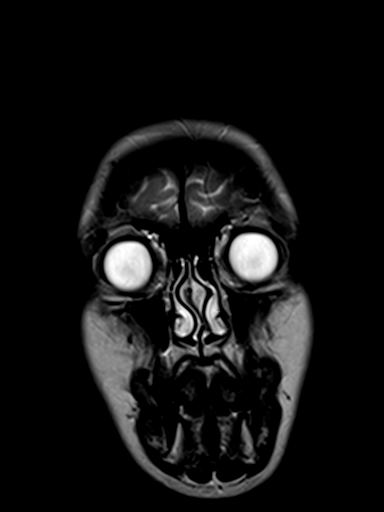
[im 25/25]
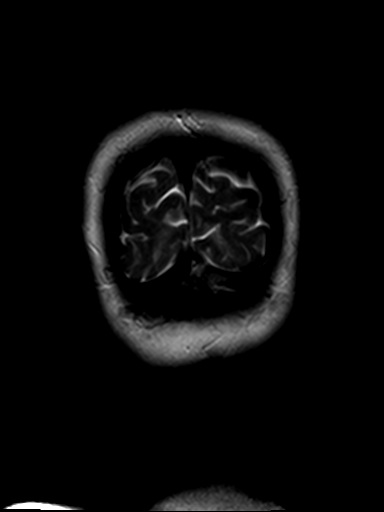

[Series 13: DWI · axial · 3.0mm · 1.80mm/px · z∈[-53,+93]mm · 10 of 100 slices shown (3 of 4)]
[im 1/100]
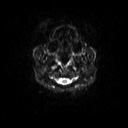
[im 12/100]
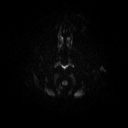
[im 23/100]
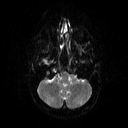
[im 34/100]
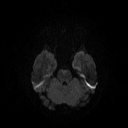
[im 45/100]
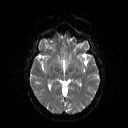
[im 56/100]
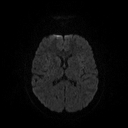
[im 67/100]
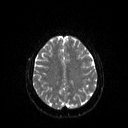
[im 78/100]
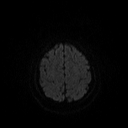
[im 89/100]
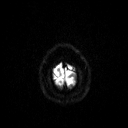
[im 100/100]
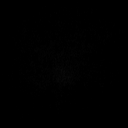

[Series 14: DWI · axial · 3.0mm · 1.80mm/px · z∈[-53,+93]mm · 5 of 50 slices shown (4 of 4)]
[im 1/50]
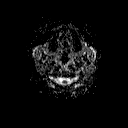
[im 13/50]
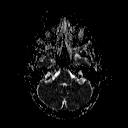
[im 25/50]
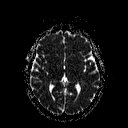
[im 37/50]
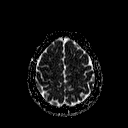
[im 50/50]
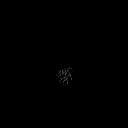

[48 of 48 positions shown; findings below may reference images not displayed]

FINDINGS: No acute infarct, hemorrhage, or mass lesion is present. The
ventricles are of normal size.

No significant white matter disease is present. The basal ganglia
and brainstem are within normal limits. The internal auditory canals
are unremarkable.

Flow is present in the major intracranial arteries. The globes and
orbits are intact. The paranasal sinuses and mastoid air cells are
clear.

Skullbase is within normal limits. Midline structures are
unremarkable.
IMPRESSION: 1. Negative MRI of the brain.

## 2016-05-10 ENCOUNTER — Encounter: Payer: Self-pay | Admitting: Family Medicine

## 2016-05-21 ENCOUNTER — Ambulatory Visit (INDEPENDENT_AMBULATORY_CARE_PROVIDER_SITE_OTHER): Payer: BLUE CROSS/BLUE SHIELD

## 2016-05-21 ENCOUNTER — Ambulatory Visit (INDEPENDENT_AMBULATORY_CARE_PROVIDER_SITE_OTHER): Payer: BLUE CROSS/BLUE SHIELD | Admitting: Podiatry

## 2016-05-21 ENCOUNTER — Encounter: Payer: Self-pay | Admitting: Podiatry

## 2016-05-21 VITALS — BP 125/85 | HR 85 | Resp 16 | Ht 66.0 in | Wt 180.0 lb

## 2016-05-21 DIAGNOSIS — M79671 Pain in right foot: Secondary | ICD-10-CM

## 2016-05-21 DIAGNOSIS — M7661 Achilles tendinitis, right leg: Secondary | ICD-10-CM | POA: Diagnosis not present

## 2016-05-21 DIAGNOSIS — M722 Plantar fascial fibromatosis: Secondary | ICD-10-CM

## 2016-05-21 DIAGNOSIS — M79672 Pain in left foot: Secondary | ICD-10-CM

## 2016-05-21 MED ORDER — TRIAMCINOLONE ACETONIDE 10 MG/ML IJ SUSP
10.0000 mg | Freq: Once | INTRAMUSCULAR | Status: AC
  Administered 2016-05-21: 10 mg

## 2016-05-21 MED ORDER — METHYLPREDNISOLONE 4 MG PO TBPK: ORAL_TABLET | ORAL | 0 refills | Status: DC

## 2016-05-21 NOTE — Progress Notes (Signed)
   Subjective:    Patient ID: Gina Moreno, female    DOB: Sep 29, 1985, 30 y.o.   MRN: [id number]  HPI Chief Complaint  Patient presents with  . Foot Pain    Bilateral; Left foot-heel; Right foot; back of heel; pt stated, "On left foot, hurts more in the morning"; Since March 2017      Review of Systems  Psychiatric/Behavioral: The patient is nervous/anxious.   All other systems reviewed and are negative.      Objective:   Physical Exam        Assessment & Plan:

## 2016-05-21 NOTE — Patient Instructions (Addendum)
Plantar Fasciitis (Heel Spur Syndrome) with Rehab The plantar fascia is a fibrous, ligament-like, soft-tissue structure that spans the bottom of the foot. Plantar fasciitis is a condition that causes pain in the foot due to inflammation of the tissue. SYMPTOMS   Pain and tenderness on the underneath side of the foot.  Pain that worsens with standing or walking. CAUSES  Plantar fasciitis is caused by irritation and injury to the plantar fascia on the underneath side of the foot. Common mechanisms of injury include:  Direct trauma to bottom of the foot.  Damage to a small nerve that runs under the foot where the main fascia attaches to the heel bone.  Stress placed on the plantar fascia due to bone spurs. RISK INCREASES WITH:   Activities that place stress on the plantar fascia (running, jumping, pivoting, or cutting).  Poor strength and flexibility.  Improperly fitted shoes.  Tight calf muscles.  Flat feet.  Failure to warm-up properly before activity.  Obesity. PREVENTION  Warm up and stretch properly before activity.  Allow for adequate recovery between workouts.  Maintain physical fitness:  Strength, flexibility, and endurance.  Cardiovascular fitness.  Maintain a health body weight.  Avoid stress on the plantar fascia.  Wear properly fitted shoes, including arch supports for individuals who have flat feet.  PROGNOSIS  If treated properly, then the symptoms of plantar fasciitis usually resolve without surgery. However, occasionally surgery is necessary.  RELATED COMPLICATIONS   Recurrent symptoms that may result in a chronic condition.  Problems of the lower back that are caused by compensating for the injury, such as limping.  Pain or weakness of the foot during push-off following surgery.  Chronic inflammation, scarring, and partial or complete fascia tear, occurring more often from repeated injections.  TREATMENT  Treatment initially involves the  use of ice and medication to help reduce pain and inflammation. The use of strengthening and stretching exercises may help reduce pain with activity, especially stretches of the Achilles tendon. These exercises may be performed at home or with a therapist. Your caregiver may recommend that you use heel cups of arch supports to help reduce stress on the plantar fascia. Occasionally, corticosteroid injections are given to reduce inflammation. If symptoms persist for greater than 6 months despite non-surgical (conservative), then surgery may be recommended.   MEDICATION   If pain medication is necessary, then nonsteroidal anti-inflammatory medications, such as aspirin and ibuprofen, or other minor pain relievers, such as acetaminophen, are often recommended.  Do not take pain medication within 7 days before surgery.  Prescription pain relievers may be given if deemed necessary by your caregiver. Use only as directed and only as much as you need.  Corticosteroid injections may be given by your caregiver. These injections should be reserved for the most serious cases, because they may only be given a certain number of times.  HEAT AND COLD  Cold treatment (icing) relieves pain and reduces inflammation. Cold treatment should be applied for 10 to 15 minutes every 2 to 3 hours for inflammation and pain and immediately after any activity that aggravates your symptoms. Use ice packs or massage the area with a piece of ice (ice massage).  Heat treatment may be used prior to performing the stretching and strengthening activities prescribed by your caregiver, physical therapist, or athletic trainer. Use a heat pack or soak the injury in warm water.  SEEK IMMEDIATE MEDICAL CARE IF:  Treatment seems to offer no benefit, or the condition worsens.  Any medications   produce adverse side effects.  EXERCISES- RANGE OF MOTION (ROM) AND STRETCHING EXERCISES - Plantar Fasciitis (Heel Spur Syndrome) These exercises  may help you when beginning to rehabilitate your injury. Your symptoms may resolve with or without further involvement from your physician, physical therapist or athletic trainer. While completing these exercises, remember:   Restoring tissue flexibility helps normal motion to return to the joints. This allows healthier, less painful movement and activity.  An effective stretch should be held for at least 30 seconds.  A stretch should never be painful. You should only feel a gentle lengthening or release in the stretched tissue.  RANGE OF MOTION - Toe Extension, Flexion  Sit with your right / left leg crossed over your opposite knee.  Grasp your toes and gently pull them back toward the top of your foot. You should feel a stretch on the bottom of your toes and/or foot.  Hold this stretch for 10 seconds.  Now, gently pull your toes toward the bottom of your foot. You should feel a stretch on the top of your toes and or foot.  Hold this stretch for 10 seconds. Repeat  times. Complete this stretch 3 times per day.   RANGE OF MOTION - Ankle Dorsiflexion, Active Assisted  Remove shoes and sit on a chair that is preferably not on a carpeted surface.  Place right / left foot under knee. Extend your opposite leg for support.  Keeping your heel down, slide your right / left foot back toward the chair until you feel a stretch at your ankle or calf. If you do not feel a stretch, slide your bottom forward to the edge of the chair, while still keeping your heel down.  Hold this stretch for 10 seconds. Repeat 3 times. Complete this stretch 2 times per day.   STRETCH  Gastroc, Standing  Place hands on wall.  Extend right / left leg, keeping the front knee somewhat bent.  Slightly point your toes inward on your back foot.  Keeping your right / left heel on the floor and your knee straight, shift your weight toward the wall, not allowing your back to arch.  You should feel a gentle stretch  in the right / left calf. Hold this position for 10 seconds. Repeat 3 times. Complete this stretch 2 times per day.  STRETCH  Soleus, Standing  Place hands on wall.  Extend right / left leg, keeping the other knee somewhat bent.  Slightly point your toes inward on your back foot.  Keep your right / left heel on the floor, bend your back knee, and slightly shift your weight over the back leg so that you feel a gentle stretch deep in your back calf.  Hold this position for 10 seconds. Repeat 3 times. Complete this stretch 2 times per day.  STRETCH  Gastrocsoleus, Standing  Note: This exercise can place a lot of stress on your foot and ankle. Please complete this exercise only if specifically instructed by your caregiver.   Place the ball of your right / left foot on a step, keeping your other foot firmly on the same step.  Hold on to the wall or a rail for balance.  Slowly lift your other foot, allowing your body weight to press your heel down over the edge of the step.  You should feel a stretch in your right / left calf.  Hold this position for 10 seconds.  Repeat this exercise with a slight bend in your right /   left knee. Repeat 3 times. Complete this stretch 2 times per day.   STRENGTHENING EXERCISES - Plantar Fasciitis (Heel Spur Syndrome)  These exercises may help you when beginning to rehabilitate your injury. They may resolve your symptoms with or without further involvement from your physician, physical therapist or athletic trainer. While completing these exercises, remember:   Muscles can gain both the endurance and the strength needed for everyday activities through controlled exercises.  Complete these exercises as instructed by your physician, physical therapist or athletic trainer. Progress the resistance and repetitions only as guided.  STRENGTH - Towel Curls  Sit in a chair positioned on a non-carpeted surface.  Place your foot on a towel, keeping your heel  on the floor.  Pull the towel toward your heel by only curling your toes. Keep your heel on the floor. Repeat 3 times. Complete this exercise 2 times per day.  STRENGTH - Ankle Inversion  Secure one end of a rubber exercise band/tubing to a fixed object (table, pole). Loop the other end around your foot just before your toes.  Place your fists between your knees. This will focus your strengthening at your ankle.  Slowly, pull your big toe up and in, making sure the band/tubing is positioned to resist the entire motion.  Hold this position for 10 seconds.  Have your muscles resist the band/tubing as it slowly pulls your foot back to the starting position. Repeat 3 times. Complete this exercises 2 times per day.  Document Released: 08/06/2005 Document Revised: 10/29/2011 Document Reviewed: 11/18/2008 [employer] Patient Information 2014 [employer]   Achilles Tendinitis  with Rehab Achilles tendinitis is a disorder of the Achilles tendon. The Achilles tendon connects the large calf muscles (Gastrocnemius and Soleus) to the heel bone (calcaneus). This tendon is sometimes called the heel cord. It is important for pushing-off and standing on your toes and is important for walking, running, or jumping. Tendinitis is often caused by overuse and repetitive microtrauma. SYMPTOMS  Pain, tenderness, swelling, warmth, and redness may occur over the Achilles tendon even at rest.  Pain with pushing off, or flexing or extending the ankle.  Pain that is worsened after or during activity. CAUSES   Overuse sometimes seen with rapid increase in exercise programs or in sports requiring running and jumping.  Poor physical conditioning (strength and flexibility or endurance).  Running sports, especially training running down hills.  Inadequate warm-up before practice or play or failure to stretch before participation.  Injury to the tendon. PREVENTION   Warm up and stretch before practice or  competition.  Allow time for adequate rest and recovery between practices and competition.  Keep up conditioning.  Keep up ankle and leg flexibility.  Improve or keep muscle strength and endurance.  Improve cardiovascular fitness.  Use proper technique.  Use proper equipment (shoes, skates).  To help prevent recurrence, taping, protective strapping, or an adhesive bandage may be recommended for several weeks after healing is complete. PROGNOSIS   Recovery may take weeks to several months to heal.  Longer recovery is expected if symptoms have been prolonged.  Recovery is usually quicker if the inflammation is due to a direct blow as compared with overuse or sudden strain. RELATED COMPLICATIONS   Healing time will be prolonged if the condition is not correctly treated. The injury must be given plenty of time to heal.  Symptoms can reoccur if activity is resumed too soon.  Untreated, tendinitis may increase the risk of tendon rupture requiring additional time   time for recovery and possibly surgery. TREATMENT   The first treatment consists of rest anti-inflammatory medication, and ice to relieve the pain.  Stretching and strengthening exercises after resolution of pain will likely help reduce the risk of recurrence. Referral to a physical therapist or athletic trainer for further evaluation and treatment may be helpful.  A walking boot or cast may be recommended to rest the Achilles tendon. This can help break the cycle of inflammation and microtrauma.  Arch supports (orthotics) may be prescribed or recommended by your caregiver as an adjunct to therapy and rest.  Surgery to remove the inflamed tendon lining or degenerated tendon tissue is rarely necessary and has shown less than predictable results. MEDICATION   Nonsteroidal anti-inflammatory medications, such as aspirin and ibuprofen, may be used for pain and inflammation relief. Do not take within 7 days before surgery. Take  these as directed by your caregiver. Contact your caregiver immediately if any bleeding, stomach upset, or signs of allergic reaction occur. Other minor pain relievers, such as acetaminophen, may also be used.  Pain relievers may be prescribed as necessary by your caregiver. Do not take prescription pain medication for longer than 4 to 7 days. Use only as directed and only as much as you need.  Cortisone injections are rarely indicated. Cortisone injections may weaken tendons and predispose to rupture. It is better to give the condition more time to heal than to use them. HEAT AND COLD  Cold is used to relieve pain and reduce inflammation for acute and chronic Achilles tendinitis. Cold should be applied for 10 to 15 minutes every 2 to 3 hours for inflammation and pain and immediately after any activity that aggravates your symptoms. Use ice packs or an ice massage.  Heat may be used before performing stretching and strengthening activities prescribed by your caregiver. Use a heat pack or a warm soak. SEEK MEDICAL CARE IF:  Symptoms get worse or do not improve in 2 weeks despite treatment.  New, unexplained symptoms develop. Drugs used in treatment may produce side effects.  EXERCISES:  RANGE OF MOTION (ROM) AND STRETCHING EXERCISES - Achilles Tendinitis  These exercises may help you when beginning to rehabilitate your injury. Your symptoms may resolve with or without further involvement from your physician, physical therapist or athletic trainer. While completing these exercises, remember:   Restoring tissue flexibility helps normal motion to return to the joints. This allows healthier, less painful movement and activity.  An effective stretch should be held for at least 30 seconds.  A stretch should never be painful. You should only feel a gentle lengthening or release in the stretched tissue.  STRETCH  Gastroc, Standing   Place hands on wall.  Extend right / left leg, keeping the  front knee somewhat bent.  Slightly point your toes inward on your back foot.  Keeping your right / left heel on the floor and your knee straight, shift your weight toward the wall, not allowing your back to arch.  You should feel a gentle stretch in the right / left calf. Hold this position for 10 seconds. Repeat 3 times. Complete this stretch 2 times per day.  STRETCH  Soleus, Standing   Place hands on wall.  Extend right / left leg, keeping the other knee somewhat bent.  Slightly point your toes inward on your back foot.  Keep your right / left heel on the floor, bend your back knee, and slightly shift your weight over the back leg so that  you feel a gentle stretch deep in your back calf.  Hold this position for 10 seconds. Repeat 3 times. Complete this stretch 2 times per day.  STRETCH  Gastrocsoleus, Standing  Note: This exercise can place a lot of stress on your foot and ankle. Please complete this exercise only if specifically instructed by your caregiver.   Place the ball of your right / left foot on a step, keeping your other foot firmly on the same step.  Hold on to the wall or a rail for balance.  Slowly lift your other foot, allowing your body weight to press your heel down over the edge of the step.  You should feel a stretch in your right / left calf.  Hold this position for 10 seconds.  Repeat this exercise with a slight bend in your knee. Repeat 3 times. Complete this stretch 2 times per day.   STRENGTHENING EXERCISES - Achilles Tendinitis These exercises may help you when beginning to rehabilitate your injury. They may resolve your symptoms with or without further involvement from your physician, physical therapist or athletic trainer. While completing these exercises, remember:   Muscles can gain both the endurance and the strength needed for everyday activities through controlled exercises.  Complete these exercises as instructed by your physician,  physical therapist or athletic trainer. Progress the resistance and repetitions only as guided.  You may experience muscle soreness or fatigue, but the pain or discomfort you are trying to eliminate should never worsen during these exercises. If this pain does worsen, stop and make certain you are following the directions exactly. If the pain is still present after adjustments, discontinue the exercise until you can discuss the trouble with your clinician.  STRENGTH - Plantar-flexors   Sit with your right / left leg extended. Holding onto both ends of a rubber exercise band/tubing, loop it around the ball of your foot. Keep a slight tension in the band.  Slowly push your toes away from you, pointing them downward.  Hold this position for 10 seconds. Return slowly, controlling the tension in the band/tubing. Repeat 3 times. Complete this exercise 2 times per day.   STRENGTH - Plantar-flexors   Stand with your feet shoulder width apart. Steady yourself with a wall or table using as little support as needed.  Keeping your weight evenly spread over the width of your feet, rise up on your toes.*  Hold this position for 10 seconds. Repeat 3 times. Complete this exercise 2 times per day.  *If this is too easy, shift your weight toward your right / left leg until you feel challenged. Ultimately, you may be asked to do this exercise with your right / left foot only.  STRENGTH  Plantar-flexors, Eccentric  Note: This exercise can place a lot of stress on your foot and ankle. Please complete this exercise only if specifically instructed by your caregiver.   Place the balls of your feet on a step. With your hands, use only enough support from a wall or rail to keep your balance.  Keep your knees straight and rise up on your toes.  Slowly shift your weight entirely to your right / left toes and pick up your opposite foot. Gently and with controlled movement, lower your weight through your right /  left foot so that your heel drops below the level of the step. You will feel a slight stretch in the back of your calf at the end position.  Use the healthy leg to help  rise up onto the balls of both feet, then lower weight only on the right / left leg again. Build up to 15 repetitions. Then progress to 3 consecutive sets of 15 repetitions.*  After completing the above exercise, complete the same exercise with a slight knee bend (about 30 degrees). Again, build up to 15 repetitions. Then progress to 3 consecutive sets of 15 repetitions.* Perform this exercise 2 times per day.  *When you easily complete 3 sets of 15, your physician, physical therapist or athletic trainer may advise you to add resistance by wearing a backpack filled with additional weight.  STRENGTH - Plantar Flexors, Seated   Sit on a chair that allows your feet to rest flat on the ground. If necessary, sit at the edge of the chair.  Keeping your toes firmly on the ground, lift your right / left heel as far as you can without increasing any discomfort in your ankle. Repeat 3 times. Complete this exercise 2 times a day.   Achilles Tendinitis  with Rehab Achilles tendinitis is a disorder of the Achilles tendon. The Achilles tendon connects the large calf muscles (Gastrocnemius and Soleus) to the heel bone (calcaneus). This tendon is sometimes called the heel cord. It is important for pushing-off and standing on your toes and is important for walking, running, or jumping. Tendinitis is often caused by overuse and repetitive microtrauma. SYMPTOMS  Pain, tenderness, swelling, warmth, and redness may occur over the Achilles tendon even at rest.  Pain with pushing off, or flexing or extending the ankle.  Pain that is worsened after or during activity. CAUSES   Overuse sometimes seen with rapid increase in exercise programs or in sports requiring running and jumping.  Poor physical conditioning (strength and flexibility or  endurance).  Running sports, especially training running down hills.  Inadequate warm-up before practice or play or failure to stretch before participation.  Injury to the tendon. PREVENTION   Warm up and stretch before practice or competition.  Allow time for adequate rest and recovery between practices and competition.  Keep up conditioning.  Keep up ankle and leg flexibility.  Improve or keep muscle strength and endurance.  Improve cardiovascular fitness.  Use proper technique.  Use proper equipment (shoes, skates).  To help prevent recurrence, taping, protective strapping, or an adhesive bandage may be recommended for several weeks after healing is complete. PROGNOSIS   Recovery may take weeks to several months to heal.  Longer recovery is expected if symptoms have been prolonged.  Recovery is usually quicker if the inflammation is due to a direct blow as compared with overuse or sudden strain. RELATED COMPLICATIONS   Healing time will be prolonged if the condition is not correctly treated. The injury must be given plenty of time to heal.  Symptoms can reoccur if activity is resumed too soon.  Untreated, tendinitis may increase the risk of tendon rupture requiring additional time for recovery and possibly surgery. TREATMENT   The first treatment consists of rest anti-inflammatory medication, and ice to relieve the pain.  Stretching and strengthening exercises after resolution of pain will likely help reduce the risk of recurrence. Referral to a physical therapist or athletic trainer for further evaluation and treatment may be helpful.  A walking boot or cast may be recommended to rest the Achilles tendon. This can help break the cycle of inflammation and microtrauma.  Arch supports (orthotics) may be prescribed or recommended by your caregiver as an adjunct to therapy and rest.  Surgery to  remove the inflamed tendon lining or degenerated tendon tissue is rarely  necessary and has shown less than predictable results. MEDICATION   Nonsteroidal anti-inflammatory medications, such as aspirin and ibuprofen, may be used for pain and inflammation relief. Do not take within 7 days before surgery. Take these as directed by your caregiver. Contact your caregiver immediately if any bleeding, stomach upset, or signs of allergic reaction occur. Other minor pain relievers, such as acetaminophen, may also be used.  Pain relievers may be prescribed as necessary by your caregiver. Do not take prescription pain medication for longer than 4 to 7 days. Use only as directed and only as much as you need.  Cortisone injections are rarely indicated. Cortisone injections may weaken tendons and predispose to rupture. It is better to give the condition more time to heal than to use them. HEAT AND COLD  Cold is used to relieve pain and reduce inflammation for acute and chronic Achilles tendinitis. Cold should be applied for 10 to 15 minutes every 2 to 3 hours for inflammation and pain and immediately after any activity that aggravates your symptoms. Use ice packs or an ice massage.  Heat may be used before performing stretching and strengthening activities prescribed by your caregiver. Use a heat pack or a warm soak. SEEK MEDICAL CARE IF:  Symptoms get worse or do not improve in 2 weeks despite treatment.  New, unexplained symptoms develop. Drugs used in treatment may produce side effects.  EXERCISES:  RANGE OF MOTION (ROM) AND STRETCHING EXERCISES - Achilles Tendinitis  These exercises may help you when beginning to rehabilitate your injury. Your symptoms may resolve with or without further involvement from your physician, physical therapist or athletic trainer. While completing these exercises, remember:   Restoring tissue flexibility helps normal motion to return to the joints. This allows healthier, less painful movement and activity.  An effective stretch should be held  for at least 30 seconds.  A stretch should never be painful. You should only feel a gentle lengthening or release in the stretched tissue.  STRETCH  Gastroc, Standing   Place hands on wall.  Extend right / left leg, keeping the front knee somewhat bent.  Slightly point your toes inward on your back foot.  Keeping your right / left heel on the floor and your knee straight, shift your weight toward the wall, not allowing your back to arch.  You should feel a gentle stretch in the right / left calf. Hold this position for 10 seconds. Repeat 3 times. Complete this stretch 2 times per day.  STRETCH  Soleus, Standing   Place hands on wall.  Extend right / left leg, keeping the other knee somewhat bent.  Slightly point your toes inward on your back foot.  Keep your right / left heel on the floor, bend your back knee, and slightly shift your weight over the back leg so that you feel a gentle stretch deep in your back calf.  Hold this position for 10 seconds. Repeat 3 times. Complete this stretch 2 times per day.  STRETCH  Gastrocsoleus, Standing  Note: This exercise can place a lot of stress on your foot and ankle. Please complete this exercise only if specifically instructed by your caregiver.   Place the ball of your right / left foot on a step, keeping your other foot firmly on the same step.  Hold on to the wall or a rail for balance.  Slowly lift your other foot, allowing your body weight  to press your heel down over the edge of the step.  You should feel a stretch in your right / left calf.  Hold this position for 10 seconds.  Repeat this exercise with a slight bend in your knee. Repeat 3 times. Complete this stretch 2 times per day.   STRENGTHENING EXERCISES - Achilles Tendinitis These exercises may help you when beginning to rehabilitate your injury. They may resolve your symptoms with or without further involvement from your physician, physical therapist or athletic  trainer. While completing these exercises, remember:   Muscles can gain both the endurance and the strength needed for everyday activities through controlled exercises.  Complete these exercises as instructed by your physician, physical therapist or athletic trainer. Progress the resistance and repetitions only as guided.  You may experience muscle soreness or fatigue, but the pain or discomfort you are trying to eliminate should never worsen during these exercises. If this pain does worsen, stop and make certain you are following the directions exactly. If the pain is still present after adjustments, discontinue the exercise until you can discuss the trouble with your clinician.  STRENGTH - Plantar-flexors   Sit with your right / left leg extended. Holding onto both ends of a rubber exercise band/tubing, loop it around the ball of your foot. Keep a slight tension in the band.  Slowly push your toes away from you, pointing them downward.  Hold this position for 10 seconds. Return slowly, controlling the tension in the band/tubing. Repeat 3 times. Complete this exercise 2 times per day.   STRENGTH - Plantar-flexors   Stand with your feet shoulder width apart. Steady yourself with a wall or table using as little support as needed.  Keeping your weight evenly spread over the width of your feet, rise up on your toes.*  Hold this position for 10 seconds. Repeat 3 times. Complete this exercise 2 times per day.  *If this is too easy, shift your weight toward your right / left leg until you feel challenged. Ultimately, you may be asked to do this exercise with your right / left foot only.  STRENGTH  Plantar-flexors, Eccentric  Note: This exercise can place a lot of stress on your foot and ankle. Please complete this exercise only if specifically instructed by your caregiver.   Place the balls of your feet on a step. With your hands, use only enough support from a wall or rail to keep your  balance.  Keep your knees straight and rise up on your toes.  Slowly shift your weight entirely to your right / left toes and pick up your opposite foot. Gently and with controlled movement, lower your weight through your right / left foot so that your heel drops below the level of the step. You will feel a slight stretch in the back of your calf at the end position.  Use the healthy leg to help rise up onto the balls of both feet, then lower weight only on the right / left leg again. Build up to 15 repetitions. Then progress to 3 consecutive sets of 15 repetitions.*  After completing the above exercise, complete the same exercise with a slight knee bend (about 30 degrees). Again, build up to 15 repetitions. Then progress to 3 consecutive sets of 15 repetitions.* Perform this exercise 2 times per day.  *When you easily complete 3 sets of 15, your physician, physical therapist or athletic trainer may advise you to add resistance by wearing a backpack filled with additional  weight.  STRENGTH - Plantar Flexors, Seated   Sit on a chair that allows your feet to rest flat on the ground. If necessary, sit at the edge of the chair.  Keeping your toes firmly on the ground, lift your right / left heel as far as you can without increasing any discomfort in your ankle. Repeat 3 times. Complete this exercise 2 times a day.

## 2016-05-24 ENCOUNTER — Other Ambulatory Visit: Payer: Self-pay | Admitting: Family Medicine

## 2016-05-24 NOTE — Progress Notes (Signed)
Subjective:     Patient ID: Gina Moreno, female   DOB: Sep 08, 1985, 30 y.o.   MRN: [id number]  HPI patient states the plantar of the left heel has been bothering her and she's also started to develop pain on the posterior heel right with inflammation fluid buildup. States that it's been going on for about 6 months and is gradually worsened over that time and is worse when she gets up in the morning or after periods of sitting   Review of Systems  All other systems reviewed and are negative.      Objective:   Physical Exam  Constitutional: She is oriented to person, place, and time.  Cardiovascular: Intact distal pulses.   Musculoskeletal: Normal range of motion.  Neurological: She is oriented to person, place, and time.  Skin: Skin is warm.  Nursing note and vitals reviewed.  neurovascular status intact muscle strength adequate range of motion within normal limits with patient found to have discomfort in the plantar aspect of the left heel at the insertional point of the tendon into the calcaneus with inflammation fluid buildup and noted on the posterior aspect of the right heel medial side to have irritation inflammation with pain upon palpation. Patient's found to have good digital perfusion and is well oriented 3     Assessment:     Inflammatory fasciitis plantar heel left with Achilles tendinitis right which is probably compensatory in nature    Plan:     H&P x-ray reviewed and injected the left plantar fashion 3 mg Kenalog 5 mg Xylocaine and applied fascial brace placed on anti-inflammatory and discussed shoe gear modifications along with stretching. Patient will be seen back in about 2-3 weeks and may require treatment of right depending on the response over the next several weeks  X-ray report indicated no indications of spurring or stress fracture

## 2016-05-26 IMAGING — US US ABDOMEN COMPLETE
1 series · 13 of 25 positions shown · non-contrast
Comparison: None.

CLINICAL DATA: Right upper quadrant pain for 1 month with nausea.

EXAM:
ABDOMEN ULTRASOUND COMPLETE

[Series 1: us abdomen complete · 0.17mm/px · 13 of 85 slices shown]
[im 1/85]
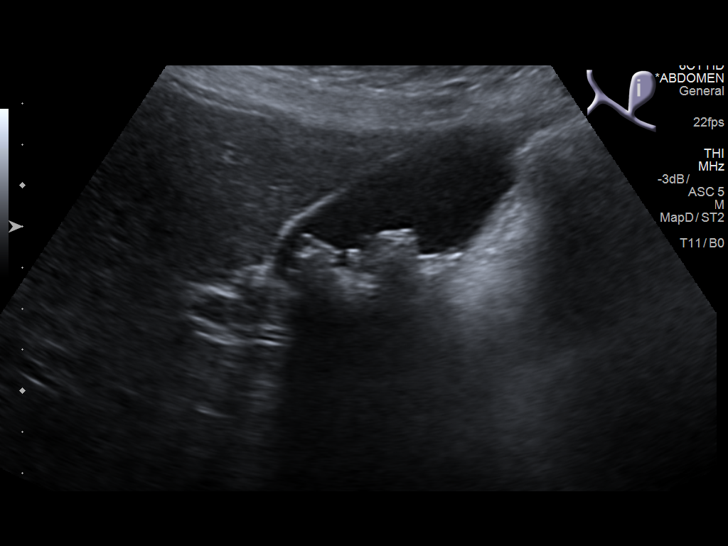
[im 8/85]
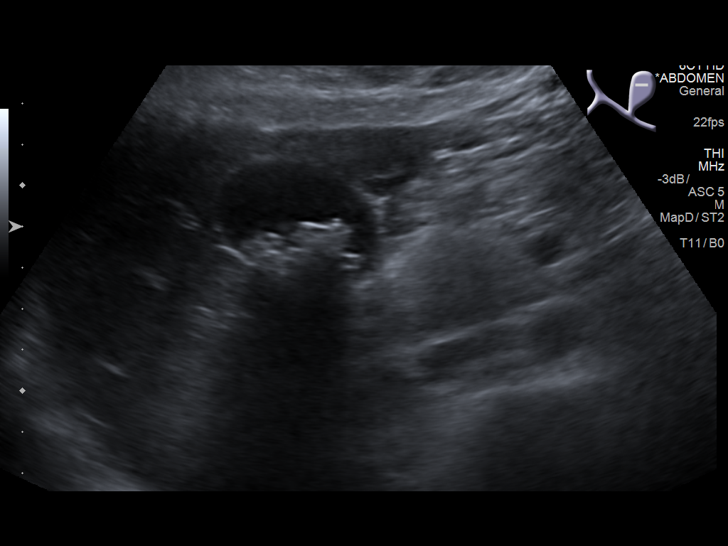
[im 15/85]
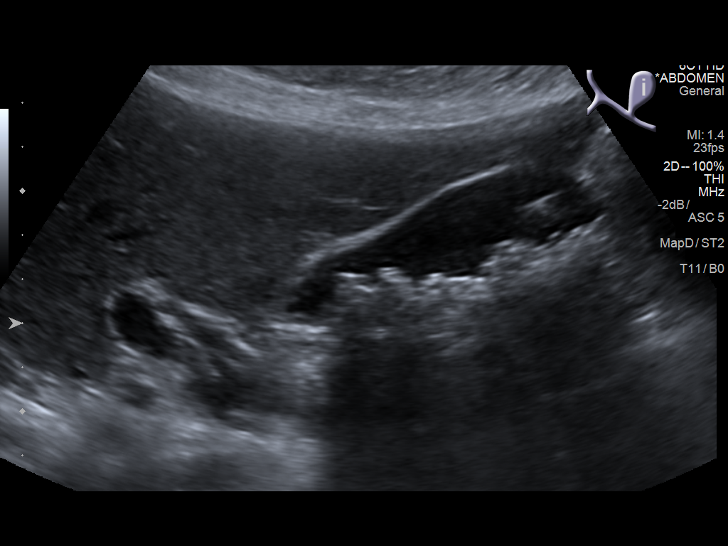
[im 22/85]
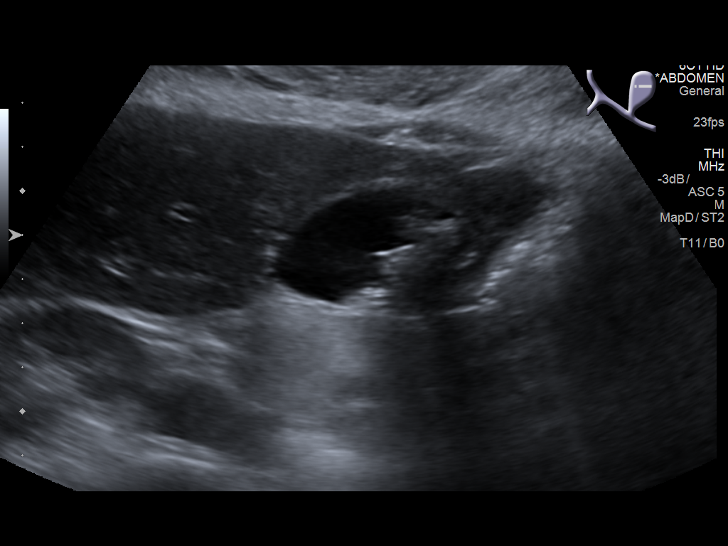
[im 29/85]
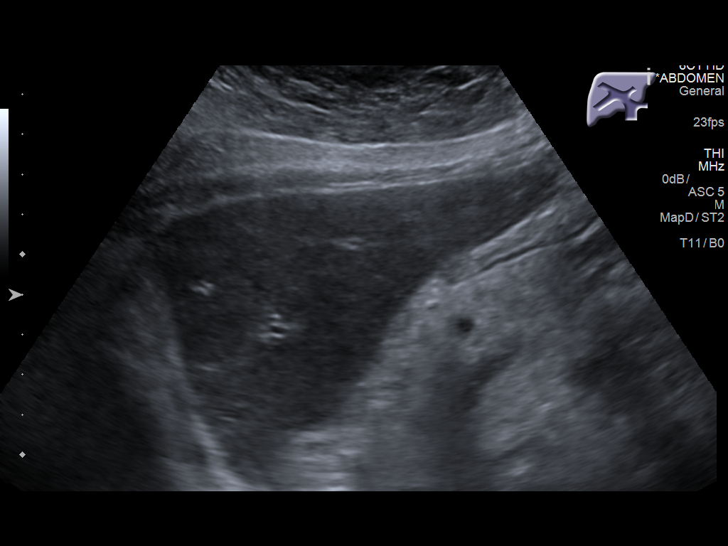
[im 36/85]
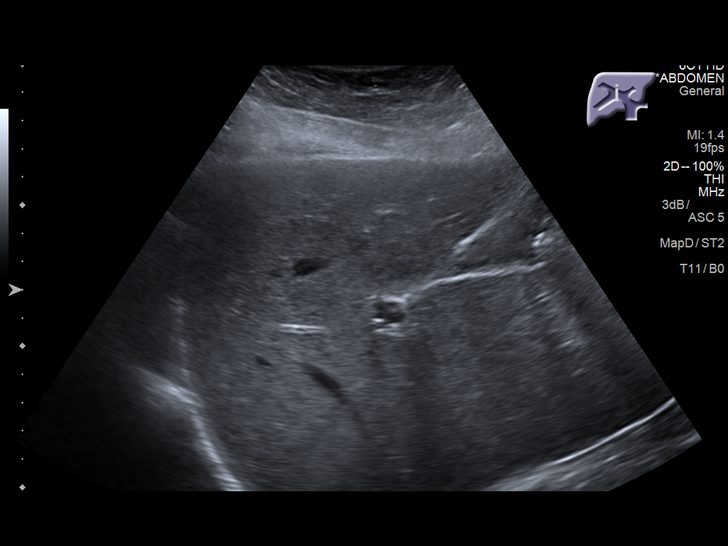
[im 43/85]
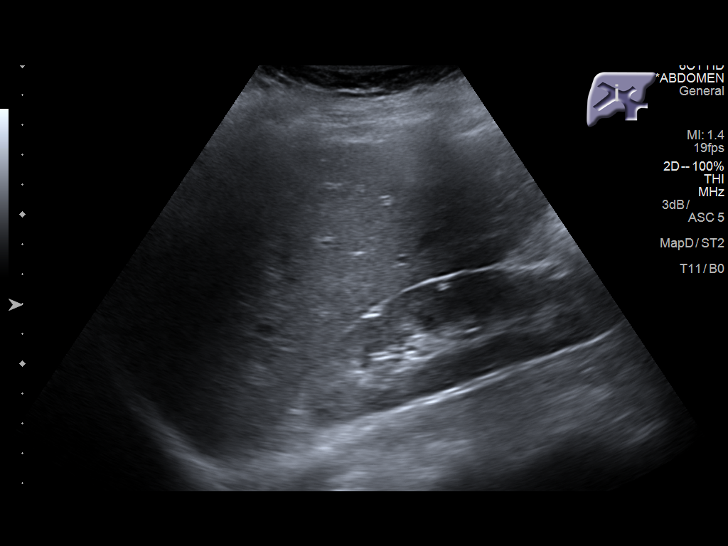
[im 50/85]
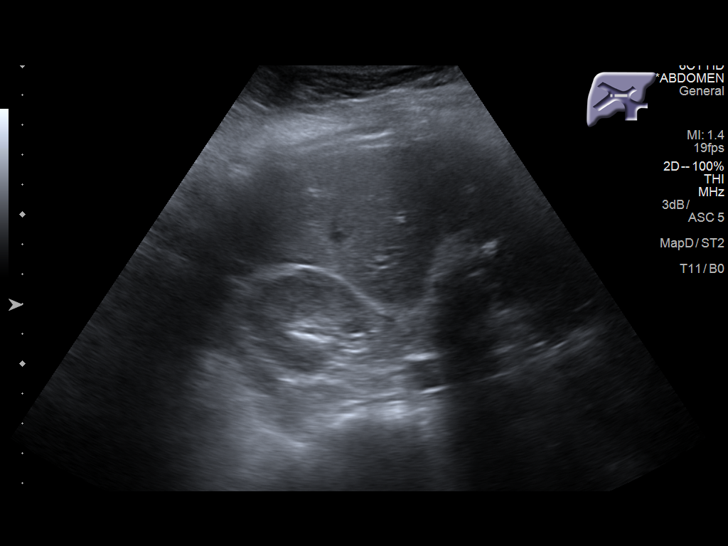
[im 57/85]
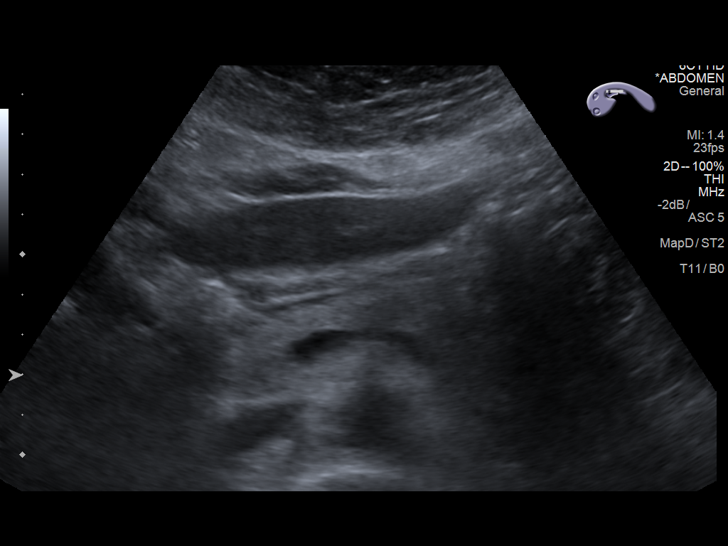
[im 64/85]
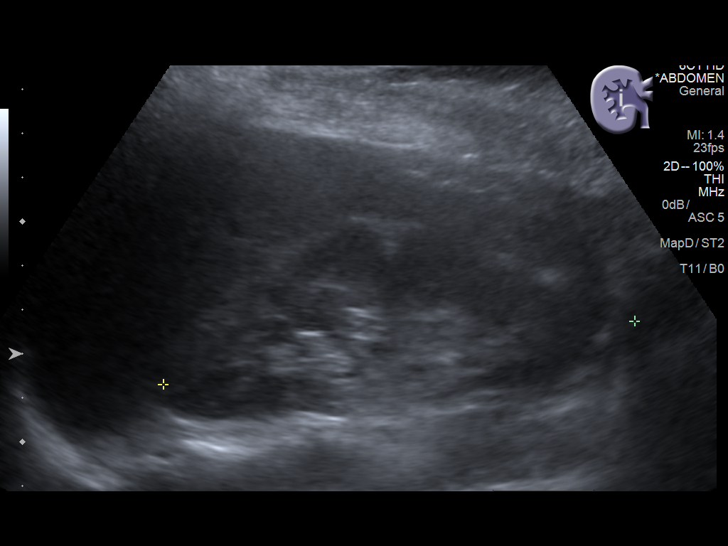
[im 71/85]
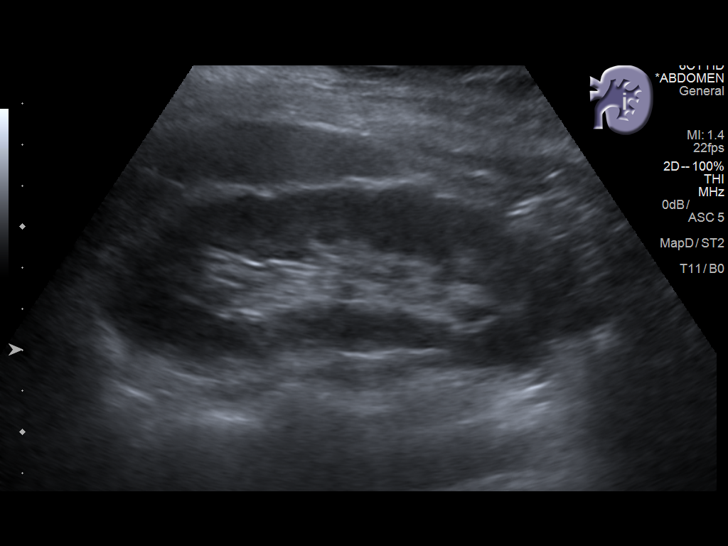
[im 78/85]
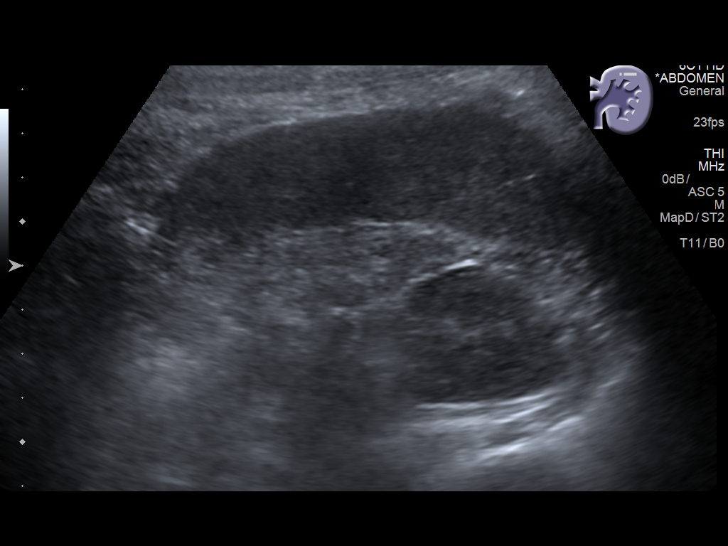
[im 85/85]
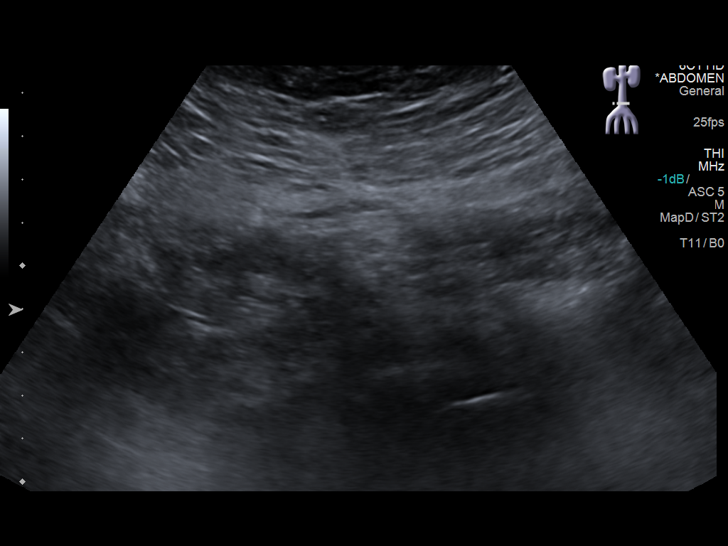

[13 of 25 positions shown; findings below may reference images not displayed]

FINDINGS: Gallbladder: Cholelithiasis without pericholecystic fluid or
gallbladder wall thickening. Multiple gallstones in the gallbladder
neck. Positive sonographic Murphy sign reported by the performing
sonographer.

Common bile duct: Diameter: 2.4 mm

Liver: No focal lesion identified. Within normal limits in
parenchymal echogenicity.

IVC: Limited visualization secondary to overlying bowel gas.

Pancreas: Limited visualization secondary to overlying bowel gas.

Spleen: Size and appearance within normal limits.

Right Kidney: Length: 10.8 cm. Echogenicity within normal limits. No
mass or hydronephrosis visualized.

Left Kidney: Length: 11.4 cm. Echogenicity within normal limits. No
mass or hydronephrosis visualized.

Abdominal aorta: No aneurysm visualized.

Other findings: None.
IMPRESSION: 1. Cholelithiasis without significant gallbladder wall thickening or
pericholecystic fluid, but with a positive sonographic Murphy sign.
Image 3 of the gallstones are located within the gallbladder neck.
The findings are concerning for, but not diagnostic of acute
cholecystitis.

## 2016-05-28 ENCOUNTER — Encounter: Payer: Self-pay | Admitting: Podiatry

## 2016-05-28 ENCOUNTER — Encounter: Payer: Self-pay | Admitting: Family Medicine

## 2016-05-28 ENCOUNTER — Ambulatory Visit (INDEPENDENT_AMBULATORY_CARE_PROVIDER_SITE_OTHER): Payer: BLUE CROSS/BLUE SHIELD | Admitting: Podiatry

## 2016-05-28 DIAGNOSIS — M722 Plantar fascial fibromatosis: Secondary | ICD-10-CM

## 2016-05-28 DIAGNOSIS — M7661 Achilles tendinitis, right leg: Secondary | ICD-10-CM

## 2016-05-29 NOTE — Progress Notes (Signed)
Subjective:     Patient ID: Gina Moreno, female   DOB: 10-23-1985, 30 y.o.   MRN: [id number]  HPI patient presents stating I seem to be doing a lot better with minimal discomfort   Review of Systems     Objective:   Physical Exam Neurovascular status intact muscle strength was adequate patient found to have reduced discomfort plantar heel left with mild Achilles tendinitis right that also appears to be improving    Assessment:     Plantar fasciitis along with Achilles tendinitis which is probably compensatory    Plan:     Advised on physical therapy anti-inflammatories supportive shoe gear usage and ice therapy. Reappoint if symptoms get worse or do not improve

## 2016-06-18 ENCOUNTER — Other Ambulatory Visit (HOSPITAL_BASED_OUTPATIENT_CLINIC_OR_DEPARTMENT_OTHER): Payer: Self-pay | Admitting: Obstetrics & Gynecology

## 2016-06-18 DIAGNOSIS — R1084 Generalized abdominal pain: Secondary | ICD-10-CM

## 2016-06-21 ENCOUNTER — Ambulatory Visit (HOSPITAL_BASED_OUTPATIENT_CLINIC_OR_DEPARTMENT_OTHER)
Admission: RE | Admit: 2016-06-21 | Discharge: 2016-06-21 | Disposition: A | Discharge status: Home / Self Care | Payer: BLUE CROSS/BLUE SHIELD | Source: Ambulatory Visit | Attending: Obstetrics & Gynecology | Admitting: Obstetrics & Gynecology

## 2016-06-21 ENCOUNTER — Encounter (HOSPITAL_BASED_OUTPATIENT_CLINIC_OR_DEPARTMENT_OTHER): Payer: Self-pay

## 2016-06-21 ENCOUNTER — Other Ambulatory Visit: Payer: Self-pay | Admitting: Obstetrics & Gynecology

## 2016-06-21 DIAGNOSIS — R109 Unspecified abdominal pain: Secondary | ICD-10-CM | POA: Insufficient documentation

## 2016-06-21 DIAGNOSIS — Z9049 Acquired absence of other specified parts of digestive tract: Secondary | ICD-10-CM | POA: Diagnosis not present

## 2016-06-21 DIAGNOSIS — N6489 Other specified disorders of breast: Secondary | ICD-10-CM | POA: Diagnosis not present

## 2016-06-21 DIAGNOSIS — R1084 Generalized abdominal pain: Secondary | ICD-10-CM

## 2016-06-21 DIAGNOSIS — N6310 Unspecified lump in the right breast, unspecified quadrant: Secondary | ICD-10-CM

## 2016-06-21 MED ORDER — IOPAMIDOL (ISOVUE-300) INJECTION 61%
100.0000 mL | Freq: Once | INTRAVENOUS | Status: AC | PRN
  Administered 2016-06-21: 100 mL via INTRAVENOUS

## 2016-07-17 ENCOUNTER — Other Ambulatory Visit: Payer: Self-pay | Admitting: Obstetrics & Gynecology

## 2016-07-17 ENCOUNTER — Ambulatory Visit
Admission: RE | Admit: 2016-07-17 | Discharge: 2016-07-17 | Disposition: A | Discharge status: Home / Self Care | Payer: BLUE CROSS/BLUE SHIELD | Source: Ambulatory Visit | Attending: Obstetrics & Gynecology | Admitting: Obstetrics & Gynecology

## 2016-07-17 DIAGNOSIS — N6310 Unspecified lump in the right breast, unspecified quadrant: Secondary | ICD-10-CM

## 2016-07-23 ENCOUNTER — Ambulatory Visit
Admission: RE | Admit: 2016-07-23 | Discharge: 2016-07-23 | Disposition: A | Discharge status: Home / Self Care | Payer: BLUE CROSS/BLUE SHIELD | Source: Ambulatory Visit | Attending: Obstetrics & Gynecology | Admitting: Obstetrics & Gynecology

## 2016-07-23 ENCOUNTER — Other Ambulatory Visit: Payer: Self-pay | Admitting: Obstetrics & Gynecology

## 2016-07-23 DIAGNOSIS — N6310 Unspecified lump in the right breast, unspecified quadrant: Secondary | ICD-10-CM

## 2016-07-27 ENCOUNTER — Ambulatory Visit: Payer: Self-pay | Admitting: Surgery

## 2016-07-27 DIAGNOSIS — N6310 Unspecified lump in the right breast, unspecified quadrant: Secondary | ICD-10-CM

## 2016-07-27 NOTE — H&P (Signed)
Gina Moreno 07/27/2016 1:44 PM Location[facility] Patient #: [id number] DOB: [birth year] Married / Language: English / Race: White Female  History of Present Illness (Chelsea A. Kae Heller MD; 07/27/2016 3:28 PM) Patient words: This is an otherwise healthy 30 year old woman who presents with a right breast mass. 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 Over the last year the mass has increased in size. 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 She denies any pain. Denies any skin changes. Denies any nipple discharge. No swollen lymph nodes in the neck or axilla. She has not appreciated any other masses. She is not a smoker. Does not use any exogenous hormones. No family history of breast cancer. First menarche at age 59. First live birth at age 89.  The patient is a 30 year old female.   Allergies Bary Castilla [place], [place]; 07/27/2016 1:44 PM) Doxycycline Hyclate *Tetracyclines** Phenergan *ANTIHISTAMINES* Imitrex *MIGRAINE PRODUCTS*  Medication History Nance Pear, CMA; 07/27/2016 1:46 PM) Aspirin-Acetaminophen-Caffeine (250-250-65MG  Tablet, Oral as needed) Active. Linzess (72MCG Capsule, Oral as needed) Active. Medications Reconciled    Vitals (Sade Bradford CMA; 07/27/2016 1:46 PM) 07/27/2016 1:46 PM Weight: 175.6 lb Height: 67in Body Surface Area: 1.91 m Body Mass Index: 27.5 kg/m  Temp.: 98.46F  Pulse: 100 (Regular)  BP: 122/78 (Sitting, Left Arm, Standard)      Physical Exam (Chelsea A. Kae Heller MD; 07/27/2016 3:29 PM)  General Mental Status-Alert. General Appearance-Consistent with stated age. Hydration-Well hydrated. Voice-Normal.  Head and  Neck Head-normocephalic, atraumatic with no lesions or palpable masses. Trachea-midline. Thyroid Gland Characteristics - normal size and consistency.  Eye Eyeball - Bilateral-Extraocular movements intact. Sclera/Conjunctiva - Bilateral-No scleral icterus.  Chest and Lung Exam Chest and lung exam reveals -quiet, even and easy respiratory effort with no use of accessory muscles and on auscultation, normal breath sounds, no adventitious sounds and normal vocal resonance. Inspection Chest Wall - Normal. Back - normal.  Breast Breast - Left -Note:Normal, no skin changes, no masses.  Breast - Right -Note:Biopsy site in right breast lateral to the areola. Firmness just superior to this consistent with mass versus hematoma. Nontender and mobile. No overlying skin changes and no axillary adenopathy.   Cardiovascular Cardiovascular examination reveals -normal heart sounds, regular rate and rhythm with no murmurs and normal pedal pulses bilaterally.  Abdomen Inspection Inspection of the abdomen reveals - No Hernias. Palpation/Percussion Palpation and Percussion of the abdomen reveal - Soft, Non Tender, No Rebound tenderness, No Rigidity (guarding) and No hepatosplenomegaly. Auscultation Auscultation of the abdomen reveals - Bowel sounds normal.  Neurologic Neurologic evaluation reveals -alert and oriented x 3 with no impairment of recent or remote memory. Mental Status-Normal.  Musculoskeletal Global Assessment -Note:no gross deformities.  Normal Exam - Left-Upper Extremity Strength Normal and Lower Extremity Strength Normal. Normal Exam - Right-Upper Extremity Strength Normal and Lower Extremity Strength Normal.  Lymphatic Head & Neck  General Head & Neck Lymphatics: Bilateral - Description - Normal. Axillary  General Axillary Region: Bilateral - Description - Normal. Tenderness - Non Tender.    Assessment & Plan (Chelsea A. 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 Imaging describes a lesion at 9:00, the palpable abnormality is more cephalad than this. We'll plan for a right breast needle  localized lumpectomy. Of note the patient states she is now 6 days later for her menstrual cycle. She will take a pregnancy test today and let us know. If she is pregnant we will either need to do this under local or delayed procedure until second trimester.

## 2016-07-30 LAB — HM PAP SMEAR

## 2016-08-20 NOTE — L&D Delivery Note (Signed)
Operative Delivery Note At 4:15 PM a viable female was delivered via Vaginal, Vacuum Neurosurgeon).  Presentation: vertex; Position: Right,, Occiput,, Anterior; Station: +3.  Verbal consent: obtained from patient.  Risks and benefits discussed in detail.  Risks include, but are not limited to the risks of anesthesia, bleeding, infection, damage to maternal tissues, fetal cephalhematoma.  There is also the risk of inability to effect vaginal delivery of the head, or shoulder dystocia that cannot be resolved by established maneuvers, leading to the need for emergency cesarean section.  APGAR: 8, 9; weight 8 lb 0.9 oz (3655 g).   Placenta status: spontaneously with 3 vessel cord, .   Cord:  with the following complications:double nuchal cord x 1 .  Cord pH: not obtained  Anesthesia:  epidural Instruments: mushroom Episiotomy: None Lacerations: 2nd degree;Perineal Suture Repair: 3.0 chromic Est. Blood Loss (mL): 300  Mom to postpartum.  Baby to Couplet care / Skin to Skin.  Melicia Esqueda L 03/27/2017, 7:45 PM

## 2016-08-23 LAB — OB RESULTS CONSOLE GC/CHLAMYDIA
Chlamydia: NEGATIVE
Gonorrhea: NEGATIVE

## 2016-08-23 LAB — OB RESULTS CONSOLE HIV ANTIBODY (ROUTINE TESTING): HIV: NONREACTIVE

## 2016-08-23 LAB — OB RESULTS CONSOLE ABO/RH: RH TYPE: NEGATIVE

## 2016-08-23 LAB — OB RESULTS CONSOLE RPR: RPR: NONREACTIVE

## 2016-08-23 LAB — OB RESULTS CONSOLE ANTIBODY SCREEN: ANTIBODY SCREEN: NEGATIVE

## 2016-08-23 LAB — OB RESULTS CONSOLE HEPATITIS B SURFACE ANTIGEN: Hepatitis B Surface Ag: NEGATIVE

## 2016-08-23 LAB — OB RESULTS CONSOLE RUBELLA ANTIBODY, IGM: Rubella: IMMUNE

## 2016-12-25 ENCOUNTER — Encounter (HOSPITAL_COMMUNITY): Payer: Self-pay | Admitting: *Deleted

## 2016-12-25 ENCOUNTER — Inpatient Hospital Stay (HOSPITAL_COMMUNITY)
Admission: AD | Admit: 2016-12-25 | Discharge: 2016-12-25 | Disposition: A | Discharge status: Home / Self Care | Payer: BLUE CROSS/BLUE SHIELD | Source: Ambulatory Visit | Attending: Obstetrics and Gynecology | Admitting: Obstetrics and Gynecology

## 2016-12-25 DIAGNOSIS — K219 Gastro-esophageal reflux disease without esophagitis: Secondary | ICD-10-CM | POA: Insufficient documentation

## 2016-12-25 DIAGNOSIS — E876 Hypokalemia: Secondary | ICD-10-CM | POA: Insufficient documentation

## 2016-12-25 DIAGNOSIS — G43909 Migraine, unspecified, not intractable, without status migrainosus: Secondary | ICD-10-CM | POA: Insufficient documentation

## 2016-12-25 DIAGNOSIS — K2970 Gastritis, unspecified, without bleeding: Secondary | ICD-10-CM | POA: Insufficient documentation

## 2016-12-25 DIAGNOSIS — O219 Vomiting of pregnancy, unspecified: Secondary | ICD-10-CM

## 2016-12-25 DIAGNOSIS — K2900 Acute gastritis without bleeding: Secondary | ICD-10-CM

## 2016-12-25 DIAGNOSIS — Z9049 Acquired absence of other specified parts of digestive tract: Secondary | ICD-10-CM | POA: Insufficient documentation

## 2016-12-25 DIAGNOSIS — F329 Major depressive disorder, single episode, unspecified: Secondary | ICD-10-CM | POA: Insufficient documentation

## 2016-12-25 DIAGNOSIS — Z3A26 26 weeks gestation of pregnancy: Secondary | ICD-10-CM | POA: Insufficient documentation

## 2016-12-25 DIAGNOSIS — F419 Anxiety disorder, unspecified: Secondary | ICD-10-CM | POA: Insufficient documentation

## 2016-12-25 DIAGNOSIS — O99342 Other mental disorders complicating pregnancy, second trimester: Secondary | ICD-10-CM | POA: Insufficient documentation

## 2016-12-25 DIAGNOSIS — O99612 Diseases of the digestive system complicating pregnancy, second trimester: Secondary | ICD-10-CM | POA: Insufficient documentation

## 2016-12-25 DIAGNOSIS — E785 Hyperlipidemia, unspecified: Secondary | ICD-10-CM | POA: Insufficient documentation

## 2016-12-25 DIAGNOSIS — G2581 Restless legs syndrome: Secondary | ICD-10-CM | POA: Insufficient documentation

## 2016-12-25 DIAGNOSIS — K589 Irritable bowel syndrome without diarrhea: Secondary | ICD-10-CM | POA: Insufficient documentation

## 2016-12-25 LAB — URINALYSIS, ROUTINE W REFLEX MICROSCOPIC
Bilirubin Urine: NEGATIVE
Glucose, UA: NEGATIVE mg/dL
Hgb urine dipstick: NEGATIVE
Ketones, ur: 80 mg/dL — AB
Nitrite: NEGATIVE
Protein, ur: 100 mg/dL — AB
Specific Gravity, Urine: 1.031 — ABNORMAL HIGH (ref 1.005–1.030)
pH: 5 (ref 5.0–8.0)

## 2016-12-25 LAB — COMPREHENSIVE METABOLIC PANEL WITH GFR
ALT: 40 U/L (ref 14–54)
AST: 29 U/L (ref 15–41)
Albumin: 3.3 g/dL — ABNORMAL LOW (ref 3.5–5.0)
Alkaline Phosphatase: 117 U/L (ref 38–126)
Anion gap: 11 (ref 5–15)
BUN: 9 mg/dL (ref 6–20)
CO2: 21 mmol/L — ABNORMAL LOW (ref 22–32)
Calcium: 8.2 mg/dL — ABNORMAL LOW (ref 8.9–10.3)
Chloride: 98 mmol/L — ABNORMAL LOW (ref 101–111)
Creatinine, Ser: 0.49 mg/dL (ref 0.44–1.00)
GFR calc Af Amer: >60 mL/min (ref >60)
GFR calc non Af Amer: >60 mL/min (ref >60)
Glucose, Bld: 87 mg/dL (ref 65–99)
Potassium: 2.8 mmol/L — ABNORMAL LOW (ref 3.5–5.1)
Sodium: 130 mmol/L — ABNORMAL LOW (ref 135–145)
Total Bilirubin: 1 mg/dL (ref 0.3–1.2)
Total Protein: 7.3 g/dL (ref 6.5–8.1)

## 2016-12-25 LAB — CBC
HCT: 36.6 % (ref 36.0–46.0)
Hemoglobin: 12.6 g/dL (ref 12.0–15.0)
MCH: 31.7 pg (ref 26.0–34.0)
MCHC: 34.4 g/dL (ref 30.0–36.0)
MCV: 92.2 fL (ref 78.0–100.0)
Platelets: 230 K/uL (ref 150–400)
RBC: 3.97 MIL/uL (ref 3.87–5.11)
RDW: 14.4 % (ref 11.5–15.5)
WBC: 8.9 K/uL (ref 4.0–10.5)

## 2016-12-25 MED ORDER — POTASSIUM CHLORIDE ER 20 MEQ PO TBCR: 10.0000 meq | EXTENDED_RELEASE_TABLET | Freq: Every day | ORAL | 0 refills | Status: DC

## 2016-12-25 MED ORDER — ONDANSETRON HCL 4 MG/2ML IJ SOLN
4.0000 mg | Freq: Once | INTRAMUSCULAR | Status: AC
  Administered 2016-12-25: 4 mg via INTRAVENOUS
  Filled 2016-12-25: qty 2

## 2016-12-25 MED ORDER — LACTATED RINGERS IV BOLUS (SEPSIS)
1000.0000 mL | Freq: Once | INTRAVENOUS | Status: AC
  Administered 2016-12-25: 1000 mL via INTRAVENOUS

## 2016-12-25 MED ORDER — FAMOTIDINE IN NACL 20-0.9 MG/50ML-% IV SOLN
20.0000 mg | Freq: Once | INTRAVENOUS | Status: AC
  Administered 2016-12-25: 20 mg via INTRAVENOUS
  Filled 2016-12-25: qty 50

## 2016-12-25 MED ORDER — M.V.I. ADULT IV INJ
Freq: Once | INTRAVENOUS | Status: AC
  Administered 2016-12-25: 15:00:00 via INTRAVENOUS
  Filled 2016-12-25: qty 10

## 2016-12-25 NOTE — MAU Note (Signed)
Urine sent to lab 

## 2016-12-25 NOTE — Discharge Instructions (Signed)

## 2016-12-25 NOTE — MAU Provider Note (Signed)
History     CSN: [id number]  Arrival date and time: 12/25/16 1200   First Provider Initiated Contact with Patient 12/25/16 1328      Chief Complaint  Patient presents with  . nausea/vomiting/diarrhea   Ms. Gina Moreno is a 31 yo G2P1001 at 26.[redacted] wks gestation by ultrasound presenting with complaints of N/V/D since Saturday.  She is unable to hold anything down for the past 4 days.  The last time she vomited was last night. She has had diarrhea today.  She reports that she has had reflux throughout pregnancy, but it has been worse since vomiting began.  SHe called the office to notify them what was going on and she was told to come to MAU, if no improvement.  She reports good (+) FM. She receives her prenatal care at [facility] (Dr. Lynnette Caffey).   Emesis   This is a recurrent problem. The current episode started in the past 7 days. The problem occurs 5 to 10 times per day. The problem has been unchanged. The emesis has an appearance of bile. There has been no fever. Associated symptoms include abdominal pain and diarrhea. Pertinent negatives include no fever. 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 She has tried diet change and bed rest for the symptoms. The treatment provided no relief.  Diarrhea   This is a recurrent problem. The current episode started in the past 7 days. The problem occurs 2 to 4 times per day. The problem has been unchanged. The stool consistency is described as watery. The patient states that diarrhea does not awaken her from sleep. Associated symptoms include abdominal pain and vomiting. Pertinent negatives include no fever. Risk factors include ill contacts. She has tried change of diet and increased fluids for the symptoms. The treatment provided no relief.     Past Medical History:  Diagnosis Date  . Anemia 07/25/2013  . Anxiety   . Anxiety and depression 07/25/2013  . Depression   . Esophageal reflux 07/25/2013  . GERD (gastroesophageal  reflux disease)   . Heart murmur    at birth   . Hx of varicella   . IBS (irritable bowel syndrome)   . Migraine 03/22/2015  . Other and unspecified hyperlipidemia 07/25/2013  . Preventative health care 07/25/2013  . RLS (restless legs syndrome) 05/20/2014  . Tachycardia 09/16/2014    Past Surgical History:  Procedure Laterality Date  . CHOLECYSTECTOMY N/A 10/25/2015   Procedure: LAPAROSCOPIC CHOLECYSTECTOMY;  Surgeon: Greer Pickerel, MD;  Location: [facility];  Service: General;  Laterality: N/A;  . WISDOM TOOTH EXTRACTION  31 yrs old    Family History  Problem Relation Age of Onset  . Hyperlipidemia Father   . Arthritis Paternal Grandmother   . Cancer Paternal Grandmother     cervical  . Diabetes Paternal Grandmother   . Depression Paternal Grandmother   . Cancer Paternal Grandfather     lung stomach  . Thyroid disease Mother     Social History  Substance Use Topics  . Smoking status: Never Smoker  . Smokeless tobacco: Never Used  . Alcohol use No    Allergies:  Allergies  Allergen Reactions  . Imitrex [Sumatriptan]     Syncope  . Norvasc [Amlodipine Besylate] Other (See Comments)    Pt can't remember what reaction was to this drug  . Phenergan [Promethazine Hcl]     Uncontrollable arm movement.   . Doxycycline Rash    Prescriptions Prior to Admission  Medication Sig Dispense  Refill Last Dose  . calcium carbonate (TUMS - DOSED IN MG ELEMENTAL CALCIUM) 500 MG chewable tablet Chew 1 tablet by mouth 2 (two) times daily as needed for indigestion or heartburn.    12/24/2016 at Unknown time  . Prenatal Vit-Fe Fumarate-FA (PRENATAL MULTIVITAMIN) TABS tablet Take 1 tablet by mouth daily at 12 noon.   12/24/2016 at Unknown time    Review of Systems  Constitutional: Positive for appetite change and fatigue. Negative for fever.  Eyes: Negative.   Respiratory: Negative.   Cardiovascular: Negative.   Gastrointestinal: Positive for abdominal pain, diarrhea, nausea and vomiting.        Increased reflux  Endocrine: Negative.   Genitourinary: Negative.   Musculoskeletal: Negative.   Skin: Negative.   Allergic/Immunologic: Negative.   Neurological: Negative.   Hematological: Negative.   Psychiatric/Behavioral: Negative.    Physical Exam   Blood pressure 129/82, pulse 83, temperature 97.8 F (36.6 C), resp. rate 18, height 5\' 6"  (1.676 m), weight 78.5 kg (173 lb), last menstrual period 05/28/2016, SpO2 100 %.  Physical Exam  Constitutional: She is oriented to person, place, and time. She appears well-developed and well-nourished.  HENT:  Head: Normocephalic.  Eyes: Pupils are equal, round, and reactive to light.  Neck: Normal range of motion.  Cardiovascular: Normal rate, regular rhythm, normal heart sounds and intact distal pulses.   Respiratory: Effort normal and breath sounds normal.  GI: Soft. Bowel sounds are normal.  Genitourinary:  Genitourinary Comments: Gravid, S=D, cx: deferred  Musculoskeletal: Normal range of motion.  Neurological: She is alert and oriented to person, place, and time. She has normal reflexes.  Skin: Skin is warm.  Psychiatric: She has a normal mood and affect. Her behavior is normal. Judgment and thought content normal.   NST  FHR: 135 bpm / moderate variability / accels present / decels absent TOCO: UI noted  Results for orders placed or performed during the hospital encounter of 12/25/16 (from the past 24 hour(s))  Urinalysis, Routine w reflex microscopic     Status: Abnormal   Collection Time: 12/25/16 12:12 PM  Result Value Ref Range   Color, Urine Kimia (A) YELLOW   APPearance CLOUDY (A) CLEAR   Specific Gravity, Urine 1.031 (H) 1.005 - 1.030   pH 5.0 5.0 - 8.0   Glucose, UA NEGATIVE NEGATIVE mg/dL   Hgb urine dipstick NEGATIVE NEGATIVE   Bilirubin Urine NEGATIVE NEGATIVE   Ketones, ur 80 (A) NEGATIVE mg/dL   Protein, ur 100 (A) NEGATIVE mg/dL   Nitrite NEGATIVE NEGATIVE   Leukocytes, UA MODERATE (A) NEGATIVE   RBC /  HPF 6-30 0 - 5 RBC/hpf   WBC, UA TOO NUMEROUS TO COUNT 0 - 5 WBC/hpf   Bacteria, UA MANY (A) NONE SEEN   Squamous Epithelial / LPF 6-30 (A) NONE SEEN   WBC Clumps PRESENT    Mucous PRESENT    Hyaline Casts, UA PRESENT   CBC     Status: None   Collection Time: 12/25/16  1:24 PM  Result Value Ref Range   WBC 8.9 4.0 - 10.5 K/uL   RBC 3.97 3.87 - 5.11 MIL/uL   Hemoglobin 12.6 12.0 - 15.0 g/dL   HCT 36.6 36.0 - 46.0 %   MCV 92.2 78.0 - 100.0 fL   MCH 31.7 26.0 - 34.0 pg   MCHC 34.4 30.0 - 36.0 g/dL   RDW 14.4 11.5 - 15.5 %   Platelets 230 150 - 400 K/uL  Comprehensive metabolic panel  Status: Abnormal   Collection Time: 12/25/16  1:24 PM  Result Value Ref Range   Sodium 130 (L) 135 - 145 mmol/L   Potassium 2.8 (L) 3.5 - 5.1 mmol/L   Chloride 98 (L) 101 - 111 mmol/L   CO2 21 (L) 22 - 32 mmol/L   Glucose, Bld 87 65 - 99 mg/dL   BUN 9 6 - 20 mg/dL   Creatinine, Ser 0.49 0.44 - 1.00 mg/dL   Calcium 8.2 (L) 8.9 - 10.3 mg/dL   Total Protein 7.3 6.5 - 8.1 g/dL   Albumin 3.3 (L) 3.5 - 5.0 g/dL   AST 29 15 - 41 U/L   ALT 40 14 - 54 U/L   Alkaline Phosphatase 117 38 - 126 U/L   Total Bilirubin 1.0 0.3 - 1.2 mg/dL   GFR calc non Af Amer >60 >60 mL/min   GFR calc Af Amer >60 >60 mL/min   Anion gap 11 5 - 15   MAU Course  Procedures  MDM CCUA CBC CMP LR bolus Zofran 4 mg IVPB Pepcid 20 mg IVPB D5LR with MVI at 500 ml/hr / pt feeling better and able to tolerate PO ice chips and crackers NST Consult with Dr. Helane Rima - notified of lab results, pt's status / agree with oral K-dur replacement for hypokalemia, ok to keep appt on 5/21  Assessment and Plan  31 yo G2P1001 at 26.[redacted] wks gestation Gastritis Hypokalemia Second Trimester Pregnancy  Discharge Home Rx for K-Dur 20 mEq po daily x 3 days B.R.A.T. diet Maintain good hydration daily Keep scheduled appointment with [facility] on 01/07/2017 Patient verbalized an understanding of the plan of care and agrees.  Laury Deep MSN,  CNM 12/25/2016, 5:16 PM

## 2016-12-25 NOTE — MAU Note (Signed)
Patient c/o vomiting and diarrhea since Saturday. Unable to hold down anything to eat or drink. Last time she threw up was last night; nauseous all day today.

## 2016-12-25 NOTE — Progress Notes (Signed)
Laury Deep CNM talked with pt regarding d/c plan. Written and verbal d/c instructions given and understanding voiced.

## 2016-12-25 NOTE — Progress Notes (Signed)
OK to d/c efm per Laury Deep CNM

## 2016-12-27 ENCOUNTER — Encounter: Payer: Self-pay | Admitting: Family Medicine

## 2017-01-07 ENCOUNTER — Encounter: Payer: Self-pay | Admitting: Family Medicine

## 2017-01-08 ENCOUNTER — Other Ambulatory Visit: Payer: Self-pay | Admitting: Family Medicine

## 2017-01-08 MED ORDER — RANITIDINE HCL 300 MG PO CAPS: 300.0000 mg | ORAL_CAPSULE | Freq: Every evening | ORAL | 3 refills | Status: DC

## 2017-02-27 LAB — OB RESULTS CONSOLE GBS: STREP GROUP B AG: POSITIVE

## 2017-03-22 ENCOUNTER — Telehealth (HOSPITAL_COMMUNITY): Payer: Self-pay | Admitting: *Deleted

## 2017-03-22 NOTE — Telephone Encounter (Signed)
Preadmission screen  

## 2017-03-25 ENCOUNTER — Encounter (HOSPITAL_COMMUNITY): Payer: Self-pay | Admitting: *Deleted

## 2017-03-25 ENCOUNTER — Telehealth (HOSPITAL_COMMUNITY): Payer: Self-pay | Admitting: *Deleted

## 2017-03-25 NOTE — Telephone Encounter (Signed)
Preadmission screen  

## 2017-03-27 ENCOUNTER — Encounter (HOSPITAL_COMMUNITY): Payer: Self-pay

## 2017-03-27 ENCOUNTER — Inpatient Hospital Stay (HOSPITAL_COMMUNITY)
Admission: AD | Admit: 2017-03-27 | Payer: BLUE CROSS/BLUE SHIELD | Source: Ambulatory Visit | Admitting: Obstetrics and Gynecology

## 2017-03-27 ENCOUNTER — Inpatient Hospital Stay (HOSPITAL_COMMUNITY): Payer: BLUE CROSS/BLUE SHIELD | Admitting: Anesthesiology

## 2017-03-27 ENCOUNTER — Inpatient Hospital Stay (HOSPITAL_COMMUNITY)
Admission: RE | Admit: 2017-03-27 | Discharge: 2017-03-29 | DRG: 775 | Disposition: A | Discharge status: Home / Self Care | Payer: BLUE CROSS/BLUE SHIELD | Source: Ambulatory Visit | Attending: Obstetrics and Gynecology | Admitting: Obstetrics and Gynecology

## 2017-03-27 DIAGNOSIS — O480 Post-term pregnancy: Secondary | ICD-10-CM | POA: Diagnosis present

## 2017-03-27 DIAGNOSIS — O99824 Streptococcus B carrier state complicating childbirth: Secondary | ICD-10-CM | POA: Diagnosis present

## 2017-03-27 DIAGNOSIS — O9962 Diseases of the digestive system complicating childbirth: Secondary | ICD-10-CM | POA: Diagnosis present

## 2017-03-27 DIAGNOSIS — Z3A40 40 weeks gestation of pregnancy: Secondary | ICD-10-CM

## 2017-03-27 DIAGNOSIS — K219 Gastro-esophageal reflux disease without esophagitis: Secondary | ICD-10-CM | POA: Diagnosis present

## 2017-03-27 DIAGNOSIS — O26893 Other specified pregnancy related conditions, third trimester: Secondary | ICD-10-CM | POA: Diagnosis present

## 2017-03-27 LAB — CBC
HCT: 34.5 % — ABNORMAL LOW (ref 36.0–46.0)
Hemoglobin: 12 g/dL (ref 12.0–15.0)
MCH: 32.2 pg (ref 26.0–34.0)
MCHC: 34.8 g/dL (ref 30.0–36.0)
MCV: 92.5 fL (ref 78.0–100.0)
PLATELETS: 181 10*3/uL (ref 150–400)
RBC: 3.73 MIL/uL — ABNORMAL LOW (ref 3.87–5.11)
RDW: 14.3 % (ref 11.5–15.5)
WBC: 12.7 10*3/uL — ABNORMAL HIGH (ref 4.0–10.5)

## 2017-03-27 LAB — TYPE AND SCREEN
ABO/RH(D): O NEG
Antibody Screen: NEGATIVE

## 2017-03-27 MED ORDER — ONDANSETRON HCL 4 MG/2ML IJ SOLN: 4.0000 mg | Freq: Four times a day (QID) | INTRAMUSCULAR | Status: DC | PRN

## 2017-03-27 MED ORDER — DIPHENHYDRAMINE HCL 50 MG/ML IJ SOLN: 12.5000 mg | INTRAMUSCULAR | Status: DC | PRN

## 2017-03-27 MED ORDER — TERBUTALINE SULFATE 1 MG/ML IJ SOLN
0.2500 mg | Freq: Once | INTRAMUSCULAR | Status: DC | PRN
  Filled 2017-03-27: qty 1

## 2017-03-27 MED ORDER — PHENYLEPHRINE 40 MCG/ML (10ML) SYRINGE FOR IV PUSH (FOR BLOOD PRESSURE SUPPORT)
80.0000 ug | PREFILLED_SYRINGE | INTRAVENOUS | Status: DC | PRN
  Filled 2017-03-27: qty 5

## 2017-03-27 MED ORDER — OXYCODONE-ACETAMINOPHEN 5-325 MG PO TABS
1.0000 | ORAL_TABLET | ORAL | Status: DC | PRN
  Filled 2017-03-27: qty 1

## 2017-03-27 MED ORDER — SENNOSIDES-DOCUSATE SODIUM 8.6-50 MG PO TABS
2.0000 | ORAL_TABLET | ORAL | Status: DC
  Administered 2017-03-28 – 2017-03-29 (×2): 2 via ORAL
  Filled 2017-03-27 (×2): qty 2

## 2017-03-27 MED ORDER — OXYTOCIN BOLUS FROM INFUSION
500.0000 mL | Freq: Once | INTRAVENOUS | Status: AC
  Administered 2017-03-27: 500 mL via INTRAVENOUS

## 2017-03-27 MED ORDER — ONDANSETRON HCL 4 MG PO TABS: 4.0000 mg | ORAL_TABLET | ORAL | Status: DC | PRN

## 2017-03-27 MED ORDER — IBUPROFEN 600 MG PO TABS
600.0000 mg | ORAL_TABLET | Freq: Four times a day (QID) | ORAL | Status: DC
  Administered 2017-03-27 – 2017-03-29 (×7): 600 mg via ORAL
  Filled 2017-03-27 (×8): qty 1

## 2017-03-27 MED ORDER — SOD CITRATE-CITRIC ACID 500-334 MG/5ML PO SOLN
30.0000 mL | ORAL | Status: DC | PRN
  Administered 2017-03-28: 30 mL via ORAL
  Filled 2017-03-27: qty 15

## 2017-03-27 MED ORDER — ERYTHROMYCIN 5 MG/GM OP OINT
TOPICAL_OINTMENT | OPHTHALMIC | Status: AC
  Filled 2017-03-27: qty 1

## 2017-03-27 MED ORDER — DIPHENHYDRAMINE HCL 25 MG PO CAPS: 25.0000 mg | ORAL_CAPSULE | Freq: Four times a day (QID) | ORAL | Status: DC | PRN

## 2017-03-27 MED ORDER — TETANUS-DIPHTH-ACELL PERTUSSIS 5-2.5-18.5 LF-MCG/0.5 IM SUSP: 0.5000 mL | Freq: Once | INTRAMUSCULAR | Status: DC

## 2017-03-27 MED ORDER — BENZOCAINE-MENTHOL 20-0.5 % EX AERO
1.0000 "application " | INHALATION_SPRAY | CUTANEOUS | Status: DC | PRN
  Administered 2017-03-27 – 2017-03-28 (×2): 1 via TOPICAL
  Filled 2017-03-27 (×2): qty 56

## 2017-03-27 MED ORDER — PRENATAL MULTIVITAMIN CH
1.0000 | ORAL_TABLET | Freq: Every day | ORAL | Status: DC
  Administered 2017-03-28: 1 via ORAL
  Filled 2017-03-27: qty 1

## 2017-03-27 MED ORDER — PHENYLEPHRINE 40 MCG/ML (10ML) SYRINGE FOR IV PUSH (FOR BLOOD PRESSURE SUPPORT)
80.0000 ug | PREFILLED_SYRINGE | INTRAVENOUS | Status: DC | PRN
  Filled 2017-03-27: qty 10
  Filled 2017-03-27: qty 5

## 2017-03-27 MED ORDER — MEASLES, MUMPS & RUBELLA VAC ~~LOC~~ INJ
0.5000 mL | INJECTION | Freq: Once | SUBCUTANEOUS | Status: DC
  Filled 2017-03-27: qty 0.5

## 2017-03-27 MED ORDER — PENICILLIN G POTASSIUM 5000000 UNITS IJ SOLR
5.0000 10*6.[IU] | Freq: Once | INTRAVENOUS | Status: AC
  Administered 2017-03-27: 5 10*6.[IU] via INTRAVENOUS
  Filled 2017-03-27: qty 5

## 2017-03-27 MED ORDER — ACETAMINOPHEN 325 MG PO TABS
650.0000 mg | ORAL_TABLET | ORAL | Status: DC | PRN
Start: 2017-03-27 — End: 2017-03-28

## 2017-03-27 MED ORDER — FENTANYL 2.5 MCG/ML BUPIVACAINE 1/10 % EPIDURAL INFUSION (WH - ANES): 14.0000 mL/h | INTRAMUSCULAR | Status: DC | PRN

## 2017-03-27 MED ORDER — LACTATED RINGERS IV SOLN: 500.0000 mL | Freq: Once | INTRAVENOUS | Status: DC

## 2017-03-27 MED ORDER — WITCH HAZEL-GLYCERIN EX PADS
1.0000 "application " | MEDICATED_PAD | CUTANEOUS | Status: DC | PRN
  Administered 2017-03-27: 1 via TOPICAL

## 2017-03-27 MED ORDER — FLEET ENEMA 7-19 GM/118ML RE ENEM: 1.0000 | ENEMA | Freq: Every day | RECTAL | Status: DC | PRN

## 2017-03-27 MED ORDER — LACTATED RINGERS IV SOLN: INTRAVENOUS | Status: DC

## 2017-03-27 MED ORDER — SIMETHICONE 80 MG PO CHEW: 80.0000 mg | CHEWABLE_TABLET | ORAL | Status: DC | PRN

## 2017-03-27 MED ORDER — LIDOCAINE HCL (PF) 1 % IJ SOLN
INTRAMUSCULAR | Status: DC | PRN
  Administered 2017-03-27: 9 mL
  Administered 2017-03-27: 5 mL

## 2017-03-27 MED ORDER — EPHEDRINE 5 MG/ML INJ
10.0000 mg | INTRAVENOUS | Status: DC | PRN
  Filled 2017-03-27: qty 2

## 2017-03-27 MED ORDER — DIBUCAINE 1 % RE OINT
1.0000 "application " | TOPICAL_OINTMENT | RECTAL | Status: DC | PRN
  Administered 2017-03-28: 1 via RECTAL
  Filled 2017-03-27: qty 28

## 2017-03-27 MED ORDER — OXYCODONE-ACETAMINOPHEN 5-325 MG PO TABS: 2.0000 | ORAL_TABLET | ORAL | Status: DC | PRN

## 2017-03-27 MED ORDER — OXYCODONE-ACETAMINOPHEN 5-325 MG PO TABS
1.0000 | ORAL_TABLET | ORAL | Status: DC | PRN
  Administered 2017-03-27 – 2017-03-29 (×6): 1 via ORAL
  Filled 2017-03-27 (×5): qty 1

## 2017-03-27 MED ORDER — PENICILLIN G POT IN DEXTROSE 60000 UNIT/ML IV SOLN
3.0000 10*6.[IU] | INTRAVENOUS | Status: DC
  Administered 2017-03-27: 3 10*6.[IU] via INTRAVENOUS
  Filled 2017-03-27 (×7): qty 50

## 2017-03-27 MED ORDER — COCONUT OIL OIL: 1.0000 "application " | TOPICAL_OIL | Status: DC | PRN

## 2017-03-27 MED ORDER — ZOLPIDEM TARTRATE 5 MG PO TABS: 5.0000 mg | ORAL_TABLET | Freq: Every evening | ORAL | Status: DC | PRN

## 2017-03-27 MED ORDER — FENTANYL 2.5 MCG/ML BUPIVACAINE 1/10 % EPIDURAL INFUSION (WH - ANES)
14.0000 mL/h | INTRAMUSCULAR | Status: DC | PRN
  Administered 2017-03-27: 14 mL/h via EPIDURAL
  Filled 2017-03-27: qty 100

## 2017-03-27 MED ORDER — OXYTOCIN 40 UNITS IN LACTATED RINGERS INFUSION - SIMPLE MED: 2.5000 [IU]/h | INTRAVENOUS | Status: DC

## 2017-03-27 MED ORDER — OXYTOCIN 40 UNITS IN LACTATED RINGERS INFUSION - SIMPLE MED
1.0000 m[IU]/min | INTRAVENOUS | Status: DC
  Administered 2017-03-27: 2 m[IU]/min via INTRAVENOUS
  Filled 2017-03-27: qty 1000

## 2017-03-27 MED ORDER — ACETAMINOPHEN 325 MG PO TABS: 650.0000 mg | ORAL_TABLET | ORAL | Status: DC | PRN

## 2017-03-27 MED ORDER — LIDOCAINE HCL (PF) 1 % IJ SOLN
30.0000 mL | INTRAMUSCULAR | Status: DC | PRN
  Filled 2017-03-27: qty 30

## 2017-03-27 MED ORDER — MEDROXYPROGESTERONE ACETATE 150 MG/ML IM SUSP: 150.0000 mg | INTRAMUSCULAR | Status: DC | PRN

## 2017-03-27 MED ORDER — LACTATED RINGERS IV SOLN: 500.0000 mL | INTRAVENOUS | Status: DC | PRN

## 2017-03-27 MED ORDER — LACTATED RINGERS IV SOLN
500.0000 mL | Freq: Once | INTRAVENOUS | Status: AC
  Administered 2017-03-27: 500 mL via INTRAVENOUS

## 2017-03-27 MED ORDER — ONDANSETRON HCL 4 MG/2ML IJ SOLN: 4.0000 mg | INTRAMUSCULAR | Status: DC | PRN

## 2017-03-27 MED ORDER — BISACODYL 10 MG RE SUPP: 10.0000 mg | Freq: Every day | RECTAL | Status: DC | PRN

## 2017-03-27 NOTE — H&P (Signed)
Gina Moreno is a 31 y.o.G 2 P 0 at 40 w 2 days presents for induction of labor  OB History    Gravida Para Term Preterm AB Living   2 1 1     1    SAB TAB Ectopic Multiple Live Births           1     Past Medical History:  Diagnosis Date  . Anemia 07/25/2013  . Anxiety   . Anxiety and depression 07/25/2013  . Depression   . Esophageal reflux 07/25/2013  . GERD (gastroesophageal reflux disease)   . Heart murmur    at birth   . History of benign breast tumor   . Hx of varicella   . IBS (irritable bowel syndrome)   . Migraine 03/22/2015  . Other and unspecified hyperlipidemia 07/25/2013  . Preventative health care 07/25/2013  . RLS (restless legs syndrome) 05/20/2014  . Tachycardia 09/16/2014   Past Surgical History:  Procedure Laterality Date  . CHOLECYSTECTOMY N/A 10/25/2015   Procedure: LAPAROSCOPIC CHOLECYSTECTOMY;  Surgeon: Greer Pickerel, MD;  Location: [facility];  Service: General;  Laterality: N/A;  . WISDOM TOOTH EXTRACTION  31 yrs old   Family History: family history includes Arthritis in her paternal grandmother; Cancer in her paternal grandfather and paternal grandmother; Depression in her paternal grandmother; Diabetes in her paternal grandmother; Hyperlipidemia in her father; Thyroid disease in her mother. Social History:  reports that she has never smoked. She has never used smokeless tobacco. She reports that she does not drink alcohol or use drugs.     Maternal Diabetes: No Genetic Screening: Normal Maternal Ultrasounds/Referrals: Normal Fetal Ultrasounds or other Referrals:  None Maternal Substance Abuse:  No Significant Maternal Medications:  None Significant Maternal Lab Results:  None Other Comments:  None  Review of Systems  All other systems reviewed and are negative.  History Exam by:: (P) Dr. Helane Rima Temperature 98.2 F (36.8 C), temperature source Oral, resp. rate 18, height 5\' 6"  (1.676 m), weight 85.3 kg (188 lb), last menstrual period  05/28/2016. Maternal Exam:  Abdomen: Fetal presentation: vertex     Fetal Exam Fetal State Assessment: Category I - tracings are normal.     Physical Exam  Nursing note and vitals reviewed. Constitutional: She appears well-developed and well-nourished.  HENT:  Head: Normocephalic and atraumatic.    Prenatal labs: ABO, Rh: O/Negative/-- (01/04 0000) Antibody: Negative (01/04 0000) Rubella: Immune (01/04 0000) RPR: Nonreactive (01/04 0000)  HBsAg: Negative (01/04 0000)  HIV: Non-reactive (01/04 0000)  GBS: Positive (07/11 0000)   Assessment/Plan: IUP at5 39 w 2 days IOL Pitocin  Epidural AROM after epidural  Zackeriah Kissler L 03/27/2017, 9:22 AM

## 2017-03-27 NOTE — Progress Notes (Signed)
Patient unable to tolerate SVE.  MD unable to determine cervical exam.  Pt was noted to be 2 cms at the office.  Bedside US confirms vertex presentation.

## 2017-03-27 NOTE — Anesthesia Preprocedure Evaluation (Signed)
Anesthesia Evaluation  Patient identified by MRN, date of birth, ID band Patient awake    Reviewed: Allergy & Precautions, NPO status , Patient's Chart, lab work & pertinent test results  Airway Mallampati: II  TM Distance: >3 FB Neck ROM: Full    Dental no notable dental hx. (+) Dental Advisory Given   Pulmonary neg pulmonary ROS,    Pulmonary exam normal breath sounds clear to auscultation       Cardiovascular negative cardio ROS Normal cardiovascular exam Rhythm:Regular Rate:Normal     Neuro/Psych  Headaches, Anxiety Depression RLS    GI/Hepatic Neg liver ROS, GERD  ,IBS   Endo/Other  negative endocrine ROS  Renal/GU negative Renal ROS  negative genitourinary   Musculoskeletal negative musculoskeletal ROS (+)   Abdominal   Peds negative pediatric ROS (+)  Hematology negative hematology ROS (+)   Anesthesia Other Findings   Reproductive/Obstetrics negative OB ROS                             Anesthesia Physical Anesthesia Plan  ASA: II  Anesthesia Plan: Epidural   Post-op Pain Management:    Induction:   PONV Risk Score and Plan:   Airway Management Planned: Natural Airway  Additional Equipment:   Intra-op Plan:   Post-operative Plan:   Informed Consent: I have reviewed the patients History and Physical, chart, labs and discussed the procedure including the risks, benefits and alternatives for the proposed anesthesia with the patient or authorized representative who has indicated his/her understanding and acceptance.     Plan Discussed with:   Anesthesia Plan Comments: (Labs reviewed. Platelets acceptable, patient not taking any blood thinning medications. Risks and benefits discussed with patient, patient expressed understanding and wished to proceed.)        Anesthesia Quick Evaluation

## 2017-03-27 NOTE — Anesthesia Pain Management Evaluation Note (Signed)
  CRNA Pain Management Visit Note  Patient: Gina Moreno, 31 y.o., female  "Hello I am a member of the anesthesia team at [facility]. We have an anesthesia team available at all times to provide care throughout the hospital, including epidural management and anesthesia for C-section. I don't know your plan for the delivery whether it a natural birth, water birth, IV sedation, nitrous supplementation, doula or epidural, but we want to meet your pain goals."   1.Was your pain managed to your expectations on prior hospitalizations?   Yes   2.What is your expectation for pain management during this hospitalization?     Epidural  3.How can we help you reach that goal? Maintain epidural  Record the patient's initial score and the patient's pain goal.   Pain: 8, now a 3  Pain Goal: 3 [facility] wants you to be able to say your pain was always managed very well.  Gina Moreno 03/27/2017

## 2017-03-27 NOTE — Lactation Note (Signed)
This note was copied from a baby's chart. Lactation Consultation Note  Patient Name: Gina Moreno Today's Date: 03/27/2017 Reason for consult: Initial assessment   Initial consult at <1 hr after birth.  GA 40.0.  Mom is a P2 with 1 month breastfeeding experience with previous child. Mom stated she has a benign breast tumor on right breast.  Upon palpation mass was about a 1/2 dollar in size and is located on lateral side of left breast.   Mom had infant at breast feeding in cradle hold when [facility] entered room.  Infant would take a few sucks and then come off crying but would go back on breast. LC taught mom how to sandwich breast to assist infant with depth when latching or re-latching.  Taught dad how to do chin tug to assure wide mouth and flanged lips.   Infant started staying on breast longer between crying episodes.  Swallows heard.  LS-8 by [facility] Educated on supply and demand and encouraged mom to exclusively breastfeed to build a good supply.  Mom supplemented with formula with first child.   Educated to feed with feeding cues at least 8-12 times per day, size of infant's stomach, and cluster feeding. Lactation brochure given and informed of hospital support group and OP services.        Maternal Data Formula Feeding for Exclusion: No Does the patient have breastfeeding experience prior to this delivery?: Yes  Feeding Feeding Type: Breast Fed  LATCH Score Latch: Grasps breast easily, tongue down, lips flanged, rhythmical sucking.  Audible Swallowing: A few with stimulation  Type of Nipple: Everted at rest and after stimulation  Comfort (Breast/Nipple): Soft / non-tender  Hold (Positioning): Assistance needed to correctly position infant at breast and maintain latch. (minimal assistance given with sandwiching breast)  LATCH Score: 8  Interventions Interventions: Breast feeding basics reviewed;Skin to skin;Breast compression  Lactation Tools Discussed/Used WIC Program:  No   Consult Status Consult Status: Follow-up Date: 03/28/17 Follow-up type: In-patient    Merlene Laughter 03/27/2017, 5:45 PM

## 2017-03-27 NOTE — Anesthesia Procedure Notes (Signed)
Epidural Patient location during procedure: OB  Staffing Anesthesiologist: Renold Don E Performed: anesthesiologist   Preanesthetic Checklist Completed: patient identified, pre-op evaluation, timeout performed, IV checked, risks and benefits discussed and monitors and equipment checked  Epidural Patient position: sitting Prep: DuraPrep Patient monitoring: continuous pulse ox and blood pressure Approach: midline Location: L3-L4 Injection technique: LOR saline  Needle:  Needle type: Tuohy  Needle gauge: 17 G Needle length: 9 cm Needle insertion depth: 9 cm Catheter size: 19 Gauge Catheter at skin depth: 14 cm Test dose: negative and Other (1% lidocaine)  Additional Notes Patient identified. Risks including, but not limited to, bleeding, infection, nerve damage, paralysis, inadequate analgesia, blood pressure changes, nausea, vomiting, allergic reaction, postpartum back pain, itching, and headache were discussed. Patient expressed understanding and wished to proceed. Sterile prep and drape, including hand hygiene, mask, and sterile gloves were used. The patient was positioned and the spine was prepped. The skin was anesthetized with lidocaine. No paraesthesia or other complication noted. The patient did not experience any signs of intravascular injection such as tinnitus or metallic taste in mouth, nor signs of intrathecal spread such as rapid motor block. Please see nursing notes for vital signs. The patient tolerated the procedure well.   Renold Don, MDReason for block:procedure for pain

## 2017-03-28 LAB — CBC
HEMATOCRIT: 29.3 % — AB (ref 36.0–46.0)
Hemoglobin: 10.5 g/dL — ABNORMAL LOW (ref 12.0–15.0)
MCH: 33 pg (ref 26.0–34.0)
MCHC: 35.8 g/dL (ref 30.0–36.0)
MCV: 92.1 fL (ref 78.0–100.0)
PLATELETS: 184 10*3/uL (ref 150–400)
RBC: 3.18 MIL/uL — ABNORMAL LOW (ref 3.87–5.11)
RDW: 14.3 % (ref 11.5–15.5)
WBC: 15.8 10*3/uL — AB (ref 4.0–10.5)

## 2017-03-28 LAB — RPR: RPR Ser Ql: NONREACTIVE

## 2017-03-28 LAB — RUBELLA SCREEN: RUBELLA: 2.82 {index} (ref >0.99)

## 2017-03-28 MED ORDER — CALCIUM CARBONATE ANTACID 500 MG PO CHEW
1.0000 | CHEWABLE_TABLET | Freq: Two times a day (BID) | ORAL | Status: DC
  Administered 2017-03-28 (×2): 200 mg via ORAL
  Filled 2017-03-28 (×2): qty 1

## 2017-03-28 MED ORDER — FAMOTIDINE 20 MG PO TABS
20.0000 mg | ORAL_TABLET | Freq: Two times a day (BID) | ORAL | Status: DC
  Administered 2017-03-28: 20 mg via ORAL
  Filled 2017-03-28: qty 1

## 2017-03-28 MED ORDER — RHO D IMMUNE GLOBULIN 1500 UNIT/2ML IJ SOSY
300.0000 ug | PREFILLED_SYRINGE | Freq: Once | INTRAMUSCULAR | Status: AC
  Administered 2017-03-28: 300 ug via INTRAVENOUS
  Filled 2017-03-28: qty 2

## 2017-03-28 NOTE — Lactation Note (Signed)
This note was copied from a baby's chart. Lactation Consultation Note  Patient Name: Gina Moreno [id number] Date: 03/28/2017 Reason for consult: Follow-up assessment Baby at 25 hr of life. Upon entry baby was coming off the breast. Mom denies breast or nipple pain, voiced no concerns. Answered questions about DEBP. Discussed baby behavior, feeding frequency, baby belly size, voids, wt loss, breast changes, and nipple care. Demonstrated manual expression, colostrum noted bilaterally, spoon in room. Mom is aware of lactation services and support group. She will call as needed.     Maternal Data Has patient been taught Hand Expression?: Yes  Feeding Feeding Type: Breast Fed Length of feed: 10 min  LATCH Score                   Interventions    Lactation Tools Discussed/Used WIC Program: No   Consult Status Consult Status: Follow-up Date: 03/29/17 Follow-up type: In-patient    Denzil Hughes 03/28/2017, 5:23 PM

## 2017-03-28 NOTE — Progress Notes (Signed)
CSW received consult for hx of Anxiety and Depression.  CSW met with MOB, FOB and MGM to offer support and complete assessment.   MOB welcomed CSW's visit and provided consent to speak openly with her husband and mother in the room.  MOB presented as bright and cheerful, and smiled while she spoke about baby.  She reports that this labor and delivery was much better than her first and that she feels that she succeeded in her goal of a quicker delivery, "15 minutes of pushing vs 2 hours."  She reports she and baby are doing well.  She identifies no symptoms of PMADs after her first delivery and states she experienced anxiety and depression, for which she took medication and received counseling three years ago.  She states no current symptoms or need for treatment at this time.    CSW provided education regarding the baby blues period vs. perinatal mood disorders and encouraged MOB to speak with a medical professional if she has concerns about her emotions at any time.  MOB agreed and states no questions, concerns or needs at this time.  CSW identifies no further need for intervention and no barriers to discharge at this time.

## 2017-03-28 NOTE — Progress Notes (Signed)
Post Partum Day 1 Subjective: no complaints  Objective: Blood pressure 118/65, pulse 79, temperature 98 F (36.7 C), temperature source Oral, resp. rate 18, height 5\' 6"  (1.676 m), weight 188 lb (85.3 kg), last menstrual period 05/28/2016, SpO2 98 %, unknown if currently breastfeeding.  Physical Exam:  General: alert and cooperative Lochia: appropriate Uterine Fundus: firm Incision: n/a DVT Evaluation: No evidence of DVT seen on physical exam.   Recent Labs  03/27/17 0830 03/28/17 0522  HGB 12.0 10.5*  HCT 34.5* 29.3*    Assessment/Plan: Plan for discharge tomorrow  Desires circ.  R/b/a reviewed and informed consent   LOS: 1 day   Gina Moreno 03/28/2017, 8:11 AM

## 2017-03-28 NOTE — Progress Notes (Signed)
Post Partum Day 1 Subjective: no complaints  Objective: Blood pressure 118/65, pulse 79, temperature 98 F (36.7 C), temperature source Oral, resp. rate 18, height 5\' 6"  (1.676 m), weight 188 lb (85.3 kg), last menstrual period 05/28/2016, SpO2 98 %, unknown if currently breastfeeding.  Physical Exam:  General: alert and cooperative Lochia: appropriate Uterine Fundus: firm Incision: healing well, no significant drainage DVT Evaluation: No evidence of DVT seen on physical exam.   Recent Labs  03/27/17 0830 03/28/17 0522  HGB 12.0 10.5*  HCT 34.5* 29.3*    Assessment/Plan: Plan for discharge tomorrow  circ d/w patient, r/b/a reviewed and informed consent   LOS: 1 day   Gina Moreno 03/28/2017, 8:08 AM

## 2017-03-28 NOTE — Anesthesia Postprocedure Evaluation (Signed)
Anesthesia Post Note  Patient: Data processing manager  Procedure(s) Performed: * No procedures listed *     Patient location during evaluation: Mother Baby Anesthesia Type: Epidural Level of consciousness: awake Pain management: satisfactory to patient Vital Signs Assessment: post-procedure vital signs reviewed and stable Respiratory status: spontaneous breathing Cardiovascular status: stable Anesthetic complications: no    Last Vitals:  Vitals:   03/27/17 1949 03/28/17 0509  BP: 139/71 118/65  Pulse: 84 79  Resp: 18 18  Temp: 36.9 C 36.7 C    Last Pain:  Vitals:   03/28/17 0509  TempSrc: Oral  PainSc: 2    Pain Goal:                 [employer]

## 2017-03-29 LAB — RH IG WORKUP (INCLUDES ABO/RH)
ABO/RH(D): O NEG
FETAL SCREEN: NEGATIVE
Gestational Age(Wks): 40
UNIT DIVISION: 0

## 2017-03-29 MED ORDER — IBUPROFEN 600 MG PO TABS: 600.0000 mg | ORAL_TABLET | Freq: Four times a day (QID) | ORAL | 0 refills | Status: DC | PRN

## 2017-03-29 MED ORDER — OXYCODONE-ACETAMINOPHEN 5-325 MG PO TABS: 1.0000 | ORAL_TABLET | Freq: Four times a day (QID) | ORAL | 0 refills | Status: DC | PRN

## 2017-03-29 MED ORDER — ACETAMINOPHEN 325 MG PO TABS: 650.0000 mg | ORAL_TABLET | Freq: Four times a day (QID) | ORAL | 0 refills | Status: DC | PRN

## 2017-03-29 MED ORDER — BENZOCAINE-MENTHOL 20-0.5 % EX AERO: 1.0000 "application " | INHALATION_SPRAY | Freq: Three times a day (TID) | CUTANEOUS | 2 refills | Status: DC | PRN

## 2017-03-29 NOTE — Discharge Summary (Signed)
Obstetric Discharge Summary Reason for Admission: induction of labor Prenatal Procedures: none Intrapartum Procedures: vacuum Postpartum Procedures: none Complications-Operative and Postpartum: none Hemoglobin  Date Value Ref Range Status  03/28/2017 10.5 (L) 12.0 - 15.0 g/dL Final   HCT  Date Value Ref Range Status  03/28/2017 29.3 (L) 36.0 - 46.0 % Final    Physical Exam:  General: alert, cooperative and no distress Lochia: appropriate Uterine Fundus: firm Incision: healing well DVT Evaluation: No evidence of DVT seen on physical exam.  Discharge Diagnoses: Term Pregnancy-delivered  Discharge Information: Date: 03/29/2017 Activity: pelvic rest Diet: routine Medications: PNV Condition: stable Instructions: refer to practice specific booklet Discharge to: home   Newborn Data: Live born female  Birth Weight: 8 lb 0.9 oz (3655 g) APGAR: 8, 9  Home with mother.  Gina Moreno,Gina Moreno E 03/29/2017, 11:18 AM

## 2017-03-29 NOTE — Lactation Note (Signed)
This note was copied from a baby's chart. Lactation Consultation Note  Patient Name: Gina Moreno [id number] Date: 03/29/2017 Reason for consult: Follow-up assessment;1st time breastfeeding;Term  Visited with Mom on day of discharge, baby 81 hrs old.  Baby at 8% weight loss, output 4 stools, and 4 voids last 24 hrs.  Baby breastfed 8 times last 24 hrs.  Mom reports baby is sleepy on the breast, and she is a little sore.  Baby started to cry, so offered to watch, assess, and assist with positioning and latch.  Mom's breast feel a little fuller today.  Hand expression demonstrated, and colostrum easily expressed.  Encouraged Mom to hand express as much as she can, offering baby any colostrum by spoon as an appetizer or dessert.  Reviewed how to do this.  Encouraged STS, baby undressed to diaper.  Mom using football hold on right breast.  Nipple a little bruised on tip.  Reviewed proper support and hand placement.  Mom supporting breast with hand on areola.  Readjusted with explanation on goal of getting baby latched deeply onto breast.  Baby able to latch with assistance deeply.  Reviewed chin tug with FOB.   Baby did slip more shallow after 5 minutes, Mom not supporting baby in closely.  Reviewed how importance this was, and baby re-latched easily.    After a 10 min feeding on right, watched as Mom positioned baby on left breast.  Mom needed some hands off coaching to recognize when baby's mouth was wide enough.  She initially brought baby slowly onto nipple without his mouth opened widely.  Reviewed this again.  Second latch attempt, and baby latched more deeply.  Mom very aware of how the deep latches shouldn't hurt.  Nipple looked rounded after baby came off a deep latch.  Demonstrated alternate breast compression to increase milk transfer.  Identified a few swallows.  Baby does have deep jaw extensions.   Engorgement prevention and treatment discussed.  Mom's first breastfeeding experience didn't  go well.  Mom not sure about milk volume, but does remember giving formula.  She said baby "didn't latch like Braxton does".  Mom describes breast changes, and some tenderness.    Plan- 1- Breastfeed STS on cue, goal is >8-12 feedings per 24 hrs.  Recommend waking baby after 3 hrs, and place STS as much as possible. 2- hand express/double pump (Mom has a Medela DEBP at home) after breastfeeding 4-6 times a day to support her milk supply 3- spoon feed baby any expressed colostrum 4- Call Lactation with any concerns.  Look for >2 stools/2 voids per 24 hrs.    Consult Status Consult Status: Complete Date: 03/29/17 Follow-up type: Call as needed    Gina Moreno 03/29/2017, 10:58 AM

## 2017-05-10 ENCOUNTER — Other Ambulatory Visit: Payer: Self-pay | Admitting: Obstetrics & Gynecology

## 2017-05-10 DIAGNOSIS — N6310 Unspecified lump in the right breast, unspecified quadrant: Secondary | ICD-10-CM

## 2017-05-16 ENCOUNTER — Ambulatory Visit
Admission: RE | Admit: 2017-05-16 | Discharge: 2017-05-16 | Disposition: A | Discharge status: Home / Self Care | Payer: BLUE CROSS/BLUE SHIELD | Source: Ambulatory Visit | Attending: Obstetrics & Gynecology | Admitting: Obstetrics & Gynecology

## 2017-05-16 ENCOUNTER — Other Ambulatory Visit: Payer: Self-pay | Admitting: Obstetrics & Gynecology

## 2017-05-16 DIAGNOSIS — N6310 Unspecified lump in the right breast, unspecified quadrant: Secondary | ICD-10-CM

## 2017-06-07 ENCOUNTER — Encounter: Payer: Self-pay | Admitting: Family Medicine

## 2017-06-07 ENCOUNTER — Ambulatory Visit (INDEPENDENT_AMBULATORY_CARE_PROVIDER_SITE_OTHER): Payer: BLUE CROSS/BLUE SHIELD | Admitting: Family Medicine

## 2017-06-07 VITALS — BP 111/65 | HR 64 | Temp 98.2°F | Resp 18 | Wt 170.2 lb

## 2017-06-07 DIAGNOSIS — Z79899 Other long term (current) drug therapy: Secondary | ICD-10-CM | POA: Diagnosis not present

## 2017-06-07 DIAGNOSIS — K219 Gastro-esophageal reflux disease without esophagitis: Secondary | ICD-10-CM | POA: Diagnosis not present

## 2017-06-07 DIAGNOSIS — G43809 Other migraine, not intractable, without status migrainosus: Secondary | ICD-10-CM

## 2017-06-07 DIAGNOSIS — F32A Depression, unspecified: Secondary | ICD-10-CM

## 2017-06-07 DIAGNOSIS — F419 Anxiety disorder, unspecified: Secondary | ICD-10-CM

## 2017-06-07 DIAGNOSIS — F329 Major depressive disorder, single episode, unspecified: Secondary | ICD-10-CM

## 2017-06-07 DIAGNOSIS — R000 Tachycardia, unspecified: Secondary | ICD-10-CM

## 2017-06-07 MED ORDER — CLONAZEPAM 0.5 MG PO TABS: 0.2500 mg | ORAL_TABLET | Freq: Two times a day (BID) | ORAL | 2 refills | Status: DC | PRN

## 2017-06-07 MED ORDER — RANITIDINE HCL 300 MG PO TABS: 300.0000 mg | ORAL_TABLET | Freq: Every evening | ORAL | 5 refills | Status: DC | PRN

## 2017-06-07 NOTE — Assessment & Plan Note (Signed)
Has been doing well with her pregnancy but still has anxiety attacks at times. She has done well on Klonopin occasionally in past with good results, can refill

## 2017-06-07 NOTE — Assessment & Plan Note (Addendum)
Doing better but still needs the Ranitidine prn, refill given

## 2017-06-07 NOTE — Assessment & Plan Note (Signed)
RRR today 

## 2017-06-07 NOTE — Assessment & Plan Note (Signed)
Encouraged increased hydration, 64 ounces of clear fluids daily. Minimize alcohol and caffeine. Eat small frequent meals with lean proteins and complex carbs. Avoid high and low blood sugars. Get adequate sleep, 7-8 hours a night. Needs exercise daily preferably in the morning. She reports they ar improving

## 2017-06-07 NOTE — Patient Instructions (Signed)

## 2017-06-07 NOTE — Progress Notes (Signed)
Subjective:  I acted as a Education administrator for Dr. Charlett Blake. [place], [place]  Patient ID: Gina Moreno, female    DOB: [birth year], 31 y.o.   MRN: [id number]  No chief complaint on file.   HPI  Patient is in today for a follow up and she is doing well. She is in today with her new 9 month old son. The pregnancy was unremarkable. No recent concerns or hospitalization . She is still struggling with some anxiety attacks and she noted her attacks previously responded to [place] used infrequently.Denies CP/palp/SOB/HA/congestion/fevers/GI or GU c/o. Taking meds as prescribed Patient Care Team: Mosie Lukes, MD as PCP - General (Family Medicine)   Past Medical History:  Diagnosis Date  . Anemia 07/25/2013  . Anxiety   . Anxiety and depression 07/25/2013  . Depression   . Esophageal reflux 07/25/2013  . GERD (gastroesophageal reflux disease)   . Heart murmur    at birth   . History of benign breast tumor   . Hx of varicella   . IBS (irritable bowel syndrome)   . Migraine 03/22/2015  . Other and unspecified hyperlipidemia 07/25/2013  . Preventative health care 07/25/2013  . RLS (restless legs syndrome) 05/20/2014  . Tachycardia 09/16/2014    Past Surgical History:  Procedure Laterality Date  . CHOLECYSTECTOMY N/A 10/25/2015   Procedure: LAPAROSCOPIC CHOLECYSTECTOMY;  Surgeon: Greer Pickerel, MD;  Location: [facility];  Service: General;  Laterality: N/A;  . WISDOM TOOTH EXTRACTION  31 yrs old    Family History  Problem Relation Age of Onset  . Hyperlipidemia Father   . Arthritis Paternal Grandmother   . Cancer Paternal Grandmother        cervical  . Diabetes Paternal Grandmother   . Depression Paternal Grandmother   . Cancer Paternal Grandfather        lung stomach  . Thyroid disease Mother     Social History   Social History  . Marital status: Married    Spouse name: N/A  . Number of children: N/A  . Years of education: N/A   Occupational History  . Not on file.   Social History  Main Topics  . Smoking status: Never Smoker  . Smokeless tobacco: Never Used  . Alcohol use No  . Drug use: No  . Sexual activity: Yes    Partners: Male   Other Topics Concern  . Not on file   Social History Narrative  . No narrative on file    Outpatient Medications Prior to Visit  Medication Sig Dispense Refill  . ibuprofen (ADVIL,MOTRIN) 600 MG tablet Take 1 tablet (600 mg total) by mouth every 6 (six) hours as needed. 90 tablet 0  . acetaminophen (TYLENOL) 325 MG tablet Take 2 tablets (650 mg total) by mouth every 6 (six) hours as needed (for pain scale < 4). 90 tablet 0  . Prenatal Vit-Fe Fumarate-FA (PRENATAL MULTIVITAMIN) TABS tablet Take 1 tablet by mouth daily at 12 noon.    . benzocaine-Menthol (DERMOPLAST) 20-0.5 % AERO Apply 1 application topically 3 (three) times daily as needed for irritation (perineal discomfort). 56 g 2  . oxyCODONE-acetaminophen (PERCOCET/ROXICET) 5-325 MG tablet Take 1 tablet by mouth every 6 (six) hours as needed (pain scale 4-7). 20 tablet 0   No facility-administered medications prior to visit.     Allergies  Allergen Reactions  . Imitrex [Sumatriptan]     Syncope  . Norvasc [Amlodipine Besylate] Other (See Comments)    Pt can't remember what reaction was  to this drug  . Phenergan [Promethazine Hcl]     Uncontrollable arm movement.   . Doxycycline Rash    Review of Systems  Constitutional: Negative for fever and malaise/fatigue.  HENT: Negative for congestion.   Eyes: Negative for blurred vision.  Respiratory: Negative for cough and shortness of breath.   Cardiovascular: Negative for chest pain, palpitations and leg swelling.  Gastrointestinal: Positive for heartburn. Negative for vomiting.  Musculoskeletal: Negative for back pain.  Skin: Negative for rash.  Neurological: Negative for loss of consciousness and headaches.       Objective:    Physical Exam  Constitutional: She is oriented to person, place, and time. She appears  well-developed and well-nourished. No distress.  HENT:  Head: Normocephalic and atraumatic.  Eyes: Conjunctivae are normal.  Neck: Normal range of motion. No thyromegaly present.  Cardiovascular: Normal rate and regular rhythm.   Pulmonary/Chest: Effort normal and breath sounds normal. She has no wheezes.  Abdominal: Soft. Bowel sounds are normal. There is no tenderness.  Musculoskeletal: Normal range of motion. She exhibits no edema or deformity.  Neurological: She is alert and oriented to person, place, and time.  Skin: Skin is warm and dry. She is not diaphoretic.  Psychiatric: She has a normal mood and affect.    BP 111/65 (BP Location: Left Arm, Patient Position: Sitting, Cuff Size: Normal)   Pulse 64   Temp 98.2 F (36.8 C) (Oral)   Resp 18   Wt 170 lb 3.2 oz (77.2 kg)   SpO2 100%   BMI 27.47 kg/m  Wt Readings from Last 3 Encounters:  06/07/17 170 lb 3.2 oz (77.2 kg)  03/27/17 188 lb (85.3 kg)  12/25/16 173 lb (78.5 kg)   BP Readings from Last 3 Encounters:  06/07/17 111/65  03/29/17 (!) 110/54  12/25/16 129/82     Immunization History  Administered Date(s) Administered  . DTaP 08/16/1988, 10/09/1988, 12/07/1988, 10/17/1990, 10/15/1993  . Hepatitis B 05/14/2000, 06/18/2000, 11/13/2000  . HiB (PRP-OMP) 09/17/1989  . IPV 08/16/1988, 10/09/1988, 01/12/1994  . Influenza,inj,Quad PF,6+ Mos 07/23/2013, 05/20/2014  . MMR 09/17/1989, 10/15/1993  . Rho (D) Immune Globulin 03/15/2013  . Td 08/11/2002    Health Maintenance  Topic Date Due  . Samul Dada  08/11/2012  . PAP SMEAR  06/21/2015  . INFLUENZA VACCINE  03/20/2017  . HIV Screening  Completed    Lab Results  Component Value Date   WBC 15.8 (H) 03/28/2017   HGB 10.5 (L) 03/28/2017   HCT 29.3 (L) 03/28/2017   PLT 184 03/28/2017   GLUCOSE 87 12/25/2016   CHOL 249 (H) 03/22/2015   TRIG 141.0 03/22/2015   HDL 63.20 03/22/2015   LDLCALC 157 (H) 03/22/2015   ALT 40 12/25/2016   AST 29 12/25/2016   NA 130  (L) 12/25/2016   K 2.8 (L) 12/25/2016   CL 98 (L) 12/25/2016   CREATININE 0.49 12/25/2016   BUN 9 12/25/2016   CO2 21 (L) 12/25/2016   TSH 1.44 03/22/2015    Lab Results  Component Value Date   TSH 1.44 03/22/2015   Lab Results  Component Value Date   WBC 15.8 (H) 03/28/2017   HGB 10.5 (L) 03/28/2017   HCT 29.3 (L) 03/28/2017   MCV 92.1 03/28/2017   PLT 184 03/28/2017   Lab Results  Component Value Date   NA 130 (L) 12/25/2016   K 2.8 (L) 12/25/2016   CO2 21 (L) 12/25/2016   GLUCOSE 87 12/25/2016   BUN 9 12/25/2016  CREATININE 0.49 12/25/2016   BILITOT 1.0 12/25/2016   ALKPHOS 117 12/25/2016   AST 29 12/25/2016   ALT 40 12/25/2016   PROT 7.3 12/25/2016   ALBUMIN 3.3 (L) 12/25/2016   CALCIUM 8.2 (L) 12/25/2016   ANIONGAP 11 12/25/2016   GFR 111.97 09/28/2015   Lab Results  Component Value Date   CHOL 249 (H) 03/22/2015   Lab Results  Component Value Date   HDL 63.20 03/22/2015   Lab Results  Component Value Date   LDLCALC 157 (H) 03/22/2015   Lab Results  Component Value Date   TRIG 141.0 03/22/2015   Lab Results  Component Value Date   CHOLHDL 4 03/22/2015   No results found for: HGBA1C       Assessment & Plan:   Problem List Items Addressed This Visit    Esophageal reflux    Doing better but still needs the Ranitidine prn, refill given      Relevant Medications   ranitidine (ZANTAC) 300 MG tablet   Anxiety and depression    Has been doing well with her pregnancy but still has anxiety attacks at times. She has done well on Klonopin occasionally in past with good results, can refill      Tachycardia    RRR today      Migraine    Encouraged increased hydration, 64 ounces of clear fluids daily. Minimize alcohol and caffeine. Eat small frequent meals with lean proteins and complex carbs. Avoid high and low blood sugars. Get adequate sleep, 7-8 hours a night. Needs exercise daily preferably in the morning. She reports they ar improving        Relevant Medications   clonazePAM (KLONOPIN) 0.5 MG tablet    Other Visit Diagnoses    High risk medication use    -  Primary   Relevant Orders   Pain Mgmt, Profile 8 w/Conf, U      I have discontinued Ms. Landing's prenatal multivitamin, acetaminophen, benzocaine-Menthol, oxyCODONE-acetaminophen, and ranitidine. I am also having her start on clonazePAM and ranitidine. Additionally, I am having her maintain her ibuprofen and SPRINTEC 28.  Meds ordered this encounter  Medications  . SPRINTEC 28 0.25-35 MG-MCG tablet    Sig: Take 1 tablet by mouth daily.    Refill:  11  . DISCONTD: ranitidine (ZANTAC) 300 MG tablet    Sig: Take 1 tablet by mouth as needed.    Refill:  0  . clonazePAM (KLONOPIN) 0.5 MG tablet    Sig: Take 0.5-1 tablets (0.25-0.5 mg total) by mouth 2 (two) times daily as needed for anxiety.    Dispense:  30 tablet    Refill:  2  . ranitidine (ZANTAC) 300 MG tablet    Sig: Take 1 tablet (300 mg total) by mouth at bedtime as needed for heartburn.    Dispense:  30 tablet    Refill:  5    CMA served as scribe during this visit. History, Physical and Plan performed by medical provider. Documentation and orders reviewed and attested to.  Penni Homans, MD

## 2017-06-08 LAB — PAIN MGMT, PROFILE 8 W/CONF, U
6 ACETYLMORPHINE: NEGATIVE ng/mL (ref <10)
Alcohol Metabolites: NEGATIVE ng/mL (ref <500)
Amphetamines: NEGATIVE ng/mL (ref <500)
BUPRENORPHINE, URINE: NEGATIVE ng/mL (ref <5)
Benzodiazepines: NEGATIVE ng/mL (ref <100)
CREATININE: 118.3 mg/dL
Cocaine Metabolite: NEGATIVE ng/mL (ref <150)
MDMA: NEGATIVE ng/mL (ref <500)
Marijuana Metabolite: NEGATIVE ng/mL (ref <20)
OPIATES: NEGATIVE ng/mL (ref <100)
OXIDANT: NEGATIVE ug/mL (ref <200)
OXYCODONE: NEGATIVE ng/mL (ref <100)
PH: 5.72 (ref 4.5–9.0)

## 2017-08-14 ENCOUNTER — Ambulatory Visit: Payer: BLUE CROSS/BLUE SHIELD | Admitting: Family Medicine

## 2017-08-15 ENCOUNTER — Ambulatory Visit: Payer: BLUE CROSS/BLUE SHIELD | Admitting: Family Medicine

## 2017-08-19 ENCOUNTER — Encounter: Payer: Self-pay | Admitting: Family Medicine

## 2017-08-21 ENCOUNTER — Other Ambulatory Visit: Payer: Self-pay | Admitting: Family Medicine

## 2017-08-21 DIAGNOSIS — L989 Disorder of the skin and subcutaneous tissue, unspecified: Secondary | ICD-10-CM

## 2017-08-29 ENCOUNTER — Telehealth: Payer: Self-pay | Admitting: Family Medicine

## 2017-08-29 NOTE — Telephone Encounter (Signed)
Please advise 

## 2017-08-29 NOTE — Telephone Encounter (Signed)
Copied from [place]. Topic: Referral - Request >> Aug 29, 2017  1:46 PM Malena Catholic I, [place] wrote: Reason for CRM: pt call  Michela Pitcher the referral was  send to  the Wrong Office please sent it to [facility] @ [address] [place] in [place]

## 2017-09-02 ENCOUNTER — Encounter: Payer: Self-pay | Admitting: Family Medicine

## 2017-09-02 ENCOUNTER — Ambulatory Visit: Payer: BLUE CROSS/BLUE SHIELD | Admitting: Family Medicine

## 2017-09-02 ENCOUNTER — Telehealth: Payer: Self-pay | Admitting: Family Medicine

## 2017-09-02 DIAGNOSIS — F329 Major depressive disorder, single episode, unspecified: Secondary | ICD-10-CM

## 2017-09-02 DIAGNOSIS — B958 Unspecified staphylococcus as the cause of diseases classified elsewhere: Secondary | ICD-10-CM | POA: Insufficient documentation

## 2017-09-02 DIAGNOSIS — F32A Depression, unspecified: Secondary | ICD-10-CM

## 2017-09-02 DIAGNOSIS — E782 Mixed hyperlipidemia: Secondary | ICD-10-CM | POA: Diagnosis not present

## 2017-09-02 DIAGNOSIS — L989 Disorder of the skin and subcutaneous tissue, unspecified: Secondary | ICD-10-CM

## 2017-09-02 DIAGNOSIS — F988 Other specified behavioral and emotional disorders with onset usually occurring in childhood and adolescence: Secondary | ICD-10-CM | POA: Diagnosis not present

## 2017-09-02 DIAGNOSIS — F419 Anxiety disorder, unspecified: Secondary | ICD-10-CM | POA: Diagnosis not present

## 2017-09-02 MED ORDER — MUPIROCIN 2 % EX OINT: 1.0000 "application " | TOPICAL_OINTMENT | Freq: Every day | CUTANEOUS | 1 refills | Status: DC

## 2017-09-02 MED ORDER — CLONAZEPAM 0.5 MG PO TABS: 0.2500 mg | ORAL_TABLET | Freq: Two times a day (BID) | ORAL | 2 refills | Status: DC | PRN

## 2017-09-02 NOTE — Assessment & Plan Note (Signed)
Lesions in nose and on skin after hospitalization of last son who is 81 months old. Bleach baths and Mupirocin ointment to nares and lesions prn

## 2017-09-02 NOTE — Telephone Encounter (Signed)
Did you give her the number and fax number or do I need to contact her to give?  Please advise

## 2017-09-02 NOTE — Assessment & Plan Note (Signed)
Encouraged heart healthy diet, increase exercise, avoid trans fats, consider a krill oil cap daily 

## 2017-09-02 NOTE — Assessment & Plan Note (Signed)
Is stressing at home taking care of her 54 month old and a 30 year old without much help. Feels her ADD is making it work.

## 2017-09-02 NOTE — Progress Notes (Signed)
Subjective:  I acted as a Education administrator for Dr. Charlett Blake. Gina Moreno, [place]  Patient ID: Gina Moreno, female    DOB: [birth year], 32 y.o.   MRN: [id number]  No chief complaint on file.   HPI  Patient is in today for a 3 month follow up. Patient states she has a spot on her back that started in November and has been growing and is itchy and irritated. She is getting ready to see [place] derm . 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 Has also noted some lesions in nares since then as well. Denies CP/palp/SOB/HA/congestion/fevers/GI or GU c/o. Taking meds as prescribed  Patient Care Team: Mosie Lukes, MD as PCP - General (Family Medicine)   Past Medical History:  Diagnosis Date  . Anemia 07/25/2013  . Anxiety   . Anxiety and depression 07/25/2013  . Depression   . Esophageal reflux 07/25/2013  . GERD (gastroesophageal reflux disease)   . Heart murmur    at birth   . History of benign breast tumor   . Hx of varicella   . IBS (irritable bowel syndrome)   . Migraine 03/22/2015  . Other and unspecified hyperlipidemia 07/25/2013  . Preventative health care 07/25/2013  . RLS (restless legs syndrome) 05/20/2014  . Tachycardia 09/16/2014    Past Surgical History:  Procedure Laterality Date  . CHOLECYSTECTOMY N/A 10/25/2015   Procedure: LAPAROSCOPIC CHOLECYSTECTOMY;  Surgeon: Greer Pickerel, MD;  Location: [facility];  Service: General;  Laterality: N/A;  . WISDOM TOOTH EXTRACTION  32 yrs old    Family History  Problem Relation Age of Onset  . Hyperlipidemia Father   . Arthritis Paternal Grandmother   . Cancer Paternal Grandmother        cervical  . Diabetes Paternal Grandmother   . Depression Paternal Grandmother   . Cancer Paternal Grandfather        lung stomach  . Thyroid disease Mother     Social History   Socioeconomic History  . Marital status: Married    Spouse name: Not on file  . Number of children: Not on file  .  Years of education: Not on file  . Highest education level: Not on file  Social Needs  . Financial resource strain: Not on file  . Food insecurity - worry: Not on file  . Food insecurity - inability: Not on file  . Transportation needs - medical: Not on file  . Transportation needs - non-medical: Not on file  Occupational History  . Not on file  Tobacco Use  . Smoking status: Never Smoker  . Smokeless tobacco: Never Used  Substance and Sexual Activity  . Alcohol use: No  . Drug use: No  . Sexual activity: Yes    Partners: Male  Other Topics Concern  . Not on file  Social History Narrative  . Not on file    Outpatient Medications Prior to Visit  Medication Sig Dispense Refill  . ibuprofen (ADVIL,MOTRIN) 600 MG tablet Take 1 tablet (600 mg total) by mouth every 6 (six) hours as needed. 90 tablet 0  . ranitidine (ZANTAC) 300 MG tablet Take 1 tablet (300 mg total) by mouth at bedtime as needed for heartburn. 30 tablet 5  . SPRINTEC 28 0.25-35 MG-MCG tablet Take 1 tablet by mouth daily.  11  . clonazePAM (KLONOPIN) 0.5 MG tablet Take 0.5-1 tablets (0.25-0.5 mg total) by mouth 2 (two) times daily  as needed for anxiety. 30 tablet 2   No facility-administered medications prior to visit.     Allergies  Allergen Reactions  . Sulfamethoxazole Dermatitis  . Amlodipine Other (See Comments)    "made feel weird"  . Imitrex [Sumatriptan]     Syncope  . Norvasc [Amlodipine Besylate] Other (See Comments)    Pt can't remember what reaction was to this drug  . Phenergan [Promethazine Hcl]     Uncontrollable arm movement.   . Promethazine     Uncontrollable arm movement.   . Sulfamethoxazole-Trimethoprim Itching  . Sumatriptan Succinate     Syncope  . Doxycycline Rash    Review of Systems  Constitutional: Negative for fever and malaise/fatigue.  HENT: Negative for congestion.   Eyes: Negative for blurred vision.  Respiratory: Negative for shortness of breath.   Cardiovascular:  Negative for chest pain, palpitations and leg swelling.  Gastrointestinal: Negative for abdominal pain, blood in stool and nausea.  Genitourinary: Negative for dysuria and frequency.  Musculoskeletal: Negative for falls.  Skin: Positive for rash.  Neurological: Negative for dizziness, loss of consciousness and headaches.  Endo/Heme/Allergies: Negative for environmental allergies.  Psychiatric/Behavioral: Negative for depression. The patient is nervous/anxious.        Objective:    Physical Exam  Constitutional: She is oriented to person, place, and time. She appears well-developed and well-nourished. No distress.  HENT:  Head: Normocephalic and atraumatic.  Nose: Nose normal.  Eyes: Right eye exhibits no discharge. Left eye exhibits no discharge.  Neck: Normal range of motion. Neck supple.  Cardiovascular: Normal rate and regular rhythm.  No murmur heard. Pulmonary/Chest: Effort normal and breath sounds normal.  Abdominal: Soft. Bowel sounds are normal. There is no tenderness.  Musculoskeletal: She exhibits no edema.  Neurological: She is alert and oriented to person, place, and time.  Skin: Skin is warm and dry.  Psychiatric: She has a normal mood and affect.  Nursing note and vitals reviewed.   BP 122/82 (BP Location: Left Arm, Patient Position: Sitting, Cuff Size: Normal)   Pulse 96   Temp 97.6 F (36.4 C) (Oral)   Resp 18   Wt 168 lb 6.4 oz (76.4 kg)   SpO2 96%   BMI 27.18 kg/m  Wt Readings from Last 3 Encounters:  09/02/17 168 lb 6.4 oz (76.4 kg)  06/07/17 170 lb 3.2 oz (77.2 kg)  03/27/17 188 lb (85.3 kg)   BP Readings from Last 3 Encounters:  09/02/17 122/82  06/07/17 111/65  03/29/17 (!) 110/54     Immunization History  Administered Date(s) Administered  . DTaP 08/16/1988, 10/09/1988, 12/07/1988, 10/17/1990, 10/15/1993  . Hepatitis B 05/14/2000, 06/18/2000, 11/13/2000  . HiB (PRP-OMP) 09/17/1989  . IPV 08/16/1988, 10/09/1988, 01/12/1994  .  Influenza,inj,Quad PF,6+ Mos 07/23/2013, 05/20/2014  . MMR 09/17/1989, 10/15/1993  . Rho (D) Immune Globulin 03/15/2013  . Td 08/11/2002    Health Maintenance  Topic Date Due  . Samul Dada  08/11/2012  . PAP SMEAR  06/21/2015  . INFLUENZA VACCINE  03/20/2017  . HIV Screening  Completed    Lab Results  Component Value Date   WBC 15.8 (H) 03/28/2017   HGB 10.5 (L) 03/28/2017   HCT 29.3 (L) 03/28/2017   PLT 184 03/28/2017   GLUCOSE 87 12/25/2016   CHOL 249 (H) 03/22/2015   TRIG 141.0 03/22/2015   HDL 63.20 03/22/2015   LDLCALC 157 (H) 03/22/2015   ALT 40 12/25/2016   AST 29 12/25/2016   NA 130 (L) 12/25/2016  K 2.8 (L) 12/25/2016   CL 98 (L) 12/25/2016   CREATININE 0.49 12/25/2016   BUN 9 12/25/2016   CO2 21 (L) 12/25/2016   TSH 1.44 03/22/2015    Lab Results  Component Value Date   TSH 1.44 03/22/2015   Lab Results  Component Value Date   WBC 15.8 (H) 03/28/2017   HGB 10.5 (L) 03/28/2017   HCT 29.3 (L) 03/28/2017   MCV 92.1 03/28/2017   PLT 184 03/28/2017   Lab Results  Component Value Date   NA 130 (L) 12/25/2016   K 2.8 (L) 12/25/2016   CO2 21 (L) 12/25/2016   GLUCOSE 87 12/25/2016   BUN 9 12/25/2016   CREATININE 0.49 12/25/2016   BILITOT 1.0 12/25/2016   ALKPHOS 117 12/25/2016   AST 29 12/25/2016   ALT 40 12/25/2016   PROT 7.3 12/25/2016   ALBUMIN 3.3 (L) 12/25/2016   CALCIUM 8.2 (L) 12/25/2016   ANIONGAP 11 12/25/2016   GFR 111.97 09/28/2015   Lab Results  Component Value Date   CHOL 249 (H) 03/22/2015   Lab Results  Component Value Date   HDL 63.20 03/22/2015   Lab Results  Component Value Date   LDLCALC 157 (H) 03/22/2015   Lab Results  Component Value Date   TRIG 141.0 03/22/2015   Lab Results  Component Value Date   CHOLHDL 4 03/22/2015   No results found for: HGBA1C       Assessment & Plan:   Problem List Items Addressed This Visit    Anxiety and depression    Is stressing at home taking care of her 45 month old and  a 30 year old without much help. Feels her ADD is making it work.       Hyperlipidemia, mixed    Encouraged heart healthy diet, increase exercise, avoid trans fats, consider a krill oil cap daily      ADD (attention deficit disorder)    Was diagnosed as a teen and is ready to have retesting done. Has is having trouble finishing tasks and is having increased anxiety as a result. Will send to [facility] if her insurance accepts them for further work up.       Skin lesion of back    Left shoulder blade, has referral to [place] derm      Staph infection    Lesions in nose and on skin after hospitalization of last son who is 23 months old. Bleach baths and Mupirocin ointment to nares and lesions prn      Relevant Medications   mupirocin ointment (BACTROBAN) 2 %      I am having Gina Moreno start on mupirocin ointment. I am also having her maintain her ibuprofen, SPRINTEC 28, ranitidine, and clonazePAM.  Meds ordered this encounter  Medications  . mupirocin ointment (BACTROBAN) 2 %    Sig: Place 1 application into the nose at bedtime. Can apply to topical lesions    Dispense:  22 g    Refill:  1  . clonazePAM (KLONOPIN) 0.5 MG tablet    Sig: Take 0.5-1 tablets (0.25-0.5 mg total) by mouth 2 (two) times daily as needed for anxiety.    Dispense:  30 tablet    Refill:  2   CMA served as scribe during this visit. History, Physical and Plan performed by medical provider. Documentation and orders reviewed and attested to.  Penni Homans, MD

## 2017-09-02 NOTE — Patient Instructions (Addendum)
Try bathing twice weekly with 1 tbls of bleach in bath Take a probiotic daily  Staphylococcal Infection Staphylococcus aureus, also known as staph, is a bacteria that can cause infections. The most common type of staph infection is a skin infection. Staph can spread easily from one person to another. How is this diagnosed? Your health care provider may diagnose and treat a staph infection based on an exam. He or she may also do a culture from the affected area:  To find out exactly what type of bacteria is causing your infection.  To decide which medicine is best to treat it.  How is this treated? Most staph infections can be easily treated with antibiotic medicine. Some infections must be drained. More serious types of staph, including methicillin-resistant Staphylococcus aureus (MRSA) may be more difficult to treat. They may require a strong antibiotic or more than one antibiotic. Follow these instructions at home:  Take your antibiotic as directed by your health care provider. Finish your antibiotic even if you start to feel better.  Keep all follow-up visits as directed by your health care provider.  Tell all of your health care providers including dentists that you have a staph infection, especially if you have been diagnosed with MRSA.  Ask your health care provider when it is safe for you to return to school or work.  To prevent spreading the infection: ? Keep infections and pus or drainage material covered with clean, dry bandages (dressings), or as directed by your health care provider. ? Wash your hands with soap and water often, especially after changing dressings or touching your infection. ? Advise your family and other close contacts to wash their hands frequently with soap and water. They should always wash their hands after changing your dressings, touching the infected area, or touching infected materials. ? Avoid sharing personal items that may have had contact with your  infection. These include towels, washcloths, razors, clothing, and uniforms. ? Wash your linens and clothes with hot water and laundry detergent. Drying clothes in a hot dryer-rather than air-drying-also helps to kill bacteria in clothes. Contact a health care provider if: Although they are usually easy to treat, some staph infections can become very serious. You should contact your health care provider if your infection gets worse or if you have a fever. This information is not intended to replace advice given to you by your health care provider. Make sure you discuss any questions you have with your health care provider. Document Released: 08/27/2014 Document Revised: 01/12/2016 Document Reviewed: 05/11/2014 Elsevier Interactive Patient Education  [employer].

## 2017-09-02 NOTE — Telephone Encounter (Signed)
Copied from [place]. Topic: Referral - Request >> Sep 02, 2017 12:05 PM Cecelia Byars, [place] wrote: Reason for CRM: Patient called to verify the fax number for the Dermatologist ,  their fax number is  [phone]   please call [phone] with any questions  Attention Barnett Applebaum

## 2017-09-02 NOTE — Assessment & Plan Note (Signed)
Left shoulder blade, has referral to [place] derm

## 2017-09-02 NOTE — Assessment & Plan Note (Signed)
Was diagnosed as a teen and is ready to have retesting done. Has is having trouble finishing tasks and is having increased anxiety as a result. Will send to [facility] if her insurance accepts them for further work up.

## 2017-09-04 NOTE — Telephone Encounter (Signed)
The referral was sent to [facility]

## 2017-11-18 ENCOUNTER — Ambulatory Visit
Admission: RE | Admit: 2017-11-18 | Discharge: 2017-11-18 | Disposition: A | Discharge status: Home / Self Care | Payer: BLUE CROSS/BLUE SHIELD | Source: Ambulatory Visit | Attending: Obstetrics & Gynecology | Admitting: Obstetrics & Gynecology

## 2017-11-18 ENCOUNTER — Other Ambulatory Visit: Payer: Self-pay | Admitting: Obstetrics & Gynecology

## 2017-11-18 ENCOUNTER — Other Ambulatory Visit: Payer: BLUE CROSS/BLUE SHIELD

## 2017-11-18 DIAGNOSIS — N6310 Unspecified lump in the right breast, unspecified quadrant: Secondary | ICD-10-CM

## 2017-12-03 ENCOUNTER — Ambulatory Visit: Payer: BLUE CROSS/BLUE SHIELD | Admitting: Family Medicine

## 2018-02-18 ENCOUNTER — Encounter: Payer: Self-pay | Admitting: Family Medicine

## 2018-03-03 ENCOUNTER — Encounter: Payer: BLUE CROSS/BLUE SHIELD | Admitting: Family Medicine

## 2018-03-10 ENCOUNTER — Encounter: Payer: Self-pay | Admitting: Family Medicine

## 2018-03-10 ENCOUNTER — Other Ambulatory Visit: Payer: Self-pay

## 2018-03-10 ENCOUNTER — Ambulatory Visit: Payer: BLUE CROSS/BLUE SHIELD | Admitting: Family Medicine

## 2018-03-10 VITALS — BP 104/70 | HR 67 | Temp 98.8°F | Ht 65.5 in | Wt 164.4 lb

## 2018-03-10 DIAGNOSIS — G43009 Migraine without aura, not intractable, without status migrainosus: Secondary | ICD-10-CM

## 2018-03-10 DIAGNOSIS — F819 Developmental disorder of scholastic skills, unspecified: Secondary | ICD-10-CM | POA: Insufficient documentation

## 2018-03-10 DIAGNOSIS — Z3041 Encounter for surveillance of contraceptive pills: Secondary | ICD-10-CM

## 2018-03-10 DIAGNOSIS — K219 Gastro-esophageal reflux disease without esophagitis: Secondary | ICD-10-CM | POA: Diagnosis not present

## 2018-03-10 DIAGNOSIS — F988 Other specified behavioral and emotional disorders with onset usually occurring in childhood and adolescence: Secondary | ICD-10-CM

## 2018-03-10 DIAGNOSIS — J301 Allergic rhinitis due to pollen: Secondary | ICD-10-CM | POA: Diagnosis not present

## 2018-03-10 DIAGNOSIS — Z8659 Personal history of other mental and behavioral disorders: Secondary | ICD-10-CM

## 2018-03-10 MED ORDER — OMEPRAZOLE 20 MG PO CPDR: 20.0000 mg | DELAYED_RELEASE_CAPSULE | Freq: Every day | ORAL | 3 refills | Status: DC

## 2018-03-10 MED ORDER — CETIRIZINE HCL 10 MG PO TABS: 10.0000 mg | ORAL_TABLET | Freq: Every day | ORAL | 3 refills | Status: DC

## 2018-03-10 NOTE — Patient Instructions (Addendum)
Please return in 3 months for your annual complete physical; please come fasting.   It was a pleasure meeting you today! Thank you for choosing [place] to meet your healthcare needs! I truly look forward to working with you. If you have any questions or concerns, please send me a message via Mychart or call the office at [phone].   Gastroesophageal Reflux Disease, Adult Normally, food travels down the esophagus and stays in the stomach to be digested. However, when a person has gastroesophageal reflux disease (GERD), food and stomach acid move back up into the esophagus. When this happens, the esophagus becomes sore and inflamed. Over time, GERD can create small holes (ulcers) in the lining of the esophagus. What are the causes? This condition is caused by a problem with the muscle between the esophagus and the stomach (lower esophageal sphincter, or LES). Normally, the LES muscle closes after food passes through the esophagus to the stomach. When the LES is weakened or abnormal, it does not close properly, and that allows food and stomach acid to go back up into the esophagus. The LES can be weakened by certain dietary substances, medicines, and medical conditions, including:  Tobacco use.  Pregnancy.  Having a hiatal hernia.  Heavy alcohol use.  Certain foods and beverages, such as coffee, chocolate, onions, and peppermint.  What increases the risk? This condition is more likely to develop in:  People who have an increased body weight.  People who have connective tissue disorders.  People who use NSAID medicines.  What are the signs or symptoms? Symptoms of this condition include:  Heartburn.  Difficult or painful swallowing.  The feeling of having a lump in the throat.  Abitter taste in the mouth.  Bad breath.  Having a large amount of saliva.  Having an upset or bloated stomach.  Belching.  Chest pain.  Shortness of breath or wheezing.  Ongoing (chronic)  cough or a night-time cough.  Wearing away of tooth enamel.  Weight loss.  Different conditions can cause chest pain. Make sure to see your health care provider if you experience chest pain. How is this diagnosed? Your health care provider will take a medical history and perform a physical exam. To determine if you have mild or severe GERD, your health care provider may also monitor how you respond to treatment. You may also have other tests, including:  An endoscopy toexamine your stomach and esophagus with a small camera.  A test thatmeasures the acidity level in your esophagus.  A test thatmeasures how much pressure is on your esophagus.  A barium swallow or modified barium swallow to show the shape, size, and functioning of your esophagus.  How is this treated? The goal of treatment is to help relieve your symptoms and to prevent complications. Treatment for this condition may vary depending on how severe your symptoms are. Your health care provider may recommend:  Changes to your diet.  Medicine.  Surgery.  Follow these instructions at home: Diet  Follow a diet as recommended by your health care provider. This may involve avoiding foods and drinks such as: ? Coffee and tea (with or without caffeine). ? Drinks that containalcohol. ? Energy drinks and sports drinks. ? Carbonated drinks or sodas. ? Chocolate and cocoa. ? Peppermint and mint flavorings. ? Garlic and onions. ? Horseradish. ? Spicy and acidic foods, including peppers, chili powder, curry powder, vinegar, hot sauces, and barbecue sauce. ? Citrus fruit juices and citrus fruits, such as oranges, lemons, and  limes. ? Tomato-based foods, such as red sauce, chili, salsa, and pizza with red sauce. ? Fried and fatty foods, such as donuts, french fries, potato chips, and high-fat dressings. ? High-fat meats, such as hot dogs and fatty cuts of red and white meats, such as rib eye steak, sausage, ham, and  bacon. ? High-fat dairy items, such as whole milk, butter, and cream cheese.  Eat small, frequent meals instead of large meals.  Avoid drinking large amounts of liquid with your meals.  Avoid eating meals during the 2-3 hours before bedtime.  Avoid lying down right after you eat.  Do not exercise right after you eat. General instructions  Pay attention to any changes in your symptoms.  Take over-the-counter and prescription medicines only as told by your health care provider. Do not take aspirin, ibuprofen, or other NSAIDs unless your health care provider told you to do so.  Do not use any tobacco products, including cigarettes, chewing tobacco, and e-cigarettes. If you need help quitting, ask your health care provider.  Wear loose-fitting clothing. Do not wear anything tight around your waist that causes pressure on your abdomen.  Raise (elevate) the head of your bed 6 inches (15cm).  Try to reduce your stress, such as with yoga or meditation. If you need help reducing stress, ask your health care provider.  If you are overweight, reduce your weight to an amount that is healthy for you. Ask your health care provider for guidance about a safe weight loss goal.  Keep all follow-up visits as told by your health care provider. This is important. Contact a health care provider if:  You have new symptoms.  You have unexplained weight loss.  You have difficulty swallowing, or it hurts to swallow.  You have wheezing or a persistent cough.  Your symptoms do not improve with treatment.  You have a hoarse voice. Get help right away if:  You have pain in your arms, neck, jaw, teeth, or back.  You feel sweaty, dizzy, or light-headed.  You have chest pain or shortness of breath.  You vomit and your vomit looks like blood or coffee grounds.  You faint.  Your stool is bloody or black.  You cannot swallow, drink, or eat. This information is not intended to replace advice  given to you by your health care provider. Make sure you discuss any questions you have with your health care provider. Document Released: 05/16/2005 Document Revised: 01/04/2016 Document Reviewed: 12/01/2014 Elsevier Interactive Patient Education  2018 [place].  Strep Throat Strep throat is a bacterial infection of the throat. Your health care provider may call the infection tonsillitis or pharyngitis, depending on whether there is swelling in the tonsils or at the back of the throat. Strep throat is most common during the cold months of the year in children who are 67-48 years of age, but it can happen during any season in people of any age. This infection is spread from person to person (contagious) through coughing, sneezing, or close contact. What are the causes? Strep throat is caused by the bacteria called Streptococcus pyogenes. What increases the risk? This condition is more likely to develop in:  People who spend time in crowded places where the infection can spread easily.  People who have close contact with someone who has strep throat.  What are the signs or symptoms? Symptoms of this condition include:  Fever or chills.  Redness, swelling, or pain in the tonsils or throat.  Pain or difficulty  when swallowing.  White or yellow spots on the tonsils or throat.  Swollen, tender glands in the neck or under the jaw.  Red rash all over the body (rare).  How is this diagnosed? This condition is diagnosed by performing a rapid strep test or by taking a swab of your throat (throat culture test). Results from a rapid strep test are usually ready in a few minutes, but throat culture test results are available after one or two days. How is this treated? This condition is treated with antibiotic medicine. Follow these instructions at home: Medicines  Take over-the-counter and prescription medicines only as told by your health care provider.  Take your antibiotic as told by  your health care provider. Do not stop taking the antibiotic even if you start to feel better.  Have family members who also have a sore throat or fever tested for strep throat. They may need antibiotics if they have the strep infection. Eating and drinking  Do not share food, drinking cups, or personal items that could cause the infection to spread to other people.  If swallowing is difficult, try eating soft foods until your sore throat feels better.  Drink enough fluid to keep your urine clear or pale yellow. General instructions  Gargle with a salt-water mixture 3-4 times per day or as needed. To make a salt-water mixture, completely dissolve -1 tsp of salt in 1 cup of warm water.  Make sure that all household members wash their hands well.  Get plenty of rest.  Stay home from school or work until you have been taking antibiotics for 24 hours.  Keep all follow-up visits as told by your health care provider. This is important. Contact a health care provider if:  The glands in your neck continue to get bigger.  You develop a rash, cough, or earache.  You cough up a thick liquid that is green, yellow-brown, or bloody.  You have pain or discomfort that does not get better with medicine.  Your problems seem to be getting worse rather than better.  You have a fever. Get help right away if:  You have new symptoms, such as vomiting, severe headache, stiff or painful neck, chest pain, or shortness of breath.  You have severe throat pain, drooling, or changes in your voice.  You have swelling of the neck, or the skin on the neck becomes red and tender.  You have signs of dehydration, such as fatigue, dry mouth, and decreased urination.  You become increasingly sleepy, or you cannot wake up completely.  Your joints become red or painful. This information is not intended to replace advice given to you by your health care provider. Make sure you discuss any questions you have  with your health care provider. Document Released: 08/03/2000 Document Revised: 04/04/2016 Document Reviewed: 11/29/2014 Elsevier Interactive Patient Education  [employer].

## 2018-03-10 NOTE — Progress Notes (Signed)
Subjective  CC:  Chief Complaint  Patient presents with  . Establish Care    Transfer from Dr. Randel Pigg, last physical 5 years   . Abdominal Pain    x 1 year, off and on     HPI: Gina Moreno is a 32 y.o. female who presents to [facility] today to establish care with me as a new patient.  I have reviewed notes.  She has the following concerns or needs:  32 year old G2 P12-0-0-2, 2 sons, age 50 and 40.  Married.  Overall happy and healthy.  We reviewed her past medical history in detail.  History of anxiety treated with multiple SSRIs.  Since having children, these things have calm down.  She no longer feels anxious.  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  Her motions are not negatively impacting her.  History of ADD treated from young childhood to adolescence.  Feels ADD is currently not active.  No longer needing medications.  Adderall worked well for her.  Chronic GERD: Has been evaluated multiple times over the recent years for midepigastric pain.  Treated with Zantac and improved.  Did have cholelithiasis status post cholecystectomy in 2017.  Complains of a.m. nausea which was similar to her GERD symptoms in the past.  PRN reflux symptoms after spicy meals.  No chronic abdominal pain.  No change in bowel movements.  No melena.  No weight changes.  Takes as needed Tums which does help symptoms.  History of negative H. pylori testing.  Borderline hyperlipidemia last checked 2017.  Admits to poor diet.  Little exercise  Oral contraceptive user.  Assessment  1. Gastroesophageal reflux disease, esophagitis presence not specified   2. Attention deficit disorder (ADD) without hyperactivity   3. Migraine without aura and responsive to treatment   4. History of anxiety   5. Oral contraceptive use   6. Seasonal allergic rhinitis due to pollen      Plan   GERD: Education given.  Start Prilosec daily for the next 6 to 12 weeks.  Then  reevaluate.  Avoid spicy foods.  ADD and history of anxiety now not active.  Patient to monitor her symptoms over time.  Will readdress if they recur.  Menstrual migraines which are mild.  They are controlled with Advil.  No further treatment at this time.  Will monitor  OCP use  Allergies: Start Zyrtec nightly  Follow up:  Return in about 3 months (around 06/10/2018) for complete physical, f/u GERD. Orders Placed This Encounter  Procedures  . HM PAP SMEAR   Meds ordered this encounter  Medications  . omeprazole (PRILOSEC) 20 MG capsule    Sig: Take 1 capsule (20 mg total) by mouth daily.    Dispense:  30 capsule    Refill:  3  . cetirizine (ZYRTEC) 10 MG tablet    Sig: Take 1 tablet (10 mg total) by mouth daily.    Dispense:  90 tablet    Refill:  3     Depression screen PHQ 2/9 03/10/2018  Decreased Interest 0  Down, Depressed, Hopeless 0  PHQ - 2 Score 0  Altered sleeping 0  Tired, decreased energy 0  Change in appetite 0  Feeling bad or failure about yourself  0  Trouble concentrating 0  Moving slowly or fidgety/restless 0  Suicidal thoughts 0  PHQ-9 Score 0  Difficult doing work/chores Not difficult at all    We updated and reviewed the  patient's past history in detail and it is documented below.  Patient Active Problem List   Diagnosis Date Noted  . Oral contraceptive use 03/10/2018    Priority: High  . Migraine without aura and responsive to treatment 03/22/2015    Priority: High    Mild; treated with advil; associated with menses at times, monthly   . Learning disabilities 03/10/2018    Priority: Medium  . ADD (attention deficit disorder) - mild 09/02/2017    Priority: Medium    Treated in school; not treated now. Doing ok.    Marland Kitchen GERD (gastroesophageal reflux disease) 07/25/2013    Priority: Medium  . History of anxiety 07/25/2013    Priority: Medium    Has failed, Cymbalta, Paxil, Wellbutrin, Zoloft and Lexapro, Effexor, Prozac   . Hyperlipidemia,  mixed 07/25/2013    Priority: Medium  . Seasonal allergic rhinitis due to pollen 03/10/2018   Health Maintenance  Topic Date Due  . Samul Dada  08/11/2012  . INFLUENZA VACCINE  03/20/2018  . PAP SMEAR  07/31/2019  . HIV Screening  Completed   Immunization History  Administered Date(s) Administered  . DTaP 08/16/1988, 10/09/1988, 12/07/1988, 10/17/1990, 10/15/1993  . Hepatitis B 05/14/2000, 06/18/2000, 11/13/2000  . HiB (PRP-OMP) 09/17/1989  . IPV 08/16/1988, 10/09/1988, 01/12/1994  . Influenza,inj,Quad PF,6+ Mos 07/23/2013, 05/20/2014  . MMR 09/17/1989, 10/15/1993  . Rho (D) Immune Globulin 03/15/2013  . Td 08/11/2002   Current Meds  Medication Sig  . ibuprofen (ADVIL,MOTRIN) 600 MG tablet Take 1 tablet (600 mg total) by mouth every 6 (six) hours as needed.  . [DISCONTINUED] SPRINTEC 28 0.25-35 MG-MCG tablet Take 1 tablet by mouth daily.    Allergies: Patient is allergic to sulfamethoxazole; amlodipine; imitrex [sumatriptan]; norvasc [amlodipine besylate]; phenergan [promethazine hcl]; promethazine; sulfamethoxazole-trimethoprim; sumatriptan succinate; and doxycycline. Past Medical History Patient  has a past medical history of Anemia (07/25/2013), Anxiety, Anxiety and depression (07/25/2013), Depression, Esophageal reflux (07/25/2013), GERD (gastroesophageal reflux disease), Heart murmur, History of benign breast tumor, varicella, IBS (irritable bowel syndrome), Migraine (03/22/2015), Other and unspecified hyperlipidemia (07/25/2013), and RLS (restless legs syndrome) (05/20/2014). Past Surgical History Patient  has a past surgical history that includes Wisdom tooth extraction (32 yrs old) and Cholecystectomy (N/A, 10/25/2015). Family History: Patient family history includes Arthritis in her paternal grandmother; Cancer in her paternal grandfather and paternal grandmother; Depression in her paternal grandmother; Diabetes in her paternal grandmother; Hyperlipidemia in her father; Thyroid  disease in her mother. Social History:  Patient  reports that she has never smoked. She has never used smokeless tobacco. She reports that she does not drink alcohol or use drugs.  Review of Systems: Constitutional: negative for fever or malaise Ophthalmic: negative for photophobia, double vision or loss of vision Cardiovascular: negative for chest pain, dyspnea on exertion, or new LE swelling Respiratory: negative for SOB or persistent cough Gastrointestinal: negative for change in bowel habits or melena Genitourinary: negative for dysuria or gross hematuria Musculoskeletal: negative for new gait disturbance or muscular weakness Integumentary: negative for new or persistent rashes Neurological: negative for TIA or stroke symptoms Psychiatric: negative for SI or delusions Allergic/Immunologic: negative for hives  Patient Care Team    Relationship Specialty Notifications Start End  Leamon Arnt, MD PCP - General Family Medicine  03/10/18     Objective  Vitals: BP 104/70   Pulse 67   Temp 98.8 F (37.1 C)   Ht 5' 5.5" (1.664 m)   Wt 164 lb 6.4 oz (74.6 kg)   LMP 03/08/2018  SpO2 98%   BMI 26.94 kg/m  General:  Well developed, well nourished, no acute distress  Psych:  Alert and oriented,normal mood and affect Good insight Cardiovascular:  RRR without gallop, rub or murmur, nondisplaced PMI Respiratory:  Good breath sounds bilaterally, CTAB with normal respiratory effort Gastrointestinal: normal bowel sounds, soft, non-tender, no noted masses. No HSM Skin:  Warm, no rashes or suspicious lesions noted Neurologic:    Mental status is normal. Gross motor and sensory exams are normal. Normal gait   Commons side effects, risks, benefits, and alternatives for medications and treatment plan prescribed today were discussed, and the patient expressed understanding of the given instructions. Patient is instructed to call or message via MyChart if he/she has any questions or concerns  regarding our treatment plan. No barriers to understanding were identified. We discussed Red Flag symptoms and signs in detail. Patient expressed understanding regarding what to do in case of urgent or emergency type symptoms.   Medication list was reconciled, printed and provided to the patient in AVS. Patient instructions and summary information was reviewed with the patient as documented in the AVS. This note was prepared with assistance of Dragon voice recognition software. Occasional wrong-word or sound-a-like substitutions may have occurred due to the inherent limitations of voice recognition software

## 2018-04-28 ENCOUNTER — Encounter: Payer: Self-pay | Admitting: Family Medicine

## 2018-04-28 ENCOUNTER — Other Ambulatory Visit: Payer: Self-pay

## 2018-04-28 ENCOUNTER — Ambulatory Visit (INDEPENDENT_AMBULATORY_CARE_PROVIDER_SITE_OTHER): Payer: BLUE CROSS/BLUE SHIELD | Admitting: Family Medicine

## 2018-04-28 VITALS — BP 122/80 | HR 72 | Temp 98.2°F | Ht 65.5 in | Wt 163.6 lb

## 2018-04-28 DIAGNOSIS — Z0000 Encounter for general adult medical examination without abnormal findings: Secondary | ICD-10-CM

## 2018-04-28 DIAGNOSIS — K219 Gastro-esophageal reflux disease without esophagitis: Secondary | ICD-10-CM | POA: Diagnosis not present

## 2018-04-28 DIAGNOSIS — Z3041 Encounter for surveillance of contraceptive pills: Secondary | ICD-10-CM | POA: Diagnosis not present

## 2018-04-28 LAB — COMPREHENSIVE METABOLIC PANEL
ALBUMIN: 4.3 g/dL (ref 3.5–5.2)
ALT: 12 U/L (ref 0–35)
AST: 8 U/L (ref 0–37)
Alkaline Phosphatase: 51 U/L (ref 39–117)
BUN: 16 mg/dL (ref 6–23)
CHLORIDE: 104 meq/L (ref 96–112)
CO2: 26 mEq/L (ref 19–32)
CREATININE: 0.67 mg/dL (ref 0.40–1.20)
Calcium: 9.4 mg/dL (ref 8.4–10.5)
GFR: 109.94 mL/min (ref >60.00)
Glucose, Bld: 93 mg/dL (ref 70–99)
Potassium: 4.2 mEq/L (ref 3.5–5.1)
SODIUM: 137 meq/L (ref 135–145)
Total Bilirubin: 0.7 mg/dL (ref 0.2–1.2)
Total Protein: 7.4 g/dL (ref 6.0–8.3)

## 2018-04-28 LAB — CBC WITH DIFFERENTIAL/PLATELET
Basophils Absolute: 0.1 10*3/uL (ref 0.0–0.1)
Basophils Relative: 0.6 % (ref 0.0–3.0)
EOS ABS: 0.2 10*3/uL (ref 0.0–0.7)
Eosinophils Relative: 1.7 % (ref 0.0–5.0)
HCT: 42.2 % (ref 36.0–46.0)
HEMOGLOBIN: 14.4 g/dL (ref 12.0–15.0)
Lymphocytes Relative: 37.9 % (ref 12.0–46.0)
Lymphs Abs: 3.5 10*3/uL (ref 0.7–4.0)
MCHC: 34.2 g/dL (ref 30.0–36.0)
MCV: 88.6 fl (ref 78.0–100.0)
Monocytes Absolute: 0.4 10*3/uL (ref 0.1–1.0)
Monocytes Relative: 4.5 % (ref 3.0–12.0)
Neutro Abs: 5.2 10*3/uL (ref 1.4–7.7)
Neutrophils Relative %: 55.3 % (ref 43.0–77.0)
Platelets: 304 10*3/uL (ref 150.0–400.0)
RBC: 4.76 Mil/uL (ref 3.87–5.11)
RDW: 13.1 % (ref 11.5–15.5)
WBC: 9.3 10*3/uL (ref 4.0–10.5)

## 2018-04-28 LAB — LIPID PANEL
CHOL/HDL RATIO: 4
Cholesterol: 224 mg/dL — ABNORMAL HIGH (ref 0–200)
HDL: 59 mg/dL (ref >39.00)
LDL CALC: 141 mg/dL — AB (ref 0–99)
NONHDL: 164.6
Triglycerides: 120 mg/dL (ref 0.0–149.0)
VLDL: 24 mg/dL (ref 0.0–40.0)

## 2018-04-28 LAB — H. PYLORI ANTIBODY, IGG: H PYLORI IGG: NEGATIVE

## 2018-04-28 NOTE — Progress Notes (Signed)
Subjective  Chief Complaint  Patient presents with  . Annual Exam    doing well, no complaints, sees GYN- Dr. Lynnette Caffey  . Immunizations    had Tdap at GYN, declines flu shot today     HPI: Gina Moreno is a 32 y.o. female who presents to [facility] today for a Female Wellness Visit.   Wellness Visit: annual visit with health maintenance review and exam without Pap   32 year old female with history of GERD on Prilosec here for physical.  See last note.  Has GYN exam next month scheduled.  On Prilosec for the last 4 to 6 weeks which has helped her GERD symptoms.  However if she needs acidic food she still will have symptoms.  No melena.  Had prior GI work-up which was negative.  Otherwise feeling well.  Refuses flu shot.  Reports gets sick when she takes it.  Tdap is up-to-date.  Assessment  1. Annual physical exam   2. Gastroesophageal reflux disease, esophagitis presence not specified   3. Oral contraceptive use      Plan  Female Wellness Visit:  Age appropriate Health Maintenance and Prevention measures were discussed with patient. Included topics are cancer screening recommendations, ways to keep healthy (see AVS) including dietary and exercise recommendations, regular eye and dental care, use of seat belts, and avoidance of moderate alcohol use and tobacco use.   BMI: discussed patient's BMI and encouraged positive lifestyle modifications to help get to or maintain a target BMI.  HM needs and immunizations were addressed and ordered. See below for orders. See HM and immunization section for updates.  Declines flu shot  Routine labs and screening tests ordered including cmp, cbc and lipids where appropriate.  Discussed recommendations regarding Vit D and calcium supplementation (see AVS)  GERD: Improved control on PPI.  Check H. pylori.  Patient may need chronic medications to control her symptoms.  Avoid spicy foods as tolerated.  Follow  up: Follow-up 12 months for complete physical  Orders Placed This Encounter  Procedures  . CBC with Differential/Platelet  . Comprehensive metabolic panel  . Lipid panel  . H. pylori antibody, IgG   No orders of the defined types were placed in this encounter.     Lifestyle: Body mass index is 26.81 kg/m. Wt Readings from Last 3 Encounters:  04/28/18 163 lb 9.6 oz (74.2 kg)  03/10/18 164 lb 6.4 oz (74.6 kg)  09/02/17 168 lb 6.4 oz (76.4 kg)   Diet: general Exercise: intermittently,  Need for contraception: Yes, OCP (estrogen/progesterone)  Patient Active Problem List   Diagnosis Date Noted  . Oral contraceptive use 03/10/2018    Priority: High  . Migraine without aura and responsive to treatment 03/22/2015    Priority: High    Mild; treated with advil; associated with menses at times, monthly   . Learning disabilities 03/10/2018    Priority: Medium  . ADD (attention deficit disorder) - mild 09/02/2017    Priority: Medium    Treated in school; not treated now. Doing ok.    Marland Kitchen GERD (gastroesophageal reflux disease) 07/25/2013    Priority: Medium  . History of anxiety 07/25/2013    Priority: Medium    Has failed, Cymbalta, Paxil, Wellbutrin, Zoloft and Lexapro, Effexor, Prozac   . Hyperlipidemia, mixed 07/25/2013    Priority: Medium  . Seasonal allergic rhinitis due to pollen 03/10/2018   Health Maintenance  Topic Date Due  . INFLUENZA VACCINE  12/16/2018 (Originally 03/20/2018)  .  PAP SMEAR  07/31/2019  . TETANUS/TDAP  04/29/2027  . HIV Screening  Completed   Immunization History  Administered Date(s) Administered  . DTaP 08/16/1988, 10/09/1988, 12/07/1988, 10/17/1990, 10/15/1993  . Hepatitis B 05/14/2000, 06/18/2000, 11/13/2000  . HiB (PRP-OMP) 09/17/1989  . IPV 08/16/1988, 10/09/1988, 01/12/1994  . Influenza,inj,Quad PF,6+ Mos 07/23/2013, 05/20/2014  . MMR 09/17/1989, 10/15/1993  . Rho (D) Immune Globulin 03/15/2013  . Td 08/11/2002   We updated and  reviewed the patient's past history in detail and it is documented below. Allergies: Patient is allergic to sulfamethoxazole; amlodipine; imitrex [sumatriptan]; norvasc [amlodipine besylate]; phenergan [promethazine hcl]; promethazine; sulfamethoxazole-trimethoprim; sumatriptan succinate; and doxycycline. Past Medical History Patient  has a past medical history of Anemia (07/25/2013), Anxiety, Anxiety and depression (07/25/2013), Depression, Esophageal reflux (07/25/2013), GERD (gastroesophageal reflux disease), Heart murmur, History of benign breast tumor, varicella, IBS (irritable bowel syndrome), Migraine (03/22/2015), Other and unspecified hyperlipidemia (07/25/2013), and RLS (restless legs syndrome) (05/20/2014). Past Surgical History Patient  has a past surgical history that includes Wisdom tooth extraction (32 yrs old) and Cholecystectomy (N/A, 10/25/2015). Family History: Patient family history includes Arthritis in her paternal grandmother; Cancer in her paternal grandfather and paternal grandmother; Depression in her paternal grandmother; Diabetes in her paternal grandmother; Hyperlipidemia in her father; Thyroid disease in her mother. Social History:  Patient  reports that she has never smoked. She has never used smokeless tobacco. She reports that she does not drink alcohol or use drugs.  Review of Systems: Constitutional: negative for fever or malaise Ophthalmic: negative for photophobia, double vision or loss of vision Cardiovascular: negative for chest pain, dyspnea on exertion, or new LE swelling Respiratory: negative for SOB or persistent cough Gastrointestinal: negative for abdominal pain, change in bowel habits or melena Genitourinary: negative for dysuria or gross hematuria, no abnormal uterine bleeding or disharge Musculoskeletal: negative for new gait disturbance or muscular weakness Integumentary: negative for new or persistent rashes, no breast lumps Neurological: negative for TIA  or stroke symptoms Psychiatric: negative for SI or delusions Allergic/Immunologic: negative for hives Patient Care Team    Relationship Specialty Notifications Start End  Leamon Arnt, MD PCP - General Family Medicine  03/10/18     Objective  Vitals: BP 122/80   Pulse 72   Temp 98.2 F (36.8 C)   Ht 5' 5.5" (1.664 m)   Wt 163 lb 9.6 oz (74.2 kg)   LMP 04/05/2018   SpO2 99%   BMI 26.81 kg/m  General:  Well developed, well nourished, no acute distress  Psych:  Alert and orientedx3,normal mood and affect HEENT:  Normocephalic, atraumatic, non-icteric sclera, PERRL, oropharynx is clear without mass or exudate, supple neck without adenopathy, mass or thyromegaly Cardiovascular:  Normal S1, S2, RRR without gallop, rub or murmur, nondisplaced PMI Respiratory:  Good breath sounds bilaterally, CTAB with normal respiratory effort Gastrointestinal: normal bowel sounds, soft, minimal epigastric tenderness without rebound or guarding, no noted masses. No HSM MSK: no deformities, contusions. Joints are without erythema or swelling. Spine and CVA region are nontender Skin:  Warm, no rashes or suspicious lesions noted Neurologic:    Mental status is normal. CN 2-11 are normal. Gross motor and sensory exams are normal. Normal gait. No tremor Breast Exam: Deferred to GYN.  Commons side effects, risks, benefits, and alternatives for medications and treatment plan prescribed today were discussed, and the patient expressed understanding of the given instructions. Patient is instructed to call or message via MyChart if he/she has any questions or concerns regarding our  treatment plan. No barriers to understanding were identified. We discussed Red Flag symptoms and signs in detail. Patient expressed understanding regarding what to do in case of urgent or emergency type symptoms.   Medication list was reconciled, printed and provided to the patient in AVS. Patient instructions and summary information was  reviewed with the patient as documented in the AVS. This note was prepared with assistance of Dragon voice recognition software. Occasional wrong-word or sound-a-like substitutions may have occurred due to the inherent limitations of voice recognition software

## 2018-05-02 ENCOUNTER — Encounter: Payer: Self-pay | Admitting: Family Medicine

## 2018-05-21 ENCOUNTER — Other Ambulatory Visit: Payer: BLUE CROSS/BLUE SHIELD

## 2018-06-02 ENCOUNTER — Ambulatory Visit
Admission: RE | Admit: 2018-06-02 | Discharge: 2018-06-02 | Disposition: A | Discharge status: Home / Self Care | Payer: BLUE CROSS/BLUE SHIELD | Source: Ambulatory Visit | Attending: Obstetrics & Gynecology | Admitting: Obstetrics & Gynecology

## 2018-06-02 DIAGNOSIS — N6310 Unspecified lump in the right breast, unspecified quadrant: Secondary | ICD-10-CM

## 2018-06-17 ENCOUNTER — Ambulatory Visit: Payer: BLUE CROSS/BLUE SHIELD | Admitting: General Surgery

## 2018-06-20 ENCOUNTER — Encounter: Payer: Self-pay | Admitting: Family Medicine

## 2018-06-23 ENCOUNTER — Other Ambulatory Visit: Payer: Self-pay

## 2018-06-23 ENCOUNTER — Encounter: Payer: Self-pay | Admitting: Family Medicine

## 2018-06-23 ENCOUNTER — Ambulatory Visit: Payer: BLUE CROSS/BLUE SHIELD | Admitting: Family Medicine

## 2018-06-23 VITALS — BP 124/82 | HR 79 | Temp 97.9°F | Ht 65.5 in | Wt 171.8 lb

## 2018-06-23 DIAGNOSIS — M542 Cervicalgia: Secondary | ICD-10-CM

## 2018-06-23 MED ORDER — CYCLOBENZAPRINE HCL 10 MG PO TABS: 10.0000 mg | ORAL_TABLET | Freq: Three times a day (TID) | ORAL | 0 refills | Status: DC | PRN

## 2018-06-23 MED ORDER — DICLOFENAC SODIUM 75 MG PO TBEC: 75.0000 mg | DELAYED_RELEASE_TABLET | Freq: Two times a day (BID) | ORAL | 0 refills | Status: DC

## 2018-06-23 NOTE — Patient Instructions (Addendum)
Please follow up if symptoms do not improve or as needed.    Cervical Strain and Sprain Rehab Ask your health care provider which exercises are safe for you. Do exercises exactly as told by your health care provider and adjust them as directed. It is normal to feel mild stretching, pulling, tightness, or discomfort as you do these exercises, but you should stop right away if you feel sudden pain or your pain gets worse.Do not begin these exercises until told by your health care provider. Stretching and range of motion exercises These exercises warm up your muscles and joints and improve the movement and flexibility of your neck. These exercises also help to relieve pain, numbness, and tingling. Exercise A: Cervical side bend  1. Using good posture, sit on a stable chair or stand up. 2. Without moving your shoulders, slowly tilt your left / right ear to your shoulder until you feel a stretch in your neck muscles. You should be looking straight ahead. 3. Hold for __________ seconds. 4. Repeat with the other side of your neck. Repeat __________ times. Complete this exercise __________ times a day. Exercise B: Cervical rotation  1. Using good posture, sit on a stable chair or stand up. 2. Slowly turn your head to the side as if you are looking over your left / right shoulder. ? Keep your eyes level with the ground. ? Stop when you feel a stretch along the side and the back of your neck. 3. Hold for __________ seconds. 4. Repeat this by turning to your other side. Repeat __________ times. Complete this exercise __________ times a day. Exercise C: Thoracic extension and pectoral stretch 1. Roll a towel or a small blanket so it is about 4 inches (10 cm) in diameter. 2. Lie down on your back on a firm surface. 3. Put the towel lengthwise, under your spine in the middle of your back. It should not be not under your shoulder blades. The towel should line up with your spine from your middle back to  your lower back. 4. Put your hands behind your head and let your elbows fall out to your sides. 5. Hold for __________ seconds. Repeat __________ times. Complete this exercise __________ times a day. Strengthening exercises These exercises build strength and endurance in your neck. Endurance is the ability to use your muscles for a long time, even after your muscles get tired. Exercise D: Upper cervical flexion, isometric 1. Lie on your back with a thin pillow behind your head and a small rolled-up towel under your neck. 2. Gently tuck your chin toward your chest and nod your head down to look toward your feet. Do not lift your head off the pillow. 3. Hold for __________ seconds. 4. Release the tension slowly. Relax your neck muscles completely before you repeat this exercise. Repeat __________ times. Complete this exercise __________ times a day. Exercise E: Cervical extension, isometric  1. Stand about 6 inches (15 cm) away from a wall, with your back facing the wall. 2. Place a soft object, about 6-8 inches (15-20 cm) in diameter, between the back of your head and the wall. A soft object could be a small pillow, a ball, or a folded towel. 3. Gently tilt your head back and press into the soft object. Keep your jaw and forehead relaxed. 4. Hold for __________ seconds. 5. Release the tension slowly. Relax your neck muscles completely before you repeat this exercise. Repeat __________ times. Complete this exercise __________ times a day.  Posture and body mechanics  Body mechanics refers to the movements and positions of your body while you do your daily activities. Posture is part of body mechanics. Good posture and healthy body mechanics can help to relieve stress in your body's tissues and joints. Good posture means that your spine is in its natural S-curve position (your spine is neutral), your shoulders are pulled back slightly, and your head is not tipped forward. The following are general  guidelines for applying improved posture and body mechanics to your everyday activities. Standing  When standing, keep your spine neutral and keep your feet about hip-width apart. Keep a slight bend in your knees. Your ears, shoulders, and hips should line up.  When you do a task in which you stand in one place for a long time, place one foot up on a stable object that is 2-4 inches (5-10 cm) high, such as a footstool. This helps keep your spine neutral. Sitting   When sitting, keep your spine neutral and your keep feet flat on the floor. Use a footrest, if necessary, and keep your thighs parallel to the floor. Avoid rounding your shoulders, and avoid tilting your head forward.  When working at a desk or a computer, keep your desk at a height where your hands are slightly lower than your elbows. Slide your chair under your desk so you are close enough to maintain good posture.  When working at a computer, place your monitor at a height where you are looking straight ahead and you do not have to tilt your head forward or downward to look at the screen. Resting When lying down and resting, avoid positions that are most painful for you. Try to support your neck in a neutral position. You can use a contour pillow or a small rolled-up towel. Your pillow should support your neck but not push on it. This information is not intended to replace advice given to you by your health care provider. Make sure you discuss any questions you have with your health care provider. Document Released: 08/06/2005 Document Revised: 04/12/2016 Document Reviewed: 07/13/2015 Elsevier Interactive Patient Education  [employer].

## 2018-06-23 NOTE — Progress Notes (Signed)
Subjective  CC:  Chief Complaint  Patient presents with  . Neck Pain    x 1 month, advil works for a small amount of time, worse at night and in the morning    HPI: Gina Moreno is a 32 y.o. female who presents to the office today to address the problems listed above in the chief complaint.  Gina Moreno presents due to 4 to 5-week history of intermittent neck pain midline.  Reports that she thinks is due to sleeping wrong.  She tends to cradle her 99-month-old son at night to get him to fall asleep.  She awakens with neck pain, finding her head hyperflexed and unsupported.  She uses Advil almost daily with good relief.  She also uses her smart phone much of the day.  She denies shoulder pain, radicular symptoms, prior history of neck pain, or injury.  No headaches.  She does have a history of GERD using as needed PPIs. Assessment  1. Neck pain      Plan   Muscular skeletal neck pain: Multifactorial due to poor posture, texting and watching video on her phone, and strain from sleep positions.  Educated.  Recommend good posture, warm heat compresses, 2-week course of NSAIDs while overlapping with PPI daily, and decreased phone use.  If not getting better, patient can return.  Trial muscle relaxer at night.  Follow up: Return if symptoms worsen or fail to improve.   No orders of the defined types were placed in this encounter.  Meds ordered this encounter  Medications  . cyclobenzaprine (FLEXERIL) 10 MG tablet    Sig: Take 1 tablet (10 mg total) by mouth 3 (three) times daily as needed for muscle spasms.    Dispense:  30 tablet    Refill:  0  . diclofenac (VOLTAREN) 75 MG EC tablet    Sig: Take 1 tablet (75 mg total) by mouth 2 (two) times daily.    Dispense:  30 tablet    Refill:  0      I reviewed the patients updated PMH, FH, and SocHx.    Patient Active Problem List   Diagnosis Date Noted  . Oral contraceptive use 03/10/2018    Priority: High  . Migraine without aura and  responsive to treatment 03/22/2015    Priority: High  . Learning disabilities 03/10/2018    Priority: Medium  . 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 GERD (gastroesophageal reflux disease) 07/25/2013    Priority: Medium  . History of anxiety 07/25/2013    Priority: Medium  . Hyperlipidemia, mixed 07/25/2013    Priority: Medium  . Seasonal allergic rhinitis due to pollen 03/10/2018   Current Meds  Medication Sig  . cetirizine (ZYRTEC) 10 MG tablet Take 1 tablet (10 mg total) by mouth daily.  Lenda Kelp FE 1/20 1-20 MG-MCG tablet Take 1 tablet by mouth daily.  Marland Kitchen omeprazole (PRILOSEC) 20 MG capsule Take 1 capsule (20 mg total) by mouth daily.    Allergies: Patient is allergic to sulfamethoxazole; amlodipine; imitrex [sumatriptan]; norvasc [amlodipine besylate]; phenergan [promethazine hcl]; promethazine; sulfamethoxazole-trimethoprim; sumatriptan succinate; and doxycycline. Family History: Patient family history includes Arthritis in her paternal grandmother; Cancer in her paternal grandfather and paternal grandmother; Depression in her paternal grandmother; Diabetes in her paternal grandmother; Hyperlipidemia in her father; Thyroid disease in her mother. Social History:  Patient  reports that she has never smoked. She has never used smokeless tobacco. She reports that she does  not drink alcohol or use drugs.  Review of Systems: Constitutional: Negative for fever malaise or anorexia Cardiovascular: negative for chest pain Respiratory: negative for SOB or persistent cough Gastrointestinal: negative for abdominal pain  Objective  Vitals: BP 124/82   Pulse 79   Temp 97.9 F (36.6 C)   Ht 5' 5.5" (1.664 m)   Wt 171 lb 12.8 oz (77.9 kg)   SpO2 99%   BMI 28.15 kg/m  General: no acute distress , A&Ox3 HEENT: PEERL, conjunctiva normal, Oropharynx moist,neck is supple, nontender cervical spine, nontender traps, full range of motion of  neck    Commons side effects, risks, benefits, and alternatives for medications and treatment plan prescribed today were discussed, and the patient expressed understanding of the given instructions. Patient is instructed to call or message via MyChart if he/she has any questions or concerns regarding our treatment plan. No barriers to understanding were identified. We discussed Red Flag symptoms and signs in detail. Patient expressed understanding regarding what to do in case of urgent or emergency type symptoms.   Medication list was reconciled, printed and provided to the patient in AVS. Patient instructions and summary information was reviewed with the patient as documented in the AVS. This note was prepared with assistance of Dragon voice recognition software. Occasional wrong-word or sound-a-like substitutions may have occurred due to the inherent limitations of voice recognition software

## 2018-06-26 ENCOUNTER — Ambulatory Visit: Payer: BLUE CROSS/BLUE SHIELD | Admitting: General Surgery

## 2018-06-26 ENCOUNTER — Encounter: Payer: Self-pay | Admitting: General Surgery

## 2018-06-26 VITALS — BP 143/98 | HR 79 | Temp 97.5°F | Resp 20 | Wt 168.4 lb

## 2018-06-26 DIAGNOSIS — N6310 Unspecified lump in the right breast, unspecified quadrant: Secondary | ICD-10-CM

## 2018-06-26 DIAGNOSIS — D241 Benign neoplasm of right breast: Secondary | ICD-10-CM | POA: Insufficient documentation

## 2018-06-26 NOTE — Patient Instructions (Signed)
Breast Biopsy A breast biopsy is a procedure in which a sample of suspicious breast tissue is removed from your breast. Following the procedure, the tissue or liquid that is removed from the breast is examined under a microscope to see if cancerous cells are present. You may need a breast biopsy if you have:  Any undiagnosed breast mass (tumor).  Nipple abnormalities, dimpling, crusting, or ulcerations.  Abnormal discharge from the nipple, especially blood.  Redness, swelling, and pain of the breast.  Calcium deposits (calcifications) or abnormalities seen on a mammogram, ultrasound results, or MRI results.  Suspicious changes in the breast seen on your mammogram.  If the breast abnormality is found to be cancerous (malignant), a breast biopsy can help to determine what the best treatment is for you. There are many different types of breast biopsies. Talk with your health care provider about your options and which type is best for you. Tell a health care provider about:  Any allergies you have.  All medicines you are taking, including vitamins, herbs, eye drops, creams, and over-the-counter medicines.  Any problems you or family members have had with anesthetic medicines.  Any blood disorders you have.  Any surgeries you have had.  Any medical conditions you have.  Whether you are pregnant or may be pregnant. What are the risks? Generally, this is a safe procedure. However, problems may occur, including:  Bleeding.  Infection.  Discomfort. This is temporary.  Allergic reactions to medicines.  Bruising and swelling of the breast.  Alteration in the shape of the breast.  Damage to other tissues.  Not finding the lump or abnormality.  Needing more surgery.  What happens before the procedure?  Plan to have someone take you home after the procedure.  Do not use any tobacco products, such as cigarettes, chewing tobacco, and e-cigarettes. If you need help quitting,  ask your health care provider.  Do not drink alcohol for 24 hours before the procedure.  Ask your health care provider about: ? Changing or stopping your regular medicines. This is especially important if you are taking diabetes medicines or blood thinners. ? Taking medicines such as aspirin and ibuprofen. These medicines can thin your blood. Do not take these medicines before your procedure if your health care provider instructs you not to.  Wear a good support bra to the procedure.  Ask your health care provider how your surgical site will be marked or identified.  You may be given antibiotic medicine to help prevent infection.  Your health care provider may perform a procedure to place a wire (needle localization) or a seed that gives off radiation (radioactive seed localization) in the breast lump. A mammogram, ultrasound, MRI, or a combination of these techniques will be done during this procedure to identify the location of the breast abnormality. The imaging technique used will depend on the type of biopsy you are having. The wire or seed will help the health care provider locate the lump when performing the biopsy, especially if the lump cannot be felt. What happens during the procedure? You may be given one or both of the following:  A medicine to numb the breast area (local anesthetic).  A medicine to help you relax (sedative) during the procedure.  Surgical Biopsy This method requires an incision in the breast to remove part or all of the suspicious tissue. After the tissue is removed, the skin over the area will be closed with sutures and covered with a dressing. There are two types  of surgical biopsies:  Incisional biopsy. The surgeon will remove part of the breast lump.  Excisional biopsy. The surgeon will attempt to remove the whole breast lump or as much of it as possible.  After any of these procedures, the tissue or liquid that was removed will be examined under a  microscope. What happens after the procedure?  You will be taken to the recovery area. If you are doing well and have no problems, you will be allowed to go home.  You may notice bruising on your breast. This is normal.  You may have a pressure dressing applied on your breast for 24-48 hours. A pressure dressing is a bandage that is wrapped tightly around the chest to stop fluid from collecting underneath tissues. You may also be advised to wear a supportive bra during this time.  Do not drive for 24 hours if you received a sedative. This information is not intended to replace advice given to you by your health care provider. Make sure you discuss any questions you have with your health care provider. Document Released: 08/06/2005 Document Revised: 12/15/2015 Document Reviewed: 05/10/2015 Elsevier Interactive Patient Education  [employer].

## 2018-06-26 NOTE — Progress Notes (Signed)
[facility] History and Physical  Reason for Referral: Right breast mass  Referring Physician:  Dr. Billey Chang MD     Gina Moreno is a 32 y.o. female.  HPI: Gina Moreno is a 32 yo with a history of a right breast mass that has been ultrasound since 2016 when she first brought it to her PCP attention. Since that time she had serial US of the area, and it started to enlarge prompting a biopsy of the area in 2017. 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 The patient then found out she was pregnant, and did not get this removed. She had a repeat US that showed the area to be stable, and on her last [place] 05/2018 the area was growing slightly but the images still favored benign etiology. Given the prior biopsy and growth, she is being referred for surgical excision.  She states that she can palpate the area occassionally but denies any other breast lumps, bumps, nipple drainage, pain. She has no family history of breast cancer. She is otherwise healthy and has two children (15 months and 59 years old). 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    Past Medical History:  Diagnosis Date  . Anemia 07/25/2013  . Anxiety   . Anxiety and depression 07/25/2013  . Depression   . Esophageal reflux 07/25/2013  . GERD (gastroesophageal reflux disease)   . Heart murmur    at birth   . History of benign breast tumor   . Hx of varicella   . IBS (irritable bowel syndrome)   . Migraine 03/22/2015  . Other and unspecified hyperlipidemia 07/25/2013  . RLS (restless legs syndrome) 05/20/2014    Past Surgical History:  Procedure Laterality Date  . CHOLECYSTECTOMY N/A 10/25/2015   Procedure: LAPAROSCOPIC CHOLECYSTECTOMY;  Surgeon: Greer Pickerel, MD;  Location: [facility];  Service: General;  Laterality: N/A;  . WISDOM TOOTH EXTRACTION  32 yrs old    Family History  Problem Relation Age of Onset  . Hyperlipidemia Father   . Arthritis  Paternal Grandmother   . Cancer Paternal Grandmother        cervical  . Diabetes Paternal Grandmother   . Depression Paternal Grandmother   . Cancer Paternal Grandfather        lung stomach  . Thyroid disease Mother     Social History   Tobacco Use  . Smoking status: Never Smoker  . Smokeless tobacco: Never Used  Substance Use Topics  . Alcohol use: No  . Drug use: No    Medications: I have reviewed the patient's current medications. Allergies as of 06/26/2018      Reactions   Sulfamethoxazole Dermatitis   Amlodipine Other (See Comments)   "made feel weird"   Imitrex [sumatriptan]    Syncope   Norvasc [amlodipine Besylate] Other (See Comments)   Pt can't remember what reaction was to this drug   Phenergan [promethazine Hcl]    Uncontrollable arm movement.    Promethazine    Uncontrollable arm movement.    Sulfamethoxazole-trimethoprim Itching   Sumatriptan Succinate    Syncope   Doxycycline Rash      Medication List        Accurate as of 06/26/18  5:21 PM. Always use your most recent med list.          cetirizine 10 MG tablet Commonly known as:  ZYRTEC Take 1 tablet (10 mg total) by mouth  daily.   cyclobenzaprine 10 MG tablet Commonly known as:  FLEXERIL Take 1 tablet (10 mg total) by mouth 3 (three) times daily as needed for muscle spasms.   diclofenac 75 MG EC tablet Commonly known as:  VOLTAREN Take 1 tablet (75 mg total) by mouth 2 (two) times daily.   JUNEL FE 1/20 1-20 MG-MCG tablet Generic drug:  norethindrone-ethinyl estradiol Take 1 tablet by mouth daily.   omeprazole 20 MG capsule Commonly known as:  PRILOSEC Take 1 capsule (20 mg total) by mouth daily.        ROS:  A comprehensive review of systems was negative except for: Gastrointestinal: positive for reflux symptoms Neurological: positive for headaches  Blood pressure (!) 143/98, pulse 79, temperature (!) 97.5 F (36.4 C), temperature source Temporal, resp. rate 20, weight 168  lb 6.4 oz (76.4 kg), unknown if currently breastfeeding. Physical Exam  Constitutional: She is oriented to person, place, and time. She appears well-developed and well-nourished.  HENT:  Head: Normocephalic and atraumatic.  Eyes: Pupils are equal, round, and reactive to light. EOM are normal.  Neck: Normal range of motion.  Cardiovascular: Normal rate and regular rhythm.  Pulmonary/Chest: Effort normal and breath sounds normal. Right breast exhibits mass. Right breast exhibits no inverted nipple, no nipple discharge, no skin change and no tenderness. Left breast exhibits no inverted nipple, no mass, no nipple discharge, no skin change and no tenderness.  1X2cm mass at the 10 oclock position in the upper outer quadrant of the right breast near the areola    Abdominal: Soft. She exhibits no distension. There is no tenderness.  Musculoskeletal: Normal range of motion. She exhibits no edema.  Lymphadenopathy:    She has no axillary adenopathy.  Neurological: She is alert and oriented to person, place, and time.  Skin: Skin is warm and dry.  Psychiatric: She has a normal mood and affect. Her behavior is normal. Judgment and thought content normal.  Vitals reviewed.   Results: R breast mass 2016- 1.8 x 0.9 x 1.0 cm R breast mass 2019- 2.6 x 1.2 x 1.7 cm  Pathology 07/2016  Diagnosis Breast, right, needle core biopsy, 9 o'clock - BIPHASIC (STROMAL/EPITHELIAL) LESION. - SEE COMMENT. Microscopic Comment The differential diagnosis includes a phyllodes tumor and a fibroadenoma.   Assessment & Plan:  Gina Moreno is a 32 y.o. 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 We have discussed the difference and the spectrum of disease for phyllodes tumor. Given the growth and the possibility of phyllodes it is reasonable to do an excisional biopsy.   -Excisional biopsy of the right breast mass, this area is palpable   All questions were answered to the  satisfaction of the patient.  The risk and benefits of excisional biopsy of the right breast mass were discussed including but not limited to bleeding, infection, cosmesis and seroma formation/ possible indention, need for further surgery, risk of recurrence.  After careful consideration, Gina Moreno has decided to proceed.    Virl Cagey 06/26/2018, 5:21 PM

## 2018-07-04 ENCOUNTER — Other Ambulatory Visit: Payer: Self-pay | Admitting: Family Medicine

## 2018-07-04 NOTE — H&P (Signed)
[facility] History and Physical  Reason for Referral: Right breast mass  Referring Physician:  Dr. Billey Chang MD    Gina Moreno is a 32 y.o. female.  HPI: Gina Moreno is a 32 yo with a history of a right breast mass that has been ultrasound since 2016 when she first brought it to her PCP attention. Since that time she had serial US of the area, and it started to enlarge prompting a biopsy of the area in 2017. 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 The patient then found out she was pregnant, and did not get this removed. She had a repeat US that showed the area to be stable, and on her last [place] 05/2018 the area was growing slightly but the images still favored benign etiology. Given the prior biopsy and growth, she is being referred for surgical excision.  She states that she can palpate the area occassionally but denies any other breast lumps, bumps, nipple drainage, pain. She has no family history of breast cancer. She is otherwise healthy and has two children (15 months and 75 years old). 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        Past Medical History:  Diagnosis Date  . Anemia 07/25/2013  . Anxiety   . Anxiety and depression 07/25/2013  . Depression   . Esophageal reflux 07/25/2013  . GERD (gastroesophageal reflux disease)   . Heart murmur    at birth   . History of benign breast tumor   . Hx of varicella   . IBS (irritable bowel syndrome)   . Migraine 03/22/2015  . Other and unspecified hyperlipidemia 07/25/2013  . RLS (restless legs syndrome) 05/20/2014         Past Surgical History:  Procedure Laterality Date  . CHOLECYSTECTOMY N/A 10/25/2015   Procedure: LAPAROSCOPIC CHOLECYSTECTOMY;  Surgeon: Greer Pickerel, MD;  Location: [facility];  Service: General;  Laterality: N/A;  . WISDOM TOOTH EXTRACTION  32 yrs old         Family History  Problem Relation Age of Onset  .  Hyperlipidemia Father   . Arthritis Paternal Grandmother   . Cancer Paternal Grandmother        cervical  . Diabetes Paternal Grandmother   . Depression Paternal Grandmother   . Cancer Paternal Grandfather        lung stomach  . Thyroid disease Mother     Social History       Tobacco Use  . Smoking status: Never Smoker  . Smokeless tobacco: Never Used  Substance Use Topics  . Alcohol use: No  . Drug use: No    Medications: I have reviewed the patient's current medications.      Allergies as of 06/26/2018      Reactions   Sulfamethoxazole Dermatitis   Amlodipine Other (See Comments)   "made feel weird"   Imitrex [sumatriptan]    Syncope   Norvasc [amlodipine Besylate] Other (See Comments)   Pt can't remember what reaction was to this drug   Phenergan [promethazine Hcl]    Uncontrollable arm movement.    Promethazine    Uncontrollable arm movement.    Sulfamethoxazole-trimethoprim Itching   Sumatriptan Succinate    Syncope   Doxycycline Rash           Medication List            Accurate as of 06/26/18  5:21 PM. Always use  your most recent med list.           cetirizine 10 MG tablet Commonly known as:  ZYRTEC Take 1 tablet (10 mg total) by mouth daily.   cyclobenzaprine 10 MG tablet Commonly known as:  FLEXERIL Take 1 tablet (10 mg total) by mouth 3 (three) times daily as needed for muscle spasms.   diclofenac 75 MG EC tablet Commonly known as:  VOLTAREN Take 1 tablet (75 mg total) by mouth 2 (two) times daily.   JUNEL FE 1/20 1-20 MG-MCG tablet Generic drug:  norethindrone-ethinyl estradiol Take 1 tablet by mouth daily.   omeprazole 20 MG capsule Commonly known as:  PRILOSEC Take 1 capsule (20 mg total) by mouth daily.        ROS:  A comprehensive review of systems was negative except for: Gastrointestinal: positive for reflux symptoms Neurological: positive for headaches  Blood pressure  (!) 143/98, pulse 79, temperature (!) 97.5 F (36.4 C), temperature source Temporal, resp. rate 20, weight 168 lb 6.4 oz (76.4 kg), unknown if currently breastfeeding. Physical Exam  Constitutional: She is oriented to person, place, and time. She appears well-developed and well-nourished.  HENT:  Head: Normocephalic and atraumatic.  Eyes: Pupils are equal, round, and reactive to light. EOM are normal.  Neck: Normal range of motion.  Cardiovascular: Normal rate and regular rhythm.  Pulmonary/Chest: Effort normal and breath sounds normal. Right breast exhibits mass. Right breast exhibits no inverted nipple, no nipple discharge, no skin change and no tenderness. Left breast exhibits no inverted nipple, no mass, no nipple discharge, no skin change and no tenderness.  1X2cm mass at the 10 oclock position in the upper outer quadrant of the right breast near the areola    Abdominal: Soft. She exhibits no distension. There is no tenderness.  Musculoskeletal: Normal range of motion. She exhibits no edema.  Lymphadenopathy:    She has no axillary adenopathy.  Neurological: She is alert and oriented to person, place, and time.  Skin: Skin is warm and dry.  Psychiatric: She has a normal mood and affect. Her behavior is normal. Judgment and thought content normal.  Vitals reviewed.   Results: R breast mass 2016- 1.8 x 0.9 x 1.0 cm R breast mass 2019- 2.6 x 1.2 x 1.7 cm  Pathology 07/2016  Diagnosis Breast, right, needle core biopsy, 9 o'clock - BIPHASIC (STROMAL/EPITHELIAL) LESION. - SEE COMMENT. Microscopic Comment The differential diagnosis includes a phyllodes tumor and a fibroadenoma.   Assessment & Plan:  Gina Moreno is a 32 y.o. 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 We have discussed the difference and the spectrum of disease for phyllodes tumor. Given the growth and the possibility of phyllodes it is reasonable to do an excisional  biopsy.   -Excisional biopsy of the right breast mass, this area is palpable   All questions were answered to the satisfaction of the patient.  The risk and benefits of excisional biopsy of the right breast mass were discussed including but not limited to bleeding, infection, cosmesis and seroma formation/ possible indention, need for further surgery, risk of recurrence.  After careful consideration, Monisha A Sullivant has decided to proceed.    Virl Cagey 06/26/2018, 5:21 PM

## 2018-07-10 ENCOUNTER — Other Ambulatory Visit: Payer: Self-pay

## 2018-07-10 ENCOUNTER — Ambulatory Visit
Admission: RE | Admit: 2018-07-10 | Discharge: 2018-07-10 | Disposition: A | Discharge status: Home / Self Care | Payer: BLUE CROSS/BLUE SHIELD | Source: Ambulatory Visit | Attending: Family Medicine | Admitting: Family Medicine

## 2018-07-10 ENCOUNTER — Encounter: Payer: Self-pay | Admitting: Family Medicine

## 2018-07-10 ENCOUNTER — Other Ambulatory Visit: Payer: Self-pay | Admitting: Family Medicine

## 2018-07-10 ENCOUNTER — Ambulatory Visit: Payer: BLUE CROSS/BLUE SHIELD | Admitting: Family Medicine

## 2018-07-10 VITALS — BP 128/86 | HR 91 | Temp 97.9°F | Resp 16 | Ht 65.5 in | Wt 166.0 lb

## 2018-07-10 DIAGNOSIS — F419 Anxiety disorder, unspecified: Secondary | ICD-10-CM

## 2018-07-10 DIAGNOSIS — M542 Cervicalgia: Secondary | ICD-10-CM | POA: Diagnosis not present

## 2018-07-10 DIAGNOSIS — R109 Unspecified abdominal pain: Secondary | ICD-10-CM | POA: Diagnosis not present

## 2018-07-10 DIAGNOSIS — H938X3 Other specified disorders of ear, bilateral: Secondary | ICD-10-CM | POA: Diagnosis not present

## 2018-07-10 NOTE — Patient Instructions (Addendum)
Please follow up if symptoms do not improve or as needed.    If you have any questions or concerns, please don't hesitate to send me a message via MyChart or call the office at [phone]. Thank you for visiting with [place] today! It's our pleasure caring for you.  Start taking you klonopin as needed for your anxiety symptoms.   I have ordered physical therapy for your neck pain.   Monitor your abdominal pain to see what happens over the next day or two. Many things can causes abdominal pain. Today your vital signs and exam is normal, so I do not believe you have an emergent problem. Return to the office if it persists.

## 2018-07-10 NOTE — Progress Notes (Signed)
Subjective  CC:  Chief Complaint  Patient presents with  . Neck Pain    tension headaches, neck pain is still the same.   Marland Kitchen Anxiety  . Ear Fullness    both ears    HPI: Gina Moreno is a 32 y.o. female who presents to the office today to address the problems listed above in the chief complaint.  Gina Moreno returns for persistent neck pain.  See last visit.  She reports that NSAIDs and muscle relaxers were not helpful.  She felt very strange in the muscle relaxers.  Pain is mainly in the right upper back musculature.  However today she has multiple other concerns.  She reports that her anxiety is back.  She reports that she has had severe anxiety in her early 51s treated with multiple SSRIs.  She had been on Klonopin in the past.  She had a few leftover and took that over the last day or so and that did help.  She is worried that there is something wrong with her brain she may need an brain MRI.  She has had a history of headaches and had a normal brain MRI in the past.  She also complains of ear fullness and then this morning had acute lower abdominal pain described as crampiness.  No nausea, vomiting, diarrhea, fevers, chills, chest pain, shortness of breath.  In regard to stressors, she reports that her husband works the night shift and that she does everything to maintain the home and take care of her children.  She denies symptoms of depression at this time.   Office Visit from 06/23/2018 in [place]  PHQ-9 Total Score  0      Assessment  1. Neck pain   2. Anxiety   3. Sensation of fullness in both ears   4. Abdominal cramping      Plan   Neck pain, persistent: Exam most consistent with musculoskeletal etiology and history consistent with tension, stress induced.  Try to educate patient regarding the sling.  She seems frustrated.  Recommend physical therapy and next x-ray.  Anxiety: Counseling done.  Recommend Klonopin as needed.  Patient  to return if she feels further treatment is necessary.  Can reconsider SSRIs.  Can consider psychiatry.  Can consider counseling.  Patient was inquiring about amitriptyline, medication that her friend takes.  And concerned about the side effects and how she would tolerate it.  I declined at this time.  Fullness in the ears, normal exam.  Most likely related to allergies.  Could be related to anxiety.  There are no signs or symptoms consistent with need for brain MRI at this time.  Abdominal cramping times a few hours.  Unclear etiology.  Need to monitor symptoms, return if worsens  Follow up: Return if symptoms worsen or fail to improve.   Orders Placed This Encounter  Procedures  . DG Cervical Spine Complete  . Ambulatory referral to Physical Therapy   No orders of the defined types were placed in this encounter.     I reviewed the patients updated PMH, FH, and SocHx.    Patient Active Problem List   Diagnosis Date Noted  . Oral contraceptive use 03/10/2018    Priority: High  . Migraine without aura and responsive to treatment 03/22/2015    Priority: High  . Learning disabilities 03/10/2018    Priority: Medium  . 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  GERD (gastroesophageal reflux disease) 07/25/2013    Priority: Medium  . History of anxiety 07/25/2013    Priority: Medium  . Hyperlipidemia, mixed 07/25/2013    Priority: Medium  . Breast mass, right 06/26/2018  . Seasonal allergic rhinitis due to pollen 03/10/2018   Current Meds  Medication Sig  . cetirizine (ZYRTEC) 10 MG tablet Take 1 tablet (10 mg total) by mouth daily.  . clonazePAM (KLONOPIN) 0.5 MG tablet Take 0.25 mg by mouth 2 (two) times daily as needed for anxiety.  Lenda Kelp FE 1/20 1-20 MG-MCG tablet Take 1 tablet by mouth daily.  Marland Kitchen omeprazole (PRILOSEC) 20 MG capsule Take 1 capsule (20 mg total) by mouth daily.    Allergies: Patient is allergic to sulfamethoxazole;  amlodipine; imitrex [sumatriptan]; norvasc [amlodipine besylate]; phenergan [promethazine hcl]; promethazine; sulfamethoxazole-trimethoprim; sumatriptan succinate; and doxycycline. Family History: Patient family history includes Arthritis in her paternal grandmother; Cancer in her paternal grandfather and paternal grandmother; Depression in her paternal grandmother; Diabetes in her paternal grandmother; Hyperlipidemia in her father; Thyroid disease in her mother. Social History:  Patient  reports that she has never smoked. She has never used smokeless tobacco. She reports that she does not drink alcohol or use drugs.  Review of Systems: Constitutional: Negative for fever malaise or anorexia Cardiovascular: negative for chest pain Respiratory: negative for SOB or persistent cough Gastrointestinal: negative for abdominal pain  Objective  Vitals: BP 128/86   Pulse 91   Temp 97.9 F (36.6 C) (Oral)   Resp 16   Ht 5' 5.5" (1.664 m)   Wt 166 lb (75.3 kg)   SpO2 99%   BMI 27.20 kg/m   General: no acute distress , A&Ox3 Psych: Anxious affect, mildly pressured speech, poor insight, HEENT: PEERL, conjunctiva normal, Oropharynx moist,neck is supple, normal TMs bilaterally, right trapezius is tender, right occiput is tender Cardiovascular:  RRR without murmur or gallop.  Respiratory:  Good breath sounds bilaterally, CTAB with normal respiratory effort Gastrointestinal: soft, flat abdomen, normal active bowel sounds, no palpable masses, no hepatosplenomegaly, no appreciated hernias, mild lower abdominal tenderness without rebound or guarding Skin:  Warm, no rashes     Commons side effects, risks, benefits, and alternatives for medications and treatment plan prescribed today were discussed, and the patient expressed understanding of the given instructions. Patient is instructed to call or message via MyChart if he/she has any questions or concerns regarding our treatment plan. No barriers to  understanding were identified. We discussed Red Flag symptoms and signs in detail. Patient expressed understanding regarding what to do in case of urgent or emergency type symptoms.   Medication list was reconciled, printed and provided to the patient in AVS. Patient instructions and summary information was reviewed with the patient as documented in the AVS. This note was prepared with assistance of Dragon voice recognition software. Occasional wrong-word or sound-a-like substitutions may have occurred due to the inherent limitations of voice recognition software

## 2018-07-10 NOTE — Telephone Encounter (Signed)
Copied from [place] [id number]. Topic: Quick Communication - Rx Refill/Question >> Jul 10, 2018  2:30 PM Leward Quan A wrote: Medication: clonazePAM (KLONOPIN) 0.5 MG tablet  Has the patient contacted their pharmacy? Yes.   (Agent: If no, request that the patient contact the pharmacy for the refill.) (Agent: If yes, when and what did the pharmacy advise?)  Preferred Pharmacy (with phone number or street name): [id number] - [place], [place] - [address] [place] [address] [place] [address] [phone] (Phone) [phone] (Fax)    Agent: Please be advised that RX refills may take up to 3 business days. We ask that you follow-up with your pharmacy.

## 2018-07-11 NOTE — Telephone Encounter (Signed)
Last OV 07/10/18   Please advise

## 2018-07-12 MED ORDER — CLONAZEPAM 0.5 MG PO TABS: 0.2500 mg | ORAL_TABLET | Freq: Two times a day (BID) | ORAL | 2 refills | Status: DC | PRN

## 2018-08-15 NOTE — Patient Instructions (Signed)
Gina Moreno  08/15/2018     @PREFPERIOPPHARMACY @   Your procedure is scheduled on  09/01/2018.  Report to [facility] at  615   A.M.  Call this number if you have problems the morning of surgery:  [phone]   Remember:  Do not eat or drink after midnight.                       Take these medicines the morning of surgery with A SIP OF WATER  Zyrtec, clonazepam, prilosec.    Do not wear jewelry, make-up or nail polish.  Do not wear lotions, powders, or perfumes, or deodorant.  Do not shave 48 hours prior to surgery.  Men may shave face and neck.  Do not bring valuables to the hospital.  [facility] is not responsible for any belongings or valuables.  Contacts, dentures or bridgework may not be worn into surgery.  Leave your suitcase in the car.  After surgery it may be brought to your room.  For patients admitted to the hospital, discharge time will be determined by your treatment team.  Patients discharged the day of surgery will not be allowed to drive home.   Name and phone number of your driver:   family Special instructions:  None  Please read over the following fact sheets that you were given. Anesthesia Post-op Instructions and Care and Recovery After Surgery       Breast Biopsy A breast biopsy is a procedure in which a sample of breast tissue is removed from the breast and examined under a microscope to see if cancerous cells are present. You may need a breast biopsy if you have:  Any undiagnosed breast mass (tumor).  Nipple abnormalities, dimpling, crusting, or ulcerations.  Abnormal discharge from the nipple, especially blood.  Redness, swelling, and pain of the breast.  Calcium deposits (calcifications) or abnormalities seen on a mammogram, ultrasound results, or MRI results.  Abnormal changes in the breast seen on your mammogram. If the breast abnormality is found to be cancerous (malignant), a breast biopsy can help to determine  what the best treatment is for you. There are many different types of breast biopsies. Talk with your health care provider about your options and which type is best for you. Tell a health care provider about:  Any allergies you have.  All medicines you are taking, including vitamins, herbs, eye drops, creams, and over-the-counter medicines.  Any problems you or family members have had with anesthetic medicines.  Any blood disorders you have.  Any surgeries you have had.  Any medical conditions you have.  Whether you are pregnant or may be pregnant. What are the risks? Generally, this is a safe procedure. However, problems may occur, including:  Discomfort. This is temporary.  Bruising and swelling of the breast.  Changes in the shape of the breast.  Bleeding.  Infection.  Damage to other tissues.  Allergic reactions to medicines.  Needing more surgery. What happens before the procedure? Medicines Ask your health care provider about:  Changing or stopping your regular medicines. This is especially important if you are taking diabetes medicines or blood thinners.  Taking medicines such as aspirin and ibuprofen. These medicines can thin your blood. Do not take these medicines unless your health care provider tells you to take them.  Taking over-the-counter medicines, vitamins, herbs, and supplements. Lifestyle  Do not use any products that contain  nicotine or tobacco, such as cigarettes, e-cigarettes, and chewing tobacco. If you need help quitting, ask your health care provider.  Do not drink alcohol for 24 hours before the procedure.  Wear a good support bra to the procedure. Eating and drinking restrictions Talk to your health care provider about when you should stop eating and drinking.  You may be asked not to drink or eat for 2-8 hours before the breast biopsy.  In some cases, you may be allowed to eat a light breakfast. General instructions  Plan to have  someone take you home from the hospital or clinic.  Ask your health care provider how your surgical site will be marked or identified.  Ask your health care provider what steps will be taken to help prevent infection. These may include: ? Removing hair at the surgery site. ? Washing skin with a germ-killing soap.  Your health care provider may do a procedure to locate and mark the tumor area in your breast (localization). This will help guide your surgeon to where the biopsy or incision is made. This may be done with: ? Imaging, such as a mammogram, ultrasound, or MRI. ? Insertion of special wire, clip, seed, or radar reflector implant in the tumor area. What happens during the procedure?   You may be given one or more of the following: ? A medicine to numb the breast area (local anesthetic). ? A medicine to help you relax (sedative). ? A medicine to make you fall asleep (general anesthetic).  Your health care provider will perform the biopsy using only one of the following methods. He or she will do: ? Fine needle aspiration. A thin needle with a syringe will be inserted into a breast cyst. Fluid and cells will be removed. ? Core needle biopsy. A wide, hollow needle (core needle) will be inserted into a breast lump multiple times to remove tissue samples or cores. ? Stereotactic biopsy. X-rays and a computer will be used to locate the breast lump. The surgeon will use the X-ray images to collect several samples of tissue using a needle. ? Vacuum-assisted biopsy. A small incision will be made in your breast. A hollow needle and vacuum will be passed through the incision and into the breast tissue. The vacuum will gently draw abnormal breast tissue into the needle to remove it. ? Ultrasound-guided core needle biopsy. An ultrasound will be used to help guide the core needle to the area of the mass or abnormality. An incision will be made to insert the needle. Then tissue samples will be  removed. ? Surgical biopsy. An incision will be made in the breast to remove part or all of the abnormal tissue. After the tissue is removed, the skin over the area will be closed with sutures and covered with a dressing. There are two types of surgical biopsies:  Incisional biopsy. The surgeon will remove part of the breast lump.  Excisional biopsy. The surgeon will attempt to remove the whole breast lump or as much of it as possible. After any of these procedures, the tissue or fluid that was removed will be examined under a microscope. The procedure may vary among health care providers and hospitals. What happens after the procedure?  You will be taken to the recovery area. If you are doing well and have no problems, you will be allowed to go home.  You may have a pressure dressing applied on your breast for 24-48 hours. You may also be advised to wear a  supportive bra during this time.  Do not drive for 24 hours if you were given a sedative during your procedure. Summary  A breast biopsy is a procedure in which a sample of breast tissue is removed from the breast and examined under a microscope to see if cancerous cells are present.  This is a safe procedure, but problems can occur, including bleeding, infection, pain, and bruising.  Ask your health care provider about changing or stopping your regular medicines.  Plan to have someone take you home from the hospital or clinic. This information is not intended to replace advice given to you by your health care provider. Make sure you discuss any questions you have with your health care provider. Document Released: 08/06/2005 Document Revised: 01/22/2018 Document Reviewed: 01/22/2018 Elsevier Interactive Patient Education  2019 [place].  Breast Biopsy, Care After These instructions give you information about caring for yourself after your procedure. Your doctor may also give you more specific instructions. Call your doctor if you  have any problems or questions after your procedure. What can I expect after the procedure? After your procedure, it is common to have:  Bruising on your breast.  Numbness, tingling, or pain near your biopsy site. Follow these instructions at home: Medicines  Take over-the-counter and prescription medicines only as told by your doctor.  Do not drive for 24 hours if you were given a medicine to help you relax (sedative) during your procedure.  Do not drink alcohol while taking pain medicine.  Do not drive or use heavy machinery while taking prescription pain medicine. Biopsy site care      Follow instructions from your doctor about how to take care of your cut from surgery (incision) or your puncture area. Make sure you: ? Wash your hands with soap and water before you change your bandage (dressing). If you cannot use soap and water, use hand sanitizer. ? Change your bandage as told by your doctor. ? Leave stitches (sutures), skin glue, or skin tape (adhesive strips) in place. They may need to stay in place for 2 weeks or longer. If tape strips get loose and curl up, you may trim the loose edges. Do not remove tape strips completely unless your doctor says it is okay.  If you have stitches, keep them dry when you take a bath or a shower.  Check your cut or puncture area every day for signs of infection. Check for: ? Redness, swelling, or pain. ? Fluid or blood. ? Warmth. ? Pus or a bad smell.  Protect the biopsy area. Do not let the area get bumped. Activity  If you had a cut during your procedure, avoid activities that could pull your cut open. These include: ? Stretching. ? Reaching over your head. ? Exercise. ? Sports. ? Lifting anything that weighs more than 3 lb (1.4 kg).  Return to your normal activities as told by your doctor. Ask your doctor what activities are safe for you. Managing pain, stiffness, and swelling If told, put ice on the biopsy site to relieve  swelling:  Put ice in a plastic bag.  Place a towel between your skin and the bag.  Leave the ice on for 20 minutes, 2-3 times a day. General instructions  Continue your normal diet.  Wear a good support bra for as long as told by your doctor.  Get checked for extra fluid around your lymph nodes (lymphedema) as often as told by your doctor.  Keep all follow-up visits as  told by your doctor. This is important. Contact a doctor if:  You notice any of the following at the biopsy site: ? More redness, swelling, or pain. ? More fluid or blood coming from the site. ? The site feels warm to the touch. ? Pus or a bad smell coming from the site. ? The site breaks open after the stitches or skin tape strips have been removed.  You have a rash.  You have a fever. Get help right away if:  You have more bleeding from the biopsy site. Get help right away if bleeding is more than a small spot.  You have trouble breathing.  You have red streaks around the biopsy site. Summary  After your procedure, it is common to have bruising, numbness, tingling, or pain near the biopsy site.  Do not drive or use heavy machinery while taking prescription pain medicine.  Wear a good support bra for as long as told by your doctor.  If you had a cut during your procedure, avoid activities that may pull the cut open. Ask your doctor what activities are safe for you. This information is not intended to replace advice given to you by your health care provider. Make sure you discuss any questions you have with your health care provider. Document Released: 06/02/2009 Document Revised: 01/23/2018 Document Reviewed: 01/23/2018 Elsevier Interactive Patient Education  2019 [place] Anesthesia, Adult General anesthesia is the use of medicines to make a person "go to sleep" (unconscious) for a medical procedure. General anesthesia must be used for certain procedures, and is often recommended for  procedures that:  Last a long time.  Require you to be still or in an unusual position.  Are major and can cause blood loss. The medicines used for general anesthesia are called general anesthetics. As well as making you unconscious for a certain amount of time, these medicines:  Prevent pain.  Control your blood pressure.  Relax your muscles. Tell a health care provider about:  Any allergies you have.  All medicines you are taking, including vitamins, herbs, eye drops, creams, and over-the-counter medicines.  Any problems you or family members have had with anesthetic medicines.  Types of anesthetics you have had in the past.  Any blood disorders you have.  Any surgeries you have had.  Any medical conditions you have.  Any recent upper respiratory, chest, or ear infections.  Any history of: ? Heart or lung conditions, such as heart failure, sleep apnea, asthma, or chronic obstructive pulmonary disease (COPD). ? [employer] forces logistics/support/administrative officer. ? Depression or anxiety.  Any tobacco or drug use, including marijuana or alcohol use.  Whether you are pregnant or may be pregnant. What are the risks? Generally, this is a safe procedure. However, problems may occur, including:  Allergic reaction.  Lung and heart problems.  Inhaling food or liquid from the stomach into the lungs (aspiration).  Nerve injury.  Dental injury.  Air in the bloodstream, which can lead to stroke.  Extreme agitation or confusion (delirium) when you wake up from the anesthetic.  Waking up during your procedure and being unable to move. This is rare. These problems are more likely to develop if you are having a major surgery or if you have an advanced or serious medical condition. You can prevent some of these complications by answering all of your health care provider's questions thoroughly and by following all instructions before your procedure. General anesthesia can cause side effects,  including:  Nausea or vomiting.  A sore throat from the breathing tube.  Hoarseness.  Wheezing or coughing.  Shaking chills.  Tiredness.  Body aches.  Anxiety.  Sleepiness or drowsiness.  Confusion or agitation. What happens before the procedure? Staying hydrated Follow instructions from your health care provider about hydration, which may include:  Up to 2 hours before the procedure - you may continue to drink clear liquids, such as water, clear fruit juice, black coffee, and plain tea.  Eating and drinking restrictions Follow instructions from your health care provider about eating and drinking, which may include:  8 hours before the procedure - stop eating heavy meals or foods such as meat, fried foods, or fatty foods.  6 hours before the procedure - stop eating light meals or foods, such as toast or cereal.  6 hours before the procedure - stop drinking milk or drinks that contain milk.  2 hours before the procedure - stop drinking clear liquids. Medicines Ask your health care provider about:  Changing or stopping your regular medicines. This is especially important if you are taking diabetes medicines or blood thinners.  Taking medicines such as aspirin and ibuprofen. These medicines can thin your blood. Do not take these medicines unless your health care provider tells you to take them.  Taking over-the-counter medicines, vitamins, herbs, and supplements. Do not take these during the week before your procedure unless your health care provider approves them. General instructions  Starting 3-6 weeks before the procedure, do not use any products that contain nicotine or tobacco, such as cigarettes and e-cigarettes. If you need help quitting, ask your health care provider.  If you brush your teeth on the morning of the procedure, make sure to spit out all of the toothpaste.  Tell your health care provider if you become ill or develop a cold, cough, or fever.  If  instructed by your health care provider, bring your sleep apnea device with you on the day of your surgery (if applicable).  Ask your health care provider if you will be going home the same day, the following day, or after a longer hospital stay. ? Plan to have someone take you home from the hospital or clinic. ? Plan to have a responsible adult care for you for at least 24 hours after you leave the hospital or clinic. This is important. What happens during the procedure?   You will be given anesthetics through both of the following: ? A mask placed over your nose and mouth. ? An IV in one of your veins.  You may receive a medicine to help you relax (sedative).  After you are unconscious, a breathing tube may be inserted down your throat to help you breathe. This will be removed before you wake up.  An anesthesia specialist will stay with you throughout your procedure. He or she will: ? Keep you comfortable and safe by continuing to give you medicines and adjusting the amount of medicine that you get. ? Monitor your blood pressure, pulse, and oxygen levels to make sure that the anesthetics do not cause any problems. The procedure may vary among health care providers and hospitals. What happens after the procedure?  Your blood pressure, temperature, heart rate, breathing rate, and blood oxygen level will be monitored until the medicines you were given have worn off.  You will wake up in a recovery area. You may wake up slowly.  If you feel anxious or agitated, you may be given medicine to help you calm down.  If  you will be going home the same day, your health care provider may check to make sure you can walk, drink, and urinate.  Your health care provider will treat any pain or side effects you have before you go home.  Do not drive for 24 hours if you were given a sedative. Summary  General anesthesia is used to keep you still and prevent pain during a procedure.  It is  important to tell your health care provider about your medical history and any surgeries you have had, and previous experience with anesthesia.  Follow your health care provider's instructions about when to stop eating, drinking, or taking certain medicines before your procedure.  Plan to have someone take you home from the hospital or clinic. This information is not intended to replace advice given to you by your health care provider. Make sure you discuss any questions you have with your health care provider. Document Released: 11/13/2007 Document Revised: 12/24/2017 Document Reviewed: 03/22/2017 Elsevier Interactive Patient Education  2019 [place] Anesthesia, Adult, Care After This sheet gives you information about how to care for yourself after your procedure. Your health care provider may also give you more specific instructions. If you have problems or questions, contact your health care provider. What can I expect after the procedure? After the procedure, the following side effects are common:  Pain or discomfort at the IV site.  Nausea.  Vomiting.  Sore throat.  Trouble concentrating.  Feeling cold or chills.  Weak or tired.  Sleepiness and fatigue.  Soreness and body aches. These side effects can affect parts of the body that were not involved in surgery. Follow these instructions at home:  For at least 24 hours after the procedure:  Have a responsible adult stay with you. It is important to have someone help care for you until you are awake and alert.  Rest as needed.  Do not: ? Participate in activities in which you could fall or become injured. ? Drive. ? Use heavy machinery. ? Drink alcohol. ? Take sleeping pills or medicines that cause drowsiness. ? Make important decisions or sign legal documents. ? Take care of children on your own. Eating and drinking  Follow any instructions from your health care provider about eating or drinking  restrictions.  When you feel hungry, start by eating small amounts of foods that are soft and easy to digest (bland), such as toast. Gradually return to your regular diet.  Drink enough fluid to keep your urine pale yellow.  If you vomit, rehydrate by drinking water, juice, or clear broth. General instructions  If you have sleep apnea, surgery and certain medicines can increase your risk for breathing problems. Follow instructions from your health care provider about wearing your sleep device: ? Anytime you are sleeping, including during daytime naps. ? While taking prescription pain medicines, sleeping medicines, or medicines that make you drowsy.  Return to your normal activities as told by your health care provider. Ask your health care provider what activities are safe for you.  Take over-the-counter and prescription medicines only as told by your health care provider.  If you smoke, do not smoke without supervision.  Keep all follow-up visits as told by your health care provider. This is important. Contact a health care provider if:  You have nausea or vomiting that does not get better with medicine.  You cannot eat or drink without vomiting.  You have pain that does not get better with medicine.  You are  unable to pass urine.  You develop a skin rash.  You have a fever.  You have redness around your IV site that gets worse. Get help right away if:  You have difficulty breathing.  You have chest pain.  You have blood in your urine or stool, or you vomit blood. Summary  After the procedure, it is common to have a sore throat or nausea. It is also common to feel tired.  Have a responsible adult stay with you for the first 24 hours after general anesthesia. It is important to have someone help care for you until you are awake and alert.  When you feel hungry, start by eating small amounts of foods that are soft and easy to digest (bland), such as toast. Gradually  return to your regular diet.  Drink enough fluid to keep your urine pale yellow.  Return to your normal activities as told by your health care provider. Ask your health care provider what activities are safe for you. This information is not intended to replace advice given to you by your health care provider. Make sure you discuss any questions you have with your health care provider. Document Released: 11/12/2000 Document Revised: 03/22/2017 Document Reviewed: 03/22/2017 Elsevier Interactive Patient Education  2019 [employer].

## 2018-08-25 ENCOUNTER — Encounter (HOSPITAL_COMMUNITY): Payer: Self-pay

## 2018-08-25 ENCOUNTER — Other Ambulatory Visit: Payer: Self-pay

## 2018-08-25 ENCOUNTER — Other Ambulatory Visit (HOSPITAL_COMMUNITY): Payer: BLUE CROSS/BLUE SHIELD

## 2018-08-25 ENCOUNTER — Encounter (HOSPITAL_COMMUNITY)
Admission: RE | Admit: 2018-08-25 | Discharge: 2018-08-25 | Disposition: A | Discharge status: Home / Self Care | Payer: BLUE CROSS/BLUE SHIELD | Source: Ambulatory Visit | Attending: General Surgery | Admitting: General Surgery

## 2018-08-25 DIAGNOSIS — Z01812 Encounter for preprocedural laboratory examination: Secondary | ICD-10-CM | POA: Diagnosis not present

## 2018-08-25 LAB — HCG, SERUM, QUALITATIVE: Preg, Serum: NEGATIVE

## 2018-09-01 ENCOUNTER — Encounter (HOSPITAL_COMMUNITY)
Admission: RE | Disposition: A | Discharge status: Home / Self Care | Payer: Self-pay | Source: Home / Self Care | Attending: General Surgery

## 2018-09-01 ENCOUNTER — Ambulatory Visit (HOSPITAL_COMMUNITY): Payer: BLUE CROSS/BLUE SHIELD | Admitting: Anesthesiology

## 2018-09-01 ENCOUNTER — Encounter (HOSPITAL_COMMUNITY): Payer: Self-pay | Admitting: *Deleted

## 2018-09-01 ENCOUNTER — Ambulatory Visit (HOSPITAL_COMMUNITY)
Admission: RE | Admit: 2018-09-01 | Discharge: 2018-09-01 | Disposition: A | Discharge status: Home / Self Care | Payer: BLUE CROSS/BLUE SHIELD | Attending: General Surgery | Admitting: General Surgery

## 2018-09-01 ENCOUNTER — Other Ambulatory Visit: Payer: Self-pay

## 2018-09-01 DIAGNOSIS — Z882 Allergy status to sulfonamides status: Secondary | ICD-10-CM | POA: Diagnosis not present

## 2018-09-01 DIAGNOSIS — Z888 Allergy status to other drugs, medicaments and biological substances status: Secondary | ICD-10-CM | POA: Diagnosis not present

## 2018-09-01 DIAGNOSIS — Z79899 Other long term (current) drug therapy: Secondary | ICD-10-CM | POA: Insufficient documentation

## 2018-09-01 DIAGNOSIS — D241 Benign neoplasm of right breast: Secondary | ICD-10-CM | POA: Diagnosis not present

## 2018-09-01 DIAGNOSIS — K219 Gastro-esophageal reflux disease without esophagitis: Secondary | ICD-10-CM | POA: Diagnosis not present

## 2018-09-01 DIAGNOSIS — Z881 Allergy status to other antibiotic agents status: Secondary | ICD-10-CM | POA: Diagnosis not present

## 2018-09-01 DIAGNOSIS — F418 Other specified anxiety disorders: Secondary | ICD-10-CM | POA: Diagnosis not present

## 2018-09-01 DIAGNOSIS — Z793 Long term (current) use of hormonal contraceptives: Secondary | ICD-10-CM | POA: Insufficient documentation

## 2018-09-01 DIAGNOSIS — N6310 Unspecified lump in the right breast, unspecified quadrant: Secondary | ICD-10-CM

## 2018-09-01 DIAGNOSIS — Z791 Long term (current) use of non-steroidal anti-inflammatories (NSAID): Secondary | ICD-10-CM | POA: Insufficient documentation

## 2018-09-01 HISTORY — PX: EXCISION OF BREAST BIOPSY: SHX5822

## 2018-09-01 SURGERY — EXCISION OF BREAST BIOPSY
Anesthesia: General | Site: Breast | Laterality: Right

## 2018-09-01 MED ORDER — GLYCOPYRROLATE PF 0.2 MG/ML IJ SOSY
PREFILLED_SYRINGE | INTRAMUSCULAR | Status: AC
  Filled 2018-09-01: qty 3

## 2018-09-01 MED ORDER — DOCUSATE SODIUM 100 MG PO CAPS: 100.0000 mg | ORAL_CAPSULE | Freq: Two times a day (BID) | ORAL | 2 refills | Status: DC

## 2018-09-01 MED ORDER — 0.9 % SODIUM CHLORIDE (POUR BTL) OPTIME
TOPICAL | Status: DC | PRN
  Administered 2018-09-01: 1000 mL

## 2018-09-01 MED ORDER — DEXAMETHASONE SODIUM PHOSPHATE 4 MG/ML IJ SOLN
INTRAMUSCULAR | Status: DC | PRN
  Administered 2018-09-01: 10 mg via INTRAVENOUS

## 2018-09-01 MED ORDER — OXYCODONE HCL 5 MG PO TABS: 5.0000 mg | ORAL_TABLET | ORAL | 0 refills | Status: DC | PRN

## 2018-09-01 MED ORDER — LIDOCAINE 2% (20 MG/ML) 5 ML SYRINGE
INTRAMUSCULAR | Status: DC | PRN
  Administered 2018-09-01: 30 mg via INTRAVENOUS

## 2018-09-01 MED ORDER — LACTATED RINGERS IV SOLN
INTRAVENOUS | Status: DC
  Administered 2018-09-01: 07:00:00 via INTRAVENOUS

## 2018-09-01 MED ORDER — HYDROMORPHONE HCL 1 MG/ML IJ SOLN: 0.2500 mg | INTRAMUSCULAR | Status: DC | PRN

## 2018-09-01 MED ORDER — ONDANSETRON HCL 4 MG/2ML IJ SOLN
INTRAMUSCULAR | Status: AC
  Filled 2018-09-01: qty 2

## 2018-09-01 MED ORDER — HYDROCODONE-ACETAMINOPHEN 7.5-325 MG PO TABS: 1.0000 | ORAL_TABLET | Freq: Once | ORAL | Status: DC | PRN

## 2018-09-01 MED ORDER — GLYCOPYRROLATE PF 0.2 MG/ML IJ SOSY
PREFILLED_SYRINGE | INTRAMUSCULAR | Status: DC | PRN
  Administered 2018-09-01: .2 mg via INTRAVENOUS

## 2018-09-01 MED ORDER — FENTANYL CITRATE (PF) 100 MCG/2ML IJ SOLN
INTRAMUSCULAR | Status: AC
  Filled 2018-09-01: qty 2

## 2018-09-01 MED ORDER — ONDANSETRON HCL 4 MG/2ML IJ SOLN
INTRAMUSCULAR | Status: DC | PRN
  Administered 2018-09-01: 4 mg via INTRAVENOUS

## 2018-09-01 MED ORDER — MIDAZOLAM HCL 2 MG/2ML IJ SOLN: 0.5000 mg | Freq: Once | INTRAMUSCULAR | Status: DC | PRN

## 2018-09-01 MED ORDER — BUPIVACAINE HCL (PF) 0.5 % IJ SOLN
INTRAMUSCULAR | Status: DC | PRN
  Administered 2018-09-01: 10 mL

## 2018-09-01 MED ORDER — MIDAZOLAM HCL 2 MG/2ML IJ SOLN
INTRAMUSCULAR | Status: AC
  Filled 2018-09-01: qty 2

## 2018-09-01 MED ORDER — CHLORHEXIDINE GLUCONATE CLOTH 2 % EX PADS: 6.0000 | MEDICATED_PAD | Freq: Once | CUTANEOUS | Status: DC

## 2018-09-01 MED ORDER — FENTANYL CITRATE (PF) 100 MCG/2ML IJ SOLN
INTRAMUSCULAR | Status: DC | PRN
  Administered 2018-09-01: 25 ug via INTRAVENOUS
  Administered 2018-09-01: 50 ug via INTRAVENOUS
  Administered 2018-09-01: 25 ug via INTRAVENOUS

## 2018-09-01 MED ORDER — DEXAMETHASONE SODIUM PHOSPHATE 10 MG/ML IJ SOLN
INTRAMUSCULAR | Status: AC
  Filled 2018-09-01: qty 1

## 2018-09-01 MED ORDER — PROPOFOL 10 MG/ML IV BOLUS
INTRAVENOUS | Status: DC | PRN
  Administered 2018-09-01: 150 mg via INTRAVENOUS

## 2018-09-01 MED ORDER — CEFAZOLIN SODIUM-DEXTROSE 2-4 GM/100ML-% IV SOLN
2.0000 g | INTRAVENOUS | Status: AC
  Administered 2018-09-01: 2 g via INTRAVENOUS
  Filled 2018-09-01: qty 100

## 2018-09-01 MED ORDER — LIDOCAINE HCL (PF) 1 % IJ SOLN
INTRAMUSCULAR | Status: AC
  Filled 2018-09-01: qty 30

## 2018-09-01 MED ORDER — MIDAZOLAM HCL 5 MG/5ML IJ SOLN
INTRAMUSCULAR | Status: DC | PRN
  Administered 2018-09-01: 2 mg via INTRAVENOUS

## 2018-09-01 MED ORDER — PROPOFOL 10 MG/ML IV BOLUS
INTRAVENOUS | Status: AC
  Filled 2018-09-01: qty 40

## 2018-09-01 MED ORDER — BUPIVACAINE HCL (PF) 0.5 % IJ SOLN
INTRAMUSCULAR | Status: AC
  Filled 2018-09-01: qty 30

## 2018-09-01 SURGICAL SUPPLY — 29 items
CHLORAPREP W/TINT 26ML (MISCELLANEOUS) ×2 IMPLANT
CLOTH BEACON ORANGE TIMEOUT ST (SAFETY) ×2 IMPLANT
COVER LIGHT HANDLE STERIS (MISCELLANEOUS) ×4 IMPLANT
DECANTER SPIKE VIAL GLASS SM (MISCELLANEOUS) ×1 IMPLANT
DERMABOND ADVANCED (GAUZE/BANDAGES/DRESSINGS) ×1
DERMABOND ADVANCED .7 DNX12 (GAUZE/BANDAGES/DRESSINGS) IMPLANT
ELECT NDL TIP 2.8 STRL (NEEDLE) IMPLANT
ELECT NEEDLE TIP 2.8 STRL (NEEDLE) ×2 IMPLANT
ELECT REM PT RETURN 9FT ADLT (ELECTROSURGICAL) ×2
ELECTRODE REM PT RTRN 9FT ADLT (ELECTROSURGICAL) ×1 IMPLANT
GLOVE BIO SURGEON STRL SZ 6.5 (GLOVE) ×4 IMPLANT
GLOVE BIOGEL PI IND STRL 6.5 (GLOVE) ×1 IMPLANT
GLOVE BIOGEL PI IND STRL 7.0 (GLOVE) ×1 IMPLANT
GLOVE BIOGEL PI INDICATOR 6.5 (GLOVE) ×1
GLOVE BIOGEL PI INDICATOR 7.0 (GLOVE) ×2
GOWN STRL REUS W/TWL LRG LVL3 (GOWN DISPOSABLE) ×4 IMPLANT
KIT TURNOVER KIT A (KITS) ×2 IMPLANT
MANIFOLD NEPTUNE II (INSTRUMENTS) ×2 IMPLANT
NDL HYPO 25X1 1.5 SAFETY (NEEDLE) ×1 IMPLANT
NEEDLE HYPO 25X1 1.5 SAFETY (NEEDLE) ×2 IMPLANT
NS IRRIG 1000ML POUR BTL (IV SOLUTION) ×2 IMPLANT
PACK MINOR (CUSTOM PROCEDURE TRAY) ×2 IMPLANT
PAD ARMBOARD 7.5X6 YLW CONV (MISCELLANEOUS) ×2 IMPLANT
SET BASIN LINEN APH (SET/KITS/TRAYS/PACK) ×2 IMPLANT
SUT SILK 2 0 SH (SUTURE) ×2 IMPLANT
SUT VIC AB 3-0 SH 27 (SUTURE) ×2
SUT VIC AB 3-0 SH 27X BRD (SUTURE) ×1 IMPLANT
SUT VIC AB 4-0 PS2 27 (SUTURE) ×2 IMPLANT
SYR 10ML LL (SYRINGE) ×2 IMPLANT

## 2018-09-01 NOTE — H&P (Signed)
Gina Moreno is an 33 y.o. female.    HPI: Gina Moreno is a 33 yo with a history of a right breast mass that has been ultrasound since 2016 when she first brought it to her PCP attention. Since that time she had serial US of the area, and it started to enlarge prompting a biopsy of the area in 2017. 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 The patient then found out she was pregnant, and did not get this removed. She had a repeat US that showed the area to be stable, and on her last [place] 05/2018 the area was growing slightly but the images still favored benign etiology. Given the prior biopsy and growth, she is being referred for surgical excision. She states that she can palpate the area occassionally but denies any other breast lumps, bumps, nipple drainage, pain. She has no family history of breast cancer. She is otherwise healthy and has two children (15 months and 31 years old). 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 Anemia 07/25/2013  . Anxiety   . Anxiety and depression 07/25/2013  . Depression   . Esophageal reflux 07/25/2013  . GERD (gastroesophageal reflux disease)   . Heart murmur    at birth   . History of benign breast tumor   . Hx of varicella   . IBS (irritable bowel syndrome)   . Migraine 03/22/2015  . Other and unspecified hyperlipidemia 07/25/2013  . RLS (restless legs syndrome) 05/20/2014    Past Surgical History:  Procedure Laterality Date  . CHOLECYSTECTOMY N/A 10/25/2015   Procedure: LAPAROSCOPIC CHOLECYSTECTOMY;  Surgeon: Greer Pickerel, MD;  Location: [facility];  Service: General;  Laterality: N/A;  . WISDOM TOOTH EXTRACTION  33 yrs old    Family History  Problem Relation Age of Onset  . Hyperlipidemia Father   . Arthritis Paternal Grandmother   . Cancer Paternal Grandmother        cervical  . Diabetes Paternal Grandmother   . Depression Paternal Grandmother   .  Cancer Paternal Grandfather        lung stomach  . Thyroid disease Mother    Social History:  reports that she has never smoked. She has never used smokeless tobacco. She reports that she does not drink alcohol or use drugs.  Allergies:  Allergies  Allergen Reactions  . Sulfamethoxazole Dermatitis  . Amlodipine Other (See Comments)    "made feel weird"  . Imitrex [Sumatriptan]     Syncope  . Norvasc [Amlodipine Besylate] Other (See Comments)    Pt can't remember what reaction was to this drug  . Phenergan [Promethazine Hcl]     Uncontrollable arm movement.   . Promethazine     Uncontrollable arm movement.   . Sulfamethoxazole-Trimethoprim Itching  . Sumatriptan Succinate     Syncope  . Doxycycline Rash    Medications Prior to Admission  Medication Sig Dispense Refill  . cetirizine (ZYRTEC) 10 MG tablet Take 1 tablet (10 mg total) by mouth daily. (Patient taking differently: Take 10 mg by mouth daily as needed for allergies. ) 90 tablet 3  . clonazePAM (KLONOPIN) 0.5 MG tablet Take 0.5-1 tablets (0.25-0.5 mg total) by mouth 2 (two) times daily as needed for anxiety. 60 tablet 2  . ibuprofen (ADVIL,MOTRIN) 200 MG tablet Take 600-800 mg by mouth every 8 (eight) hours as needed for moderate pain.    Marland Kitchen  JUNEL FE 1/20 1-20 MG-MCG tablet Take 1 tablet by mouth daily.  2  . omeprazole (PRILOSEC) 20 MG capsule Take 1 capsule (20 mg total) by mouth daily. (Patient taking differently: Take 20 mg by mouth daily as needed (heartburn). ) 30 capsule 3  . cyclobenzaprine (FLEXERIL) 10 MG tablet Take 1 tablet (10 mg total) by mouth 3 (three) times daily as needed for muscle spasms. (Patient not taking: Reported on 07/10/2018) 30 tablet 0  . diclofenac (VOLTAREN) 75 MG EC tablet Take 1 tablet (75 mg total) by mouth 2 (two) times daily. (Patient not taking: Reported on 07/10/2018) 30 tablet 0    Results: R breast mass 2016-1.8 x 0.9 x 1.0 cm R breast mass 2019-2.6 x 1.2 x 1.7cm  Pathology  07/2016 Diagnosis Breast, right, needle core biopsy, 9 o'clock - BIPHASIC (STROMAL/EPITHELIAL) LESION. - SEE COMMENT. Microscopic Comment The differential diagnosis includes a phyllodes tumor and a fibroadenoma. Review of Systems  Gastrointestinal: Positive for heartburn.  All other systems reviewed and are negative.   Blood pressure 121/73, pulse 81, temperature 98.6 F (37 C), temperature source Oral, resp. rate 18, height 5\' 6"  (1.676 m), weight 76.2 kg, last menstrual period 08/23/2018, SpO2 96 %, unknown if currently breastfeeding. Physical Exam  Vitals reviewed. Constitutional: She is oriented to person, place, and time. She appears well-developed and well-nourished.  HENT:  Head: Normocephalic and atraumatic.  Eyes: Pupils are equal, round, and reactive to light.  Neck: Normal range of motion.  Cardiovascular: Normal rate and regular rhythm.  Respiratory: Effort normal.  Right breast, lateral aspect, 1x2cm palpable mass, mobile, marked  GI: Soft. She exhibits no distension. There is no abdominal tenderness.  Musculoskeletal: Normal range of motion.        General: No edema.  Neurological: She is alert and oriented to person, place, and time.  Skin: Skin is warm and dry.  Psychiatric: She has a normal mood and affect. Her behavior is normal. Judgment and thought content normal.     Assessment/Plan Ms. 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 We have discussed the difference and the spectrum of disease for phyllodes tumor. Given the growth and the possibility of phyllodes it is reasonable to do an excisional biopsy.  -Excisional biopsy of the right breast mass, this area is palpable  All questions were answered to the satisfaction of the patient.  The risk and benefits ofexcisional biopsy of the right breast masswere discussed including but not limited to bleeding, infection, cosmesis and seroma  formation/ possible indention, need for further surgery, risk of recurrence.After careful consideration, Gina A Hattonhas decided to proceed.  Virl Cagey, MD 09/01/2018, 7:23 AM  Updated H&P as have not seen patient since November.

## 2018-09-01 NOTE — Transfer of Care (Signed)
Immediate Anesthesia Transfer of Care Note  Patient: Gina Moreno  Procedure(s) Performed: EXCISIONAL OF BREAST BIOPSY (Right Breast)  Patient Location: PACU  Anesthesia Type:General  Level of Consciousness: awake, alert , oriented and patient cooperative  Airway & Oxygen Therapy: Patient Spontanous Breathing  Post-op Assessment: Report given to RN and Post -op Vital signs reviewed and stable  Post vital signs: Reviewed and stable  Last Vitals:  Vitals Value Taken Time  BP    Temp    Pulse 53 09/01/2018  8:33 AM  Resp 15 09/01/2018  8:33 AM  SpO2 94 % 09/01/2018  8:33 AM  Vitals shown include unvalidated device data.  Last Pain:  Vitals:   09/01/18 0635  TempSrc: Oral  PainSc: 0-No pain      Patients Stated Pain Goal: 5 (89/79/15 0413)  Complications: No apparent anesthesia complications

## 2018-09-01 NOTE — Op Note (Signed)
[facility] Operative Note  09/01/18  Preoperative Diagnosis: Right breast mass    Postoperative Diagnosis: Same   Procedure(s) Performed: Excisional biopsy of right breast mass 10o'clock position    Surgeon: Gina Matar. Constance Haw, MD   Assistants: No qualified resident was available    Anesthesia: General endotracheal   Anesthesiologist:  Dr. Hilaria Ota    Specimens: Right breast mass (silk short superior, silk long lateral, vicryl reapproximating external boarder)    Estimated Blood Loss: Minimal   Blood Replacement: None    Complications: None   Wound Class: Clean    Operative Indications: Gina Moreno is a 33 yo with a known history of a right breast mass that was found in 2016. She underwent an US guided biopsy and this was suspicious for a fibroadenoma versus a phyllodes tumor and surgical excision was recommended. She then became pregnant and this was not removed. She has had stable imaging and given the prior biopsy we opted for excision of the lesion to confirm no phyllodes tumor. We discussed the risk and benefits of the procedure including but not limited to bleeding, infection, risk of needing more surgery, risk of having some breast indention, and she opted to proceed.   Findings: Palpable mass about 2cm    Procedure: The patient was taken to the operating room and placed supine. General endotracheal anesthesia was induced. Intravenous antibiotics were  administered per protocol. The right breast was prepared and draped in the usual sterile fashion.   The mass was palpated at about 10 o'clock position, and a circumareolar incision was made.  This was carried down with electrocautery and flaps were created.  The palpable mass was excised in its entirety. The breast had glandular fibrosis tissue that was discrete from the mass, and this was not excised. A component of this fibrosis tissue was cut into during the excision.  I do not believe this represented  the mass but the external boarder was re-approximated on the specimen to ensure proper margins. The specimen was marked short superior and long lateral, and was sent to pathology.   Final inspection revealed acceptable hemostasis. The dermal layer was closed with interrupted 3-0 Vicryl and the space was left open to allow for seroma formation.  The skin was closed with 4-0 Monocryl and dermabond.  All counts were correct at the end of the case. The patient was awakened from anesthesia and extubate without complication.  The patient went to the PACU in stable condition.   Curlene Labrum, MD [facility] [address] [place], [place] [address] [phone] (office)

## 2018-09-01 NOTE — Discharge Instructions (Signed)
Breast Biopsy, Care After These instructions give you information about caring for yourself after your procedure. Your doctor may also give you more specific instructions. Call your doctor if you have any problems or questions after your procedure. What can I expect after the procedure? After your procedure, it is common to have:  Bruising on your breast.  Numbness, tingling, or pain near your biopsy site. Follow these instructions at home: Medicines  Take over-the-counter and prescription medicines only as told by your doctor.  Do not drive for 24 hours if you were given a medicine to help you relax (sedative) during your procedure.  Do not drink alcohol while taking pain medicine.  Do not drive or use heavy machinery while taking prescription pain medicine. Biopsy site care      Follow instructions from your doctor about how to take care of your cut from surgery (incision) or your puncture area. Make sure you: ? Wash your hands with soap and water before you change your bandage (dressing). If you cannot use soap and water, use hand sanitizer. ? Change your bandage as told by your doctor. ? Leave stitches (sutures), skin glue in place.   You may shower  Check your cut or puncture area every day for signs of infection. Check for: ? Redness, swelling, or pain. ? Fluid or blood. ? Warmth. ? Pus or a bad smell.  Protect the biopsy area. Do not let the area get bumped. Activity  If you had a cut during your procedure, avoid activities that could pull your cut open. If it pulls on the incision do not do it. Examples: ? Stretching. ? Reaching over your head. ? Exercise. ? Sports.  Return to your normal activities as tolerated.  Managing pain, stiffness, and swelling If told, put ice on the biopsy site to relieve swelling:  Put ice in a plastic bag.  Place a towel between your skin and the bag.  Leave the ice on for 20 minutes, 2-3 times a day. General  instructions  Continue your normal diet.  Wear a good support bra for as long as told by your doctor.  Keep all follow-up visits as told by your doctor. This is important. Contact a doctor if:  You notice any of the following at the biopsy site: ? More redness, swelling, or pain. ? More fluid or blood coming from the site. ? The site feels warm to the touch. ? Pus or a bad smell coming from the site. ? The site breaks open after the stitches or skin tape strips have been removed.  You have a rash.  You have a fever. Get help right away if:  You have more bleeding from the biopsy site. Get help right away if bleeding is more than a small spot.  You have trouble breathing.  You have red streaks around the biopsy site. Summary  After your procedure, it is common to have bruising, numbness, tingling, or pain near the biopsy site.  Do not drive or use heavy machinery while taking prescription pain medicine.  Wear a good support bra for as long as told by your doctor.  If you had a cut during your procedure, avoid activities that may pull the cut open. Ask your doctor what activities are safe for you. This information is not intended to replace advice given to you by your health care provider. Make sure you discuss any questions you have with your health care provider. Document Released: 06/02/2009 Document Revised: 01/23/2018 Document Reviewed:  01/23/2018 Elsevier Interactive Patient Education  2019 Elsevier Inc.   Diet/ Activity: Diet as tolerated.  Activity as tolerated.  Shower per your regular routine daily.  Do not take hot showers. Take warm showers that are less than 10 minutes. Do not pick at the dermabond glue on your incision sites.  This glue film will remain in place for 1-2 weeks and will start to peel off.  Do not place lotions or balms on your incision unless instructed to specifically by Dr. Constance Haw.   Medication: Take tylenol and ibuprofen as needed for  pain control, alternating every 4-6 hours.  Example:  Tylenol 1000mg  @ 6am, 12noon, 6pm, 82midnight (Do not exceed 4000mg  of tylenol a day). Ibuprofen 800mg  @ 9am, 3pm, 9pm, 3am (Do not exceed 3600mg  of ibuprofen a day).  Take Roxicodone for breakthrough pain every 4 hours.  Take Colace for constipation related to narcotic pain medication. If you do not have a bowel movement in 2 days, take Miralax over the counter.  Drink plenty of water to also prevent constipation.   Contact Information: If you have questions or concerns, please call our office, [phone], Monday- Thursday 8AM-5PM and Friday 8AM-12Noon.  If it is after hours or on the weekend, please call [facility] Main Number, [phone], and ask to speak to the surgeon on call for Dr. Constance Haw at [facility].

## 2018-09-01 NOTE — Anesthesia Postprocedure Evaluation (Signed)
Anesthesia Post Note  Patient: Data processing manager  Procedure(s) Performed: EXCISIONAL OF BREAST BIOPSY (Right Breast)  Patient location during evaluation: PACU Anesthesia Type: General Level of consciousness: awake and alert and oriented Pain management: pain level controlled Vital Signs Assessment: post-procedure vital signs reviewed and stable Respiratory status: spontaneous breathing Cardiovascular status: stable Postop Assessment: no apparent nausea or vomiting Anesthetic complications: no     Last Vitals:  Vitals:   09/01/18 0658 09/01/18 0830  BP: 121/73 129/61  Pulse:  97  Resp:  16  Temp:  37 C  SpO2:  99%    Last Pain:  Vitals:   09/01/18 0830  TempSrc:   PainSc: 3                  Gina Moreno

## 2018-09-01 NOTE — Anesthesia Preprocedure Evaluation (Signed)
Anesthesia Evaluation  Patient identified by MRN, date of birth, ID band Patient awake    Reviewed: Allergy & Precautions, NPO status , Patient's Chart, lab work & pertinent test results  Airway Mallampati: II  TM Distance: >3 FB Neck ROM: Full    Dental no notable dental hx. (+) Teeth Intact   Pulmonary neg pulmonary ROS,    Pulmonary exam normal breath sounds clear to auscultation       Cardiovascular Exercise Tolerance: Good negative cardio ROS Normal cardiovascular examI Rhythm:Regular Rate:Normal     Neuro/Psych  Headaches, Anxiety Depression negative psych ROS   GI/Hepatic Neg liver ROS, GERD  Medicated and Controlled,PRN GERD meds - no Sx today   Endo/Other  negative endocrine ROS  Renal/GU negative Renal ROS  negative genitourinary   Musculoskeletal negative musculoskeletal ROS (+)   Abdominal   Peds negative pediatric ROS (+)  Hematology negative hematology ROS (+) anemia ,   Anesthesia Other Findings   Reproductive/Obstetrics negative OB ROS                             Anesthesia Physical Anesthesia Plan  ASA: II  Anesthesia Plan: General   Post-op Pain Management:    Induction: Intravenous  PONV Risk Score and Plan:   Airway Management Planned: LMA  Additional Equipment:   Intra-op Plan:   Post-operative Plan:   Informed Consent: I have reviewed the patients History and Physical, chart, labs and discussed the procedure including the risks, benefits and alternatives for the proposed anesthesia with the patient or authorized representative who has indicated his/her understanding and acceptance.   Dental advisory given  Plan Discussed with: CRNA  Anesthesia Plan Comments:         Anesthesia Quick Evaluation

## 2018-09-01 NOTE — Anesthesia Procedure Notes (Signed)
Procedure Name: LMA Insertion Date/Time: 09/01/2018 7:36 AM Performed by: Andree Elk, Amy A, CRNA Pre-anesthesia Checklist: Patient identified, Emergency Drugs available, Suction available and Patient being monitored Patient Re-evaluated:Patient Re-evaluated prior to induction Oxygen Delivery Method: Circle system utilized Preoxygenation: Pre-oxygenation with 100% oxygen Induction Type: IV induction Ventilation: Mask ventilation without difficulty LMA: LMA inserted Number of attempts: 1 Placement Confirmation: positive ETCO2 and breath sounds checked- equal and bilateral Tube secured with: Tape Dental Injury: Teeth and Oropharynx as per pre-operative assessment

## 2018-09-02 ENCOUNTER — Encounter (HOSPITAL_COMMUNITY): Payer: Self-pay | Admitting: General Surgery

## 2018-09-02 ENCOUNTER — Telehealth: Payer: Self-pay | Admitting: General Surgery

## 2018-09-02 ENCOUNTER — Encounter: Payer: Self-pay | Admitting: Family Medicine

## 2018-09-02 NOTE — Telephone Encounter (Signed)
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  Curlene Labrum, MD [facility] [address] [place], [place] [address] [phone] (office)

## 2018-09-16 ENCOUNTER — Encounter: Payer: Self-pay | Admitting: General Surgery

## 2018-09-16 ENCOUNTER — Ambulatory Visit (INDEPENDENT_AMBULATORY_CARE_PROVIDER_SITE_OTHER): Payer: Self-pay | Admitting: General Surgery

## 2018-09-16 VITALS — BP 141/93 | HR 86 | Temp 97.1°F | Resp 16 | Wt 169.2 lb

## 2018-09-16 DIAGNOSIS — N6310 Unspecified lump in the right breast, unspecified quadrant: Secondary | ICD-10-CM

## 2018-09-16 NOTE — Progress Notes (Signed)
[facility] Note   HPI:  33 y.o. Female presents to clinic for post-op follow-up evaluation after excision of a fibroadenoma from her breast. Patient reports she is doing well. No complaints.  Review of Systems:  No fever or chills Tolerating diet Yeast infection+  All other review of systems: otherwise negative   Pathology: Diagnosis Breast, biopsy, right - BENIGN FIBROADENOMA.  Vital Signs:  BP (!) 141/93 (BP Location: Left Arm, Patient Position: Sitting, Cuff Size: Normal)   Pulse 86   Temp (!) 97.1 F (36.2 C) (Temporal)   Resp 16   Wt 169 lb 3.2 oz (76.7 kg)   LMP 08/23/2018 (Exact Date)   BMI 27.31 kg/m    Physical Exam:  Physical Exam Vitals signs reviewed.  Neck:     Musculoskeletal: Normal range of motion.  Cardiovascular:     Rate and Rhythm: Normal rate.  Pulmonary:     Effort: Pulmonary effort is normal.     Comments: Right lateral breast with dermabond, no erythema or drainage Neurological:     Mental Status: She is alert.      Assessment:  33 y.o. 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 No other issues. Taking diflucan for her yeast infection.  Plan:  - Follow up PRN    All of the above recommendations were discussed with the patient and all of patient's questions were answered to her expressed satisfaction.  Curlene Labrum, MD [facility] [address] [place], [place] [address] [phone] (office)

## 2018-09-16 NOTE — Patient Instructions (Signed)
Activity and diet as tolerated.    

## 2018-09-19 ENCOUNTER — Ambulatory Visit: Payer: Self-pay

## 2018-09-19 NOTE — Telephone Encounter (Signed)
Pt. Reports she is finishing up Flagyl for vaginal infection. Yesterday started breaking out in small, red raised dots. Itchy. Offered appointment in office.States she is going to [facility].  Reason for Disposition . Hives or itching  Answer Assessment - Initial Assessment Questions 1. APPEARANCE of RASH: "Describe the rash." (e.g., spots, blisters, raised areas, skin peeling, scaly)     Small red dots 2. SIZE: "How big are the spots?" (e.g., tip of pen, eraser, coin; inches, centimeters)     Tip of a pen 3. LOCATION: "Where is the rash located?"     Arms,legs,stomach 4. COLOR: "What color is the rash?" (Note: It is difficult to assess rash color in people with darker-colored skin. When this situation occurs, simply ask the caller to describe what they see.)     Red 5. ONSET: "When did the rash begin?"     Yesterday 6. FEVER: "Do you have a fever?" If so, ask: "What is your temperature, how was it measured, and when did it start?"     No 7. ITCHING: "Does the rash itch?" If so, ask: "How bad is the itch?" (Scale 1-10; or mild, moderate, severe)     Moderate 8. CAUSE: "What do you think is causing the rash?"     Flagyl 9. NEW MEDICATION: "What new medication are you taking?" (e.g., name of antibiotic) "When did you start taking this medication?".     Yes- Flagyl 10. OTHER SYMPTOMS: "Do you have any other symptoms?" (e.g., sore throat, fever, joint pain)       No 11. PREGNANCY: "Is there any chance you are pregnant?" "When was your last menstrual period?"       No  Protocols used: RASH - WIDESPREAD ON DRUGS-A-AH

## 2018-10-14 ENCOUNTER — Other Ambulatory Visit: Payer: Self-pay | Admitting: Family Medicine

## 2018-10-15 NOTE — Telephone Encounter (Signed)
Pt calling to check status. Pt states that she only has one left. Please advise

## 2018-10-15 NOTE — Telephone Encounter (Signed)
Last refill:07/12/18 #60, 2 Last OV:07/10/18

## 2018-10-15 NOTE — Telephone Encounter (Signed)
Please call patient. This was to be used only as needed. She is taking it twice daily. Needs a follow up visit in the office to discuss other medications to treat her anxiety. I have refilled for 30 days only. Thanks

## 2018-11-10 ENCOUNTER — Other Ambulatory Visit: Payer: Self-pay | Admitting: Family Medicine

## 2018-11-10 NOTE — Telephone Encounter (Signed)
Last OV 07/10/18  Clonazepam last filled 10/15/18 #60 with 0

## 2018-11-11 MED ORDER — CLONAZEPAM 0.5 MG PO TABS: 0.5000 mg | ORAL_TABLET | Freq: Two times a day (BID) | ORAL | 0 refills | Status: DC | PRN

## 2018-11-11 NOTE — Telephone Encounter (Signed)
Reviewed PMDP: Using klonopin bid monthly.   Will need office visit to discuss management of her chronic anxiety.  Please call patient: I have refilled her klonopin but this is not a long term chronic medication.  Will need ov when we can do video office visits soon.

## 2018-11-11 NOTE — Telephone Encounter (Signed)
L/M on voicemail with details

## 2018-11-17 ENCOUNTER — Encounter: Payer: Self-pay | Admitting: Family Medicine

## 2018-12-15 ENCOUNTER — Other Ambulatory Visit: Payer: Self-pay

## 2018-12-15 ENCOUNTER — Ambulatory Visit (INDEPENDENT_AMBULATORY_CARE_PROVIDER_SITE_OTHER): Payer: BLUE CROSS/BLUE SHIELD | Admitting: Family Medicine

## 2018-12-15 ENCOUNTER — Encounter: Payer: Self-pay | Admitting: Family Medicine

## 2018-12-15 VITALS — Temp 97.6°F | Resp 16 | Wt 167.0 lb

## 2018-12-15 DIAGNOSIS — F411 Generalized anxiety disorder: Secondary | ICD-10-CM

## 2018-12-15 NOTE — Patient Instructions (Signed)
Please return in September 2020 for your annual complete physical; please come fasting.   If you have any questions or concerns, please don't hesitate to send me a message via MyChart or call the office at [phone]. Thank you for visiting with us today! It's our pleasure caring for you.   

## 2018-12-15 NOTE — Progress Notes (Signed)
Virtual Visit via Video Note  SUBJECTIVE CC:  Chief Complaint  Patient presents with  . Anxiety    She is taking clonazepam as needed at night and reports taking it about 3/7 nights a week.Marland Kitchen GAD 7 today is 7.    I connected with Vadito on 12/15/18 at  1:40 PM EDT by a video enabled telemedicine application and verified that I am speaking with the correct person using two identifiers. Location patient: Home Location provider: [employer], Office Persons participating in the virtual visit: [employer], Leamon Arnt, MD Lilli Light, [place] discussed the limitations of evaluation and management by telemedicine and the availability of in person appointments. The patient expressed understanding and agreed to proceed.  HPI: Gina Moreno is a 33 y.o. female who was contacted today to address the problems listed above in the chief complaint/mood.  Patient requested refill of Klonopin.  Thus needed a follow-up for anxiety management.  Currently, she reports that she is doing very well although at night sometimes she will get an anxious feeling described as a heaviness in her chest.  She will take the 0.5 mg of Klonopin and this will resolve her symptoms.  She denies true panic attacks.  No chest pain, palpitations dizziness, lightheadedness or paresthesias.  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  She otherwise is doing well.  She feels she sleeps well.  Her mood is good, meaning she has no depressive symptoms.  She has had chronic anxiety in the past and has failed multiple mood medications.  She has no adverse effects from Klonopin. Depression screen [facility] 2/9 12/15/2018 06/23/2018 06/23/2018  Decreased Interest 0 0 0  Down, Depressed, Hopeless 0 0 0  PHQ - 2 Score 0 0 0  Altered sleeping 1 - 0  Tired, decreased energy 0  - 0  Change in appetite 0 - 0  Feeling bad or failure about yourself  0 - 0  Trouble concentrating 0 - 0  Moving slowly or fidgety/restless 0 - 0  Suicidal thoughts 0 - 0  PHQ-9 Score 1 - 0  Difficult doing work/chores Not difficult at all - -   GAD 7 : Generalized Anxiety Score 12/15/2018  Nervous, Anxious, on Edge 2  Control/stop worrying 1  Worry too much - different things 1  Trouble relaxing 0  Restless 1  Easily annoyed or irritable 2  Afraid - awful might happen 0  Total GAD 7 Score 7  Anxiety Difficulty Not difficult at all    Previously on prescription medications for mood/anxiety: has failed multiple SSRIs Therapist/counseling:has been to counseling Previous Diagnosis of psychiatric disorder: gad   ASSESSMENT 1. GAD (generalized anxiety disorder)   -General anxiety disorder which has been well controlled but is mildly active due to current pandemic stressors.  Using Klonopin as needed.  I feel this is appropriate.  Discussed risks and benefits of use.  Continue current medications as needed.  Discussed follow-up for increasing use, decreasing efficacy, increasing mood symptoms or anxiety symptoms with panic attacks.  Patient understands and agrees with care plan. I discussed the assessment and treatment plan with the patient. The patient was provided an opportunity to ask questions and all were answered. The patient agreed with the plan and demonstrated an understanding of the instructions.   The patient  was advised to call back or seek an in-person evaluation if the symptoms worsen or if the condition fails to improve as anticipated. Follow up: Return in about 5 months (around 05/17/2019) for complete physical.   No orders of the defined types were placed in this encounter.     I reviewed the patients updated PMH, FH, and SocHx.    Patient Active Problem List   Diagnosis Date Noted  . Oral contraceptive use 03/10/2018    Priority: High  . Migraine without aura and  responsive to treatment 03/22/2015    Priority: High  . Learning disabilities 03/10/2018    Priority: Medium  . 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 GERD (gastroesophageal reflux disease) 07/25/2013    Priority: Medium  . GAD (generalized anxiety disorder) 07/25/2013    Priority: Medium  . Hyperlipidemia, mixed 07/25/2013    Priority: Medium  . Fibroadenoma of breast, right 06/26/2018  . Seasonal allergic rhinitis due to pollen 03/10/2018   Current Meds  Medication Sig  . cetirizine (ZYRTEC) 10 MG tablet Take 1 tablet (10 mg total) by mouth daily. (Patient taking differently: Take 10 mg by mouth daily as needed for allergies. )  . clonazePAM (KLONOPIN) 0.5 MG tablet Take 1 tablet (0.5 mg total) by mouth 2 (two) times daily as needed for anxiety.  Lenda Kelp FE 1/20 1-20 MG-MCG tablet Take 1 tablet by mouth daily.    Allergies: Patient is allergic to sulfamethoxazole; amlodipine; imitrex [sumatriptan]; norvasc [amlodipine besylate]; phenergan [promethazine hcl]; promethazine; sulfamethoxazole-trimethoprim; sumatriptan succinate; doxycycline; and flagyl [metronidazole]. Family History: Patient family history includes Arthritis in her paternal grandmother; Cancer in her paternal grandfather and paternal grandmother; Depression in her paternal grandmother; Diabetes in her paternal grandmother; Hyperlipidemia in her father; Thyroid disease in her mother. Social History:  Patient  reports that she has never smoked. She has never used smokeless tobacco. She reports that she does not drink alcohol or use drugs.  Review of Systems: Constitutional: Negative for fever malaise or anorexia Cardiovascular: negative for chest pain Respiratory: negative for SOB or persistent cough Gastrointestinal: negative for abdominal pain  OBJECTIVE/OBSERVATIONS: General: no acute distress, well appearing, no apparent distress, well groomed Psych:  Alert and oriented x  3,normal mood, behavior, speech, dress, and thought processes.  Appears happy  Leamon Arnt, MD

## 2019-05-18 ENCOUNTER — Encounter: Payer: Self-pay | Admitting: Family Medicine

## 2019-05-18 ENCOUNTER — Ambulatory Visit (INDEPENDENT_AMBULATORY_CARE_PROVIDER_SITE_OTHER): Payer: BC Managed Care – PPO | Admitting: Family Medicine

## 2019-05-18 ENCOUNTER — Other Ambulatory Visit: Payer: Self-pay

## 2019-05-18 VITALS — BP 112/74 | HR 73 | Temp 98.1°F | Resp 16 | Ht 65.5 in | Wt 165.8 lb

## 2019-05-18 DIAGNOSIS — E782 Mixed hyperlipidemia: Secondary | ICD-10-CM

## 2019-05-18 DIAGNOSIS — R011 Cardiac murmur, unspecified: Secondary | ICD-10-CM

## 2019-05-18 DIAGNOSIS — G5603 Carpal tunnel syndrome, bilateral upper limbs: Secondary | ICD-10-CM

## 2019-05-18 DIAGNOSIS — F411 Generalized anxiety disorder: Secondary | ICD-10-CM

## 2019-05-18 DIAGNOSIS — Z3041 Encounter for surveillance of contraceptive pills: Secondary | ICD-10-CM | POA: Diagnosis not present

## 2019-05-18 DIAGNOSIS — Z0000 Encounter for general adult medical examination without abnormal findings: Secondary | ICD-10-CM

## 2019-05-18 HISTORY — DX: Cardiac murmur, unspecified: R01.1

## 2019-05-18 NOTE — Addendum Note (Signed)
Addended by: Francis Dowse T on: 05/18/2019 10:13 AM   Modules accepted: Orders

## 2019-05-18 NOTE — Progress Notes (Signed)
Subjective  Chief Complaint  Patient presents with  . Annual Exam    Fasting  . Anxiety    Stopped Klonopin in May, reports minimal problems    HPI: Gina Moreno is a 33 y.o. female who presents to [facility] [place] today for a Female Wellness Visit.  She also has the concerns and/or needs as listed above in the chief complaint. These will be addressed in addition to the Health Maintenance Visit.   Wellness Visit: annual visit with health maintenance review and exam without Pap, to have pap in December with GYN    HM: sees gyn in December for pap and breast exam. On ocps w/o problems. Doing well.  Chronic disease management visit and/or acute problem visit:  GAD: now off klonopin and managing behaviorally. Doing well!  HLD: fasting for labs today.   Declines flu shot today. Will think about it.   C/o numbness in hands awakening her at night. No pain or weakness.  Assessment  1. Annual physical exam   2. Oral contraceptive use   3. Hyperlipidemia, mixed   4. GAD (generalized anxiety disorder)      Plan  Female Wellness Visit:  Age appropriate Health Maintenance and Prevention measures were discussed with patient. Included topics are cancer screening recommendations, ways to keep healthy (see AVS) including dietary and exercise recommendations, regular eye and dental care, use of seat belts, and avoidance of moderate alcohol use and tobacco use.   BMI: discussed patient's BMI and encouraged positive lifestyle modifications to help get to or maintain a target BMI.  HM needs and immunizations were addressed and ordered. See below for orders. See HM and immunization section for updates.  Routine labs and screening tests ordered including cmp, cbc and lipids where appropriate.  Discussed recommendations regarding Vit D and calcium supplementation (see AVS)  Chronic disease f/u and/or acute problem visit: (deemed necessary to be done in addition to the  wellness visit):  GAD:  Stable. No meds except prn klonopin.   Continue ocps.   CTS: mild, educated. Wrist spints at night. See avs.   Follow up: Return in about 1 year (around 05/17/2020) for complete physical.   Orders Placed This Encounter  Procedures  . CBC with Differential/Platelet  . Comprehensive metabolic panel  . Lipid panel  . TSH   No orders of the defined types were placed in this encounter.     Lifestyle: Body mass index is 27.17 kg/m. Wt Readings from Last 3 Encounters:  05/18/19 165 lb 12.8 oz (75.2 kg)  12/15/18 167 lb (75.8 kg)  09/16/18 169 lb 3.2 oz (76.7 kg)    Patient Active Problem List   Diagnosis Date Noted  . Oral contraceptive use 03/10/2018    Priority: High  . Migraine without aura and responsive to treatment 03/22/2015    Priority: High    Mild; treated with advil; associated with menses at times, monthly   . Learning disabilities 03/10/2018    Priority: Medium  . ADD (attention deficit disorder) - mild 09/02/2017    Priority: Medium    Treated in school; not treated now. Doing ok.    Marland Kitchen GERD (gastroesophageal reflux disease) 07/25/2013    Priority: Medium  . GAD (generalized anxiety disorder) 07/25/2013    Priority: Medium    Has failed, Cymbalta, Paxil, Wellbutrin, Zoloft and Lexapro, Effexor, Prozac   . Hyperlipidemia, mixed 07/25/2013    Priority: Medium  . Fibroadenoma of breast, right 06/26/2018  .  Seasonal allergic rhinitis due to pollen 03/10/2018   Health Maintenance  Topic Date Due  . INFLUENZA VACCINE  11/18/2019 (Originally 03/21/2019)  . PAP SMEAR-Modifier  07/31/2019  . TETANUS/TDAP  04/29/2027  . HIV Screening  Completed   Immunization History  Administered Date(s) Administered  . DTaP 08/16/1988, 10/09/1988, 12/07/1988, 10/17/1990, 10/15/1993  . HPV Quadrivalent 01/19/2007  . Hepatitis B 05/14/2000, 06/18/2000, 11/13/2000  . HiB (PRP-OMP) 09/17/1989  . IPV 08/16/1988, 10/09/1988, 01/12/1994  .  Influenza,inj,Quad PF,6+ Mos 07/23/2013, 05/20/2014  . MMR 09/17/1989, 10/15/1993  . Rho (D) Immune Globulin 03/15/2013  . Td 08/11/2002   We updated and reviewed the patient's past history in detail and it is documented below. Allergies: Patient  reports no history of alcohol use. Past Medical History Patient  has a past medical history of Anemia (07/25/2013), Anxiety, Anxiety and depression (07/25/2013), Depression, Esophageal reflux (07/25/2013), GERD (gastroesophageal reflux disease), Heart murmur, History of benign breast tumor, varicella, IBS (irritable bowel syndrome), Migraine (03/22/2015), Other and unspecified hyperlipidemia (07/25/2013), and RLS (restless legs syndrome) (05/20/2014). Past Surgical History Patient  has a past surgical history that includes Wisdom tooth extraction (33 yrs old); Cholecystectomy (N/A, 10/25/2015); and Excision of breast biopsy (Right, 09/01/2018). Social History   Socioeconomic History  . Marital status: Married    Spouse name: Not on file  . Number of children: 2  . Years of education: Not on file  . Highest education level: Not on file  Occupational History  . Not on file  Social Needs  . Financial resource strain: Not on file  . Food insecurity    Worry: Not on file    Inability: Not on file  . Transportation needs    Medical: Not on file    Non-medical: Not on file  Tobacco Use  . Smoking status: Never Smoker  . Smokeless tobacco: Never Used  Substance and Sexual Activity  . Alcohol use: No  . Drug use: No  . Sexual activity: Yes    Partners: Male    Birth control/protection: Pill  Lifestyle  . Physical activity    Days per week: Not on file    Minutes per session: Not on file  . Stress: Not on file  Relationships  . Social Herbalist on phone: Not on file    Gets together: Not on file    Attends religious service: Not on file    Active member of club or organization: Not on file    Attends meetings of clubs or  organizations: Not on file    Relationship status: Not on file  Other Topics Concern  . Not on file  Social History Narrative  . Not on file   Family History  Problem Relation Age of Onset  . Hyperlipidemia Father   . Arthritis Paternal Grandmother   . Cancer Paternal Grandmother        cervical  . Diabetes Paternal Grandmother   . Depression Paternal Grandmother   . Cancer Paternal Grandfather        lung stomach  . Thyroid disease Mother     Review of Systems: Constitutional: negative for fever or malaise Ophthalmic: negative for photophobia, double vision or loss of vision Cardiovascular: negative for chest pain, dyspnea on exertion, or new LE swelling Respiratory: negative for SOB or persistent cough Gastrointestinal: negative for abdominal pain, change in bowel habits or melena Genitourinary: negative for dysuria or gross hematuria, no abnormal uterine bleeding or disharge Musculoskeletal: negative for new gait disturbance  or muscular weakness Integumentary: negative for new or persistent rashes, no breast lumps Neurological: negative for TIA or stroke symptoms Psychiatric: negative for SI or delusions Allergic/Immunologic: negative for hives  Patient Care Team    Relationship Specialty Notifications Start End  Leamon Arnt, MD PCP - General Family Medicine  03/10/18     Objective  Vitals: BP 112/74   Pulse 73   Temp 98.1 F (36.7 C) (Tympanic)   Resp 16   Ht 5' 5.5" (1.664 m)   Wt 165 lb 12.8 oz (75.2 kg)   LMP 05/03/2019   SpO2 99%   BMI 27.17 kg/m  General:  Well developed, well nourished, no acute distress  Psych:  Alert and orientedx3,normal mood and affect HEENT:  Normocephalic, atraumatic, non-icteric sclera, PERRL, oropharynx is clear without mass or exudate, supple neck without adenopathy, mass or thyromegaly Cardiovascular:  Normal S1, S2, RRR without gallop, rub or murmur, nondisplaced PMI Respiratory:  Good breath sounds bilaterally, CTAB with  normal respiratory effort Gastrointestinal: normal bowel sounds, soft, non-tender, no noted masses. No HSM MSK: no deformities, contusions. Joints are without erythema or swelling. Spine and CVA region are nontender. + phalen's on right.  Skin:  Warm, no rashes or suspicious lesions noted Neurologic:    Mental status is normal. CN 2-11 are normal. Gross motor and sensory exams are normal. Normal gait. No tremor   Commons side effects, risks, benefits, and alternatives for medications and treatment plan prescribed today were discussed, and the patient expressed understanding of the given instructions. Patient is instructed to call or message via MyChart if he/she has any questions or concerns regarding our treatment plan. No barriers to understanding were identified. We discussed Red Flag symptoms and signs in detail. Patient expressed understanding regarding what to do in case of urgent or emergency type symptoms.   Medication list was reconciled, printed and provided to the patient in AVS. Patient instructions and summary information was reviewed with the patient as documented in the AVS. This note was prepared with assistance of Dragon voice recognition software. Occasional wrong-word or sound-a-like substitutions may have occurred due to the inherent limitations of voice recognition software

## 2019-05-18 NOTE — Patient Instructions (Addendum)
Please return in 12 months for your annual complete physical; please come fasting. Consider getting your flu shot this season.  Glad you are doing well!  If you have any questions or concerns, please don't hesitate to send me a message via MyChart or call the office at [phone]. Thank you for visiting with Gina Moreno today! It's our pleasure caring for you.  Please do these things to maintain good health!   Exercise at least 30-45 minutes a day,  4-5 days a week.   Eat a low-fat diet with lots of fruits and vegetables, up to 7-9 servings per day.  Drink plenty of water daily. Try to drink 8 8oz glasses per day.  Seatbelts can save your life. Always wear your seatbelt.  Place Smoke Detectors on every level of your home and check batteries every year.  Schedule an appointment with an eye doctor for an eye exam every 1-2 years  Safe sex - use condoms to protect yourself from STDs if you could be exposed to these types of infections. Use birth control if you do not want to become pregnant and are sexually active.  Avoid heavy alcohol use. If you drink, keep it to less than 2 drinks/day and not every day.  [place].  Choose someone you trust that could speak for you if you became unable to speak for yourself.  Depression is common in our stressful world.If you're feeling down or losing interest in things you normally enjoy, please come in for a visit.  If anyone is threatening or hurting you, please get help. Physical or Emotional Violence is never OK.    Carpal Tunnel Syndrome  Carpal tunnel syndrome is a condition that causes pain in your hand and arm. The carpal tunnel is a narrow area located on the palm side of your wrist. Repeated wrist motion or certain diseases may cause swelling within the tunnel. This swelling pinches the main nerve in the wrist (median nerve). What are the causes? This condition may be caused by:  Repeated wrist motions.  Wrist  injuries.  Arthritis.  A cyst or tumor in the carpal tunnel.  Fluid buildup during pregnancy. Sometimes the cause of this condition is not known. What increases the risk? The following factors may make you more likely to develop this condition:  Having a job, such as being a Research scientist (life sciences), that requires you to repeatedly move your wrist in the same motion.  Being a woman.  Having certain conditions, such as: ? Diabetes. ? Obesity. ? An underactive thyroid (hypothyroidism). ? Kidney failure. What are the signs or symptoms? Symptoms of this condition include:  A tingling feeling in your fingers, especially in your thumb, index, and middle fingers.  Tingling or numbness in your hand.  An aching feeling in your entire arm, especially when your wrist and elbow are bent for a long time.  Wrist pain that goes up your arm to your shoulder.  Pain that goes down into your palm or fingers.  A weak feeling in your hands. You may have trouble grabbing and holding items. Your symptoms may feel worse during the night. How is this diagnosed? This condition is diagnosed with a medical history and physical exam. You may also have tests, including:  Electromyogram (EMG). This test measures electrical signals sent by your nerves into the muscles.  Nerve conduction study. This test measures how well electrical signals pass through your nerves.  Imaging tests, such as X-rays, ultrasound, and  MRI. These tests check for possible causes of your condition. How is this treated? This condition may be treated with:  Lifestyle changes. It is important to stop or change the activity that caused your condition.  Doing exercise and activities to strengthen your muscles and bones (physical therapy).  Learning how to use your hand again after diagnosis (occupational therapy).  Medicines for pain and inflammation. This may include medicine that is injected into your wrist.  A wrist  splint.  Surgery. Follow these instructions at home: If you have a splint:  Wear the splint as told by your health care provider. Remove it only as told by your health care provider.  Loosen the splint if your fingers tingle, become numb, or turn cold and blue.  Keep the splint clean.  If the splint is not waterproof: ? Do not let it get wet. ? Cover it with a watertight covering when you take a bath or shower. Managing pain, stiffness, and swelling   If directed, put ice on the painful area: ? If you have a removable splint, remove it as told by your health care provider. ? Put ice in a plastic bag. ? Place a towel between your skin and the bag. ? Leave the ice on for 20 minutes, 2-3 times per day. General instructions  Take over-the-counter and prescription medicines only as told by your health care provider.  Rest your wrist from any activity that may be causing your pain. If your condition is work related, talk with your employer about changes that can be made, such as getting a wrist pad to use while typing.  Do any exercises as told by your health care provider, physical therapist, or occupational therapist.  Keep all follow-up visits as told by your health care provider. This is important. Contact a health care provider if:  You have new symptoms.  Your pain is not controlled with medicines.  Your symptoms get worse. Get help right away if:  You have severe numbness or tingling in your wrist or hand. Summary  Carpal tunnel syndrome is a condition that causes pain in your hand and arm.  It is usually caused by repeated wrist motions.  Lifestyle changes and medicines are used to treat carpal tunnel syndrome. Surgery may be recommended.  Follow your health care provider's instructions about wearing a splint, resting from activity, keeping follow-up visits, and calling for help. This information is not intended to replace advice given to you by your health care  provider. Make sure you discuss any questions you have with your health care provider. Document Released: 08/03/2000 Document Revised: 12/13/2017 Document Reviewed: 12/13/2017 Elsevier Patient Education  2020 [employer].

## 2019-05-19 LAB — CBC WITH DIFFERENTIAL/PLATELET
Absolute Monocytes: 373 cells/uL (ref 200–950)
Basophils Absolute: 27 cells/uL (ref 0–200)
Basophils Relative: 0.3 %
Eosinophils Absolute: 137 cells/uL (ref 15–500)
Eosinophils Relative: 1.5 %
HCT: 41 % (ref 35.0–45.0)
Hemoglobin: 13.7 g/dL (ref 11.7–15.5)
Lymphs Abs: 3094 cells/uL (ref 850–3900)
MCH: 29.9 pg (ref 27.0–33.0)
MCHC: 33.4 g/dL (ref 32.0–36.0)
MCV: 89.5 fL (ref 80.0–100.0)
MPV: 11.4 fL (ref 7.5–12.5)
Monocytes Relative: 4.1 %
Neutro Abs: 5469 cells/uL (ref 1500–7800)
Neutrophils Relative %: 60.1 %
Platelets: 272 10*3/uL (ref 140–400)
RBC: 4.58 10*6/uL (ref 3.80–5.10)
RDW: 11.9 % (ref 11.0–15.0)
Total Lymphocyte: 34 %
WBC: 9.1 10*3/uL (ref 3.8–10.8)

## 2019-05-19 LAB — COMPREHENSIVE METABOLIC PANEL
AG Ratio: 1.4 (calc) (ref 1.0–2.5)
ALT: 13 U/L (ref 6–29)
AST: 11 U/L (ref 10–30)
Albumin: 4 g/dL (ref 3.6–5.1)
Alkaline phosphatase (APISO): 49 U/L (ref 31–125)
BUN: 12 mg/dL (ref 7–25)
CO2: 24 mmol/L (ref 20–32)
Calcium: 9 mg/dL (ref 8.6–10.2)
Chloride: 106 mmol/L (ref 98–110)
Creat: 0.73 mg/dL (ref 0.50–1.10)
Globulin: 2.9 g/dL (calc) (ref 1.9–3.7)
Glucose, Bld: 94 mg/dL (ref 65–99)
Potassium: 4 mmol/L (ref 3.5–5.3)
Sodium: 139 mmol/L (ref 135–146)
Total Bilirubin: 0.8 mg/dL (ref 0.2–1.2)
Total Protein: 6.9 g/dL (ref 6.1–8.1)

## 2019-05-19 LAB — TSH: TSH: 1.34 mIU/L

## 2019-05-19 LAB — LIPID PANEL
Cholesterol: 212 mg/dL — ABNORMAL HIGH (ref <200)
HDL: 58 mg/dL (ref >=50)
LDL Cholesterol (Calc): 129 mg/dL (calc) — ABNORMAL HIGH
Non-HDL Cholesterol (Calc): 154 mg/dL (calc) — ABNORMAL HIGH (ref <130)
Total CHOL/HDL Ratio: 3.7 (calc) (ref <5.0)
Triglycerides: 135 mg/dL (ref <150)

## 2019-06-16 ENCOUNTER — Encounter: Payer: Self-pay | Admitting: Family Medicine

## 2019-06-17 ENCOUNTER — Encounter: Payer: Self-pay | Admitting: Family Medicine

## 2019-06-17 ENCOUNTER — Ambulatory Visit (INDEPENDENT_AMBULATORY_CARE_PROVIDER_SITE_OTHER): Payer: BC Managed Care – PPO | Admitting: Family Medicine

## 2019-06-17 DIAGNOSIS — F411 Generalized anxiety disorder: Secondary | ICD-10-CM | POA: Diagnosis not present

## 2019-06-17 MED ORDER — BUSPIRONE HCL 7.5 MG PO TABS: 7.5000 mg | ORAL_TABLET | Freq: Two times a day (BID) | ORAL | 2 refills | Status: DC | PRN

## 2019-06-17 NOTE — Progress Notes (Signed)
Virtual Visit via Video Note  SUBJECTIVE CC:  Chief Complaint  Patient presents with  . Anxiety    Increased anxiety.Marland Kitchen GAD 7 score is 13 today    I connected with Pearley A Martes on 06/17/19 at  2:20 PM EDT by a video enabled telemedicine application and verified that I am speaking with the correct person using two identifiers. Location patient: Home Location provider:  [facility] [place] participating in the virtual visit: Jet, Loraine, MD Lilli Light, [place] discussed the limitations of evaluation and management by telemedicine and the availability of in person appointments. The patient expressed understanding and agreed to proceed.  HPI: Gina Moreno is a 33 y.o. female who was contacted today to address the problems listed above in the chief complaint/mood.  H/o reactive anxiety; in past treated with multiple ssri's but failed many. Was on klonopin prn last year. Most recently has done well w/o meds. But over last several weeks, anxiety sxs back in part due to home stressors she doesn't want to elaborate on. Poor sleep and increased irritability are two worst sxs. Husband is now on buspar for anxiety and she says it has helped him. No panic attacks. Has seen therapist years ago.  Depression screen [facility] 2/9 05/18/2019 12/15/2018 06/23/2018  Decreased Interest 0 0 0  Down, Depressed, Hopeless 0 0 0  PHQ - 2 Score 0 0 0  Altered sleeping - 1 -  Tired, decreased energy - 0 -  Change in appetite - 0 -  Feeling bad or failure about yourself  - 0 -  Trouble concentrating - 0 -  Moving slowly or fidgety/restless - 0 -  Suicidal thoughts - 0 -  PHQ-9 Score - 1 -  Difficult doing work/chores - Not difficult at all -   GAD 7 : Generalized Anxiety Score 06/17/2019 12/15/2018  Nervous, Anxious, on Edge 2 2  Control/stop worrying 1 1  Worry too much - different things 2 1  Trouble relaxing 2 0  Restless 2 1  Easily annoyed or  irritable 2 2  Afraid - awful might happen 2 0  Total GAD 7 Score 13 7  Anxiety Difficulty Not difficult at all Not difficult at all    ASSESSMENT 1. GAD (generalized anxiety disorder)      GAD:  Counseling done. Consider counseling again to help manage/cope with life stressors. Trial of buspar. Educated on appropriate use and expectations. F/u 4 weeks   I discussed the assessment and treatment plan with the patient. The patient was provided an opportunity to ask questions and all were answered. The patient agreed with the plan and demonstrated an understanding of the instructions.   The patient was advised to call back or seek an in-person evaluation if the symptoms worsen or if the condition fails to improve as anticipated. Follow up: 4 weeks to recheck anxiety  Visit date not found  Meds ordered this encounter  Medications  . busPIRone (BUSPAR) 7.5 MG tablet    Sig: Take 1 tablet (7.5 mg total) by mouth 2 (two) times daily as needed.    Dispense:  60 tablet    Refill:  2      I reviewed the patients updated PMH, FH, and SocHx.    Patient Active Problem List   Diagnosis Date Noted  . Oral contraceptive use 03/10/2018    Priority: High  . Migraine without aura and responsive to treatment  03/22/2015    Priority: High  . Learning disabilities 03/10/2018    Priority: Medium  . 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 GERD (gastroesophageal reflux disease) 07/25/2013    Priority: Medium  . GAD (generalized anxiety disorder) 07/25/2013    Priority: Medium  . Hyperlipidemia, mixed 07/25/2013    Priority: Medium  . Flow murmur 05/18/2019    Priority: Low  . Fibroadenoma of breast, right 06/26/2018  . Seasonal allergic rhinitis due to pollen 03/10/2018   Current Meds  Medication Sig  . cetirizine (ZYRTEC) 10 MG tablet Take 1 tablet (10 mg total) by mouth daily. (Patient taking differently: Take 10 mg by mouth daily as needed for  allergies. )  . JUNEL FE 1/20 1-20 MG-MCG tablet Take 1 tablet by mouth daily.  . Melatonin 1 MG CAPS Take by mouth.    Allergies: Patient is allergic to sulfamethoxazole; amlodipine; imitrex [sumatriptan]; norvasc [amlodipine besylate]; phenergan [promethazine hcl]; promethazine; sulfamethoxazole-trimethoprim; sumatriptan succinate; doxycycline; and flagyl [metronidazole]. Family History: Patient family history includes Arthritis in her paternal grandmother; Cancer in her paternal grandfather and paternal grandmother; Depression in her paternal grandmother; Diabetes in her paternal grandmother; Hyperlipidemia in her father; Thyroid disease in her mother. Social History:  Patient  reports that she has never smoked. She has never used smokeless tobacco. She reports that she does not drink alcohol or use drugs.  Review of Systems: Constitutional: Negative for fever malaise or anorexia Cardiovascular: negative for chest pain Respiratory: negative for SOB or persistent cough Gastrointestinal: negative for abdominal pain  OBJECTIVE/OBSERVATIONS: General: no acute distress, well appearing, no apparent distress, well groomed Psych:  Alert and oriented x 3,normal mood, behavior, speech, dress, and thought processes.   Leamon Arnt, MD

## 2019-07-16 ENCOUNTER — Other Ambulatory Visit: Payer: Self-pay | Admitting: Family Medicine

## 2019-10-17 ENCOUNTER — Other Ambulatory Visit: Payer: Self-pay | Admitting: Family Medicine

## 2019-10-19 MED ORDER — BUSPIRONE HCL 7.5 MG PO TABS: 7.5000 mg | ORAL_TABLET | Freq: Two times a day (BID) | ORAL | 3 refills | Status: DC | PRN

## 2019-10-19 NOTE — Addendum Note (Signed)
Addended by: Billey Chang on: 10/19/2019 11:57 AM   Modules accepted: Orders

## 2020-11-08 ENCOUNTER — Telehealth: Payer: Self-pay

## 2020-11-08 NOTE — Telephone Encounter (Signed)
PCP aware. Patient is going to ER, waiting on her mother to pick her up.

## 2020-11-08 NOTE — Telephone Encounter (Signed)
Nurse Assessment Nurse: Raenette Rover, RN, Zella Ball Date/Time Eilene Ghazi Time): 11/08/2020 12:28:50 PM Confirm and document reason for call. If symptomatic, describe symptoms. ---Office is transferring pt with diarrhea, nausea and abd pain since FRI. Still urinating. Vomited a little bit this am. Does the patient have any new or worsening symptoms? ---Yes Will a triage be completed? ---Yes Related visit to physician within the last 2 weeks? ---No Does the PT have any chronic conditions? (i.e. diabetes, asthma, this includes High risk factors for pregnancy, etc.) ---No Is the patient pregnant or possibly pregnant? (Ask all females between the ages of 47-55) ---No Is this a behavioral health or substance abuse call? ---No Guidelines Guideline Title Affirmed Question Affirmed Notes Nurse Date/Time (Eastern Time) Chest Pain [1] Chest pain (or "angina") comes and goes AND [2] is happening more often (increasing in frequency) or getting worse (increasing in severity) (Exception: chest pains that last only a few seconds) Raenette Rover, RN, Zella Ball 11/08/2020 12:29:31 PM Disp. Time Eilene Ghazi Time) Disposition Final User PLEASE NOTE: All timestamps contained within this report are represented as [place] Standard Time. CONFIDENTIALTY NOTICE: This fax transmission is intended only for the addressee. It contains information that is legally privileged, confidential or otherwise protected from use or disclosure. If you are not the intended recipient, you are strictly prohibited from reviewing, disclosing, copying using or disseminating any of this information or taking any action in reliance on or regarding this information. If you have received this fax in error, please notify us immediately by telephone so that we can arrange for its return to [place]. Phone: [phone], Toll-Free: [phone], Fax: [phone] Page: 2 of 2 Call Id: [id number] 11/08/2020 12:39:06 PM Go to ED Now Yes Raenette Rover, RN, Herbert Deaner  Disagree/Comply Comply Caller Understands Yes PreDisposition Did not know what to do Care Advice Given Per Guideline GO TO ED NOW: ANOTHER ADULT SHOULD DRIVE: * Leave now. Drive carefully. NOTHING BY MOUTH: * Do not eat or drink anything for now. * Severe difficulty breathing occurs * Passes out or becomes too weak to stand * You become worse CALL EMS IF: CARE ADVICE given per Chest Pain (Adult) guideline. Comments User: Wilson Singer, RN Date/Time Eilene Ghazi Time): 11/08/2020 12:31:16 PM Had 9 episodes of diarrhea on Friday, belching and continues with the diarrhea. Had 6 episodes today. User: Wilson Singer, RN Date/Time Eilene Ghazi Time): 11/08/2020 12:32:58 PM Had gall bladder removed years ago. User: Wilson Singer, RN Date/Time Eilene Ghazi Time): 11/08/2020 12:35:44 PM Rates pain 3-4/1

## 2021-06-14 ENCOUNTER — Telehealth: Payer: Self-pay

## 2021-06-14 NOTE — Telephone Encounter (Signed)
error 

## 2021-06-28 ENCOUNTER — Other Ambulatory Visit: Payer: Self-pay

## 2021-06-28 ENCOUNTER — Ambulatory Visit: Payer: BC Managed Care – PPO | Admitting: Family Medicine

## 2021-06-28 ENCOUNTER — Encounter: Payer: Self-pay | Admitting: Family Medicine

## 2021-06-28 VITALS — BP 130/80 | HR 77 | Temp 98.1°F | Ht 66.0 in | Wt 184.6 lb

## 2021-06-28 DIAGNOSIS — G8929 Other chronic pain: Secondary | ICD-10-CM

## 2021-06-28 DIAGNOSIS — F411 Generalized anxiety disorder: Secondary | ICD-10-CM | POA: Diagnosis not present

## 2021-06-28 DIAGNOSIS — M545 Low back pain, unspecified: Secondary | ICD-10-CM

## 2021-06-28 MED ORDER — BUSPIRONE HCL 7.5 MG PO TABS: 7.5000 mg | ORAL_TABLET | Freq: Every evening | ORAL | 3 refills | Status: DC

## 2021-06-28 MED ORDER — FLUOXETINE HCL 20 MG PO TABS: ORAL_TABLET | ORAL | 2 refills | Status: DC

## 2021-06-28 NOTE — Progress Notes (Signed)
Subjective  CC:  Chief Complaint  Patient presents with   Anxiety   Back Pain    Lower back, ongoing for a few months intermittent    HPI: Gina Moreno is a 35 y.o. female who presents to the office today to address the problems listed above in the chief complaint, mood problems. GAD: active again x 6 months. Last visit 2020: started bid buspar and did well until months ago. Can't tolerate during day due to lightheadedness.  She feels it is no longer working.  She takes at night.  Stressors are improved however she does reveal that her husband is a recovered addict and she tends to often worry about relapse.  This continues to stress her.  She has increased irritability.  Denies depressive symptoms.  He has noted in chart and problem list, she is failed multiple SSRIs in the past.  However her husband is now on Prozac.  Is working well for him, and she would like to try it.  She does not recall being on Prozac but she had told me in the past that she was.  She was given this years ago.  She is willing to really try it. Right-sided low back pain.  She worries that it is her kidneys.  He has intermittent mechanical back pain described as pain with certain movements.  No radicular symptoms.  No GI symptoms.  No swelling.  No fevers or chills.  She takes Tylenol and Advil relief.  It started when she was at the beach last summer.  She thought maybe it was due to getting in and out of the low beach chairs.  No leg symptoms.  No bowel or bladder dysfunction.  Depression screen [facility] 2/9 06/28/2021 05/18/2019 12/15/2018  Decreased Interest 0 0 0  Down, Depressed, Hopeless 0 0 0  PHQ - 2 Score 0 0 0  Altered sleeping - - 1  Tired, decreased energy - - 0  Change in appetite - - 0  Feeling bad or failure about yourself  - - 0  Trouble concentrating - - 0  Moving slowly or fidgety/restless - - 0  Suicidal thoughts - - 0  PHQ-9 Score - - 1  Difficult doing work/chores - - Not difficult at all   GAD  7 : Generalized Anxiety Score 06/28/2021 06/17/2019 12/15/2018  Nervous, Anxious, on Edge 3 2 2   Control/stop worrying 1 1 1   Worry too much - different things 2 2 1   Trouble relaxing 1 2 0  Restless 0 2 1  Easily annoyed or irritable 3 2 2   Afraid - awful might happen 1 2 0  Total GAD 7 Score 11 13 7   Anxiety Difficulty Somewhat difficult Not difficult at all Not difficult at all     Assessment  1. GAD (generalized anxiety disorder)   2. Chronic left-sided low back pain without sciatica      Plan  GAD: Counseling education done.  Recommend therapy to discuss anxiety around husband's addiction.  Patient defers for now.  Continue BuSpar at night.  She may want to stop this.  Trial of Prozac.  Recheck 6 weeks Chronic left-sided low back pain: Musculoskeletal.  Reassured.  Advil for 7 to 14 days and back exercises.  See after visit summary.  Follow up: Recommend complete physical at her convenience, recheck mood in 6 weeks No orders of the defined types were placed in this encounter.  Meds ordered this encounter  Medications   FLUoxetine (PROZAC)  20 MG tablet    Sig: Take 0.5 tablets (10 mg total) by mouth daily for 14 days, THEN 1 tablet (20 mg total) daily for 14 days.    Dispense:  30 tablet    Refill:  2   busPIRone (BUSPAR) 7.5 MG tablet    Sig: Take 1 tablet (7.5 mg total) by mouth at bedtime.    Dispense:  180 tablet    Refill:  3       I reviewed the patients updated PMH, FH, and SocHx.    Patient Active Problem List   Diagnosis Date Noted   Oral contraceptive use 03/10/2018    Priority: High   Migraine without aura and responsive to treatment 03/22/2015    Priority: High   Learning disabilities 03/10/2018    Priority: Medium    ADD (attention deficit disorder) - mild 09/02/2017    Priority: Medium    GERD (gastroesophageal reflux disease) 07/25/2013    Priority: Medium    GAD (generalized anxiety disorder) 07/25/2013    Priority: Medium    Hyperlipidemia,  mixed 07/25/2013    Priority: Medium    Flow murmur 05/18/2019    Priority: Low   Fibroadenoma of breast, right 06/26/2018   Seasonal allergic rhinitis due to pollen 03/10/2018   Current Meds  Medication Sig   FLUoxetine (PROZAC) 20 MG tablet Take 0.5 tablets (10 mg total) by mouth daily for 14 days, THEN 1 tablet (20 mg total) daily for 14 days.   JUNEL FE 1/20 1-20 MG-MCG tablet Take 1 tablet by mouth daily.   [DISCONTINUED] busPIRone (BUSPAR) 7.5 MG tablet Take 1 tablet (7.5 mg total) by mouth 2 (two) times daily as needed.    Allergies: Patient is allergic to sulfamethoxazole, amlodipine, imitrex [sumatriptan], norvasc [amlodipine besylate], phenergan [promethazine hcl], promethazine, sulfamethoxazole-trimethoprim, sumatriptan succinate, doxycycline, and flagyl [metronidazole]. Family history:  Patient family history includes Arthritis in her paternal grandmother; Cancer in her paternal grandfather and paternal grandmother; Depression in her paternal grandmother; Diabetes in her paternal grandmother; Hyperlipidemia in her father; Thyroid disease in her mother. Social History   Socioeconomic History   Marital status: Married    Spouse name: Not on file   Number of children: 2   Years of education: Not on file   Highest education level: Not on file  Occupational History   Not on file  Tobacco Use   Smoking status: Never   Smokeless tobacco: Never  Vaping Use   Vaping Use: Never used  Substance and Sexual Activity   Alcohol use: No   Drug use: No   Sexual activity: Yes    Partners: Male    Birth control/protection: Pill  Other Topics Concern   Not on file  Social History Narrative   Not on file   Social Determinants of Health   Financial Resource Strain: Not on file  Food Insecurity: Not on file  Transportation Needs: Not on file  Physical Activity: Not on file  Stress: Not on file  Social Connections: Not on file     Review of Systems: Constitutional:  Negative for fever malaise or anorexia Cardiovascular: negative for chest pain Respiratory: negative for SOB or persistent cough Gastrointestinal: negative for abdominal pain  Objective  Vitals: BP 130/80   Pulse 77   Temp 98.1 F (36.7 C) (Temporal)   Ht 5\' 6"  (1.676 m)   Wt 184 lb 9.6 oz (83.7 kg)   LMP 06/24/2021 (Exact Date)   SpO2 100%   Breastfeeding No  BMI 29.80 kg/m  General: no acute distress, well appearing, no apparent distress, well groomed Psych:  Alert and oriented x 3,normal mood, behavior, speech, dress, and thought processes.  Fairly good insight Neck: Normal gait.  Moves easily.  Nontender right lower back.  Commons side effects, risks, benefits, and alternatives for medications and treatment plan prescribed today were discussed, and the patient expressed understanding of the given instructions. Patient is instructed to call or message via MyChart if he/she has any questions or concerns regarding our treatment plan. No barriers to understanding were identified. We discussed Red Flag symptoms and signs in detail. Patient expressed understanding regarding what to do in case of urgent or emergency type symptoms.  Medication list was reconciled, printed and provided to the patient in AVS. Patient instructions and summary information was reviewed with the patient as documented in the AVS. This note was prepared with assistance of Dragon voice recognition software. Occasional wrong-word or sound-a-like substitutions may have occurred due to the inherent limitations of voice recognition software

## 2021-06-28 NOTE — Patient Instructions (Signed)
Please return in 6 weeks for mood follow up; can be virtual or in person. Also can schedule next available CPE (3 months or so)  Start advil 2 pill twice a day for 7-14 days to help your back pain.   If you have any questions or concerns, please don't hesitate to send me a message via MyChart or call the office at [phone]. Thank you for visiting with [place] today! It's our pleasure caring for you.   Back Exercises The following exercises strengthen the muscles that help to support the trunk (torso) and back. They also help to keep the lower back flexible. Doing these exercises can help to prevent or lessen existing low back pain. If you have back pain or discomfort, try doing these exercises 2-3 times each day or as told by your health care provider. As your pain improves, do them once each day, but increase the number of times that you repeat the steps for each exercise (do more repetitions). To prevent the recurrence of back pain, continue to do these exercises once each day or as told by your health care provider. Do exercises exactly as told by your health care provider and adjust them as directed. It is normal to feel mild stretching, pulling, tightness, or discomfort as you do these exercises, but you should stop right away if you feel sudden pain or your pain gets worse. Exercises Single knee to chest Repeat these steps 3-5 times for each leg: Lie on your back on a firm bed or the floor with your legs extended. Bring one knee to your chest. Your other leg should stay extended and in contact with the floor. Hold your knee in place by grabbing your knee or thigh with both hands and hold. Pull on your knee until you feel a gentle stretch in your lower back or buttocks. Hold the stretch for 10-30 seconds. Slowly release and straighten your leg.  Pelvic tilt Repeat these steps 5-10 times: Lie on your back on a firm bed or the floor with your legs extended. Bend your knees so they are  pointing toward the ceiling and your feet are flat on the floor. Tighten your lower abdominal muscles to press your lower back against the floor. This motion will tilt your pelvis so your tailbone points up toward the ceiling instead of pointing to your feet or the floor. With gentle tension and even breathing, hold this position for 5-10 seconds.  Cat-cow Repeat these steps until your lower back becomes more flexible: Get into a hands-and-knees position on a firm bed or the floor. Keep your hands under your shoulders, and keep your knees under your hips. You may place padding under your knees for comfort. Let your head hang down toward your chest. Contract your abdominal muscles and point your tailbone toward the floor so your lower back becomes rounded like the back of a cat. Hold this position for 5 seconds. Slowly lift your head, let your abdominal muscles relax, and point your tailbone up toward the ceiling so your back forms a sagging arch like the back of a cow. Hold this position for 5 seconds.  Press-ups Repeat these steps 5-10 times: Lie on your abdomen (face-down) on a firm bed or the floor. Place your palms near your head, about shoulder-width apart. Keeping your back as relaxed as possible and keeping your hips on the floor, slowly straighten your arms to raise the top half of your body and lift your shoulders. Do not use your  back muscles to raise your upper torso. You may adjust the placement of your hands to make yourself more comfortable. Hold this position for 5 seconds while you keep your back relaxed. Slowly return to lying flat on the floor.  Bridges Repeat these steps 10 times: Lie on your back on a firm bed or the floor. Bend your knees so they are pointing toward the ceiling and your feet are flat on the floor. Your arms should be flat at your sides, next to your body. Tighten your buttocks muscles and lift your buttocks off the floor until your waist is at almost the  same height as your knees. You should feel the muscles working in your buttocks and the back of your thighs. If you do not feel these muscles, slide your feet 1-2 inches (2.5-5 cm) farther away from your buttocks. Hold this position for 3-5 seconds. Slowly lower your hips to the starting position, and allow your buttocks muscles to relax completely. If this exercise is too easy, try doing it with your arms crossed over your chest. Abdominal crunches Repeat these steps 5-10 times: Lie on your back on a firm bed or the floor with your legs extended. Bend your knees so they are pointing toward the ceiling and your feet are flat on the floor. Cross your arms over your chest. Tip your chin slightly toward your chest without bending your neck. Tighten your abdominal muscles and slowly raise your torso high enough to lift your shoulder blades a tiny bit off the floor. Avoid raising your torso higher than that because it can put too much stress on your lower back and does not help to strengthen your abdominal muscles. Slowly return to your starting position.  Back lifts Repeat these steps 5-10 times: Lie on your abdomen (face-down) with your arms at your sides, and rest your forehead on the floor. Tighten the muscles in your legs and your buttocks. Slowly lift your chest off the floor while you keep your hips pressed to the floor. Keep the back of your head in line with the curve in your back. Your eyes should be looking at the floor. Hold this position for 3-5 seconds. Slowly return to your starting position.  Contact a health care provider if: Your back pain or discomfort gets much worse when you do an exercise. Your worsening back pain or discomfort does not lessen within 2 hours after you exercise. If you have any of these problems, stop doing these exercises right away. Do not do them again unless your health care provider says that you can. Get help right away if: You develop sudden, severe  back pain. If this happens, stop doing the exercises right away. Do not do them again unless your health care provider says that you can. This information is not intended to replace advice given to you by your health care provider. Make sure you discuss any questions you have with your health care provider. Document Revised: 01/31/2021 Document Reviewed: 10/19/2020 Elsevier Patient Education  [employer].

## 2021-07-18 ENCOUNTER — Other Ambulatory Visit: Payer: Self-pay | Admitting: Family Medicine

## 2021-07-18 NOTE — Telephone Encounter (Signed)
Pt requesting Rx for Prozac be 90 day supply. Rx is written to take 10 mg x 14 days and then 20 mg x 14 days. Is pt suppose to continue with 20 mg daily? If so okay for 90 day supply?

## 2021-08-09 ENCOUNTER — Encounter: Payer: Self-pay | Admitting: Family Medicine

## 2021-08-09 ENCOUNTER — Telehealth (INDEPENDENT_AMBULATORY_CARE_PROVIDER_SITE_OTHER): Payer: BC Managed Care – PPO | Admitting: Family Medicine

## 2021-08-09 ENCOUNTER — Other Ambulatory Visit: Payer: Self-pay

## 2021-08-09 DIAGNOSIS — F411 Generalized anxiety disorder: Secondary | ICD-10-CM

## 2021-08-09 DIAGNOSIS — K580 Irritable bowel syndrome with diarrhea: Secondary | ICD-10-CM | POA: Insufficient documentation

## 2021-08-09 NOTE — Progress Notes (Signed)
Virtual Visit via Video Note  SUBJECTIVE CC:  Chief Complaint  Patient presents with   Anxiety    Moods have been great    I connected with Gina Moreno on 08/09/21 at  9:30 AM EST by a video enabled telemedicine application and verified that I am speaking with the correct person using two identifiers. Location patient: Home Location provider: [facility] [place] participating in the virtual visit: [place], [place], MD Vassie Moselle, [place] discussed the limitations of evaluation and management by telemedicine and the availability of in person appointments. The patient expressed understanding and agreed to proceed.  HPI: Gina Moreno is a 35 y.o. female who was contacted today to address the problems listed above in the chief complaint/mood. 35 year old here for anxiety follow-up.  We started Prozac 6 weeks ago.  Refer to last note.  Fortunately, she is done extremely well.  She said after 2 weeks she started feeling better and now her mood is back to normal.  No longer having any worry, anxiety, panic or side effects from the medications.  No longer using BuSpar.  See screen below.  Depression screen [facility] 2/9 06/28/2021 05/18/2019 12/15/2018  Decreased Interest 0 0 0  Down, Depressed, Hopeless 0 0 0  PHQ - 2 Score 0 0 0  Altered sleeping - - 1  Tired, decreased energy - - 0  Change in appetite - - 0  Feeling bad or failure about yourself  - - 0  Trouble concentrating - - 0  Moving slowly or fidgety/restless - - 0  Suicidal thoughts - - 0  PHQ-9 Score - - 1  Difficult doing work/chores - - Not difficult at all   GAD 7 : Generalized Anxiety Score 08/09/2021 06/28/2021 06/17/2019 12/15/2018  Nervous, Anxious, on Edge 0 3 2 2   Control/stop worrying 0 1 1 1   Worry too much - different things 0 2 2 1   Trouble relaxing 0 1 2 0  Restless 0 0 2 1  Easily annoyed or irritable 0 3 2 2   Afraid - awful might happen 0 1 2 0  Total GAD 7  Score 0 11 13 7   Anxiety Difficulty Not difficult at all Somewhat difficult Not difficult at all Not difficult at all     ASSESSMENT 1. GAD (generalized anxiety disorder)   2. Irritable bowel syndrome with diarrhea     General anxiety disorder: Now very well controlled.  She had a very positive response.  We will monitor her symptoms over the next 3 months.  She has follow-up for complete physical in February.  We will adjust medicines at that time if indicated, for now continue Prozac 20 mg daily.  I discussed the assessment and treatment plan with the patient. The patient was provided an opportunity to ask questions and all were answered. The patient agreed with the plan and demonstrated an understanding of the instructions.   The patient was advised to call back or seek an in-person evaluation if the symptoms worsen or if the condition fails to improve as anticipated. Follow up: Return for as scheduled, complete physical.  10/04/2021  No orders of the defined types were placed in this encounter.     I reviewed the patients updated PMH, FH, and SocHx.    Patient Active Problem List   Diagnosis Date Noted   Oral contraceptive use 03/10/2018    Priority: High   Migraine without aura  and responsive to treatment 03/22/2015    Priority: High   Irritable bowel syndrome with diarrhea 08/09/2021    Priority: Medium    Learning disabilities 03/10/2018    Priority: Medium    ADD (attention deficit disorder) - mild 09/02/2017    Priority: Medium    GERD (gastroesophageal reflux disease) 07/25/2013    Priority: Medium    GAD (generalized anxiety disorder) 07/25/2013    Priority: Medium    Hyperlipidemia, mixed 07/25/2013    Priority: Medium    Flow murmur 05/18/2019    Priority: Low   Fibroadenoma of breast, right 06/26/2018   Seasonal allergic rhinitis due to pollen 03/10/2018   Current Meds  Medication Sig   FLUoxetine (PROZAC) 20 MG tablet Take 1 tablet (20 mg total) by mouth  daily.   JUNEL FE 1/20 1-20 MG-MCG tablet Take 1 tablet by mouth daily.    Allergies: Patient is allergic to sulfamethoxazole, amlodipine, imitrex [sumatriptan], norvasc [amlodipine besylate], phenergan [promethazine hcl], promethazine, sulfamethoxazole-trimethoprim, sumatriptan succinate, doxycycline, and flagyl [metronidazole]. Family History: Patient family history includes Arthritis in her paternal grandmother; Cancer in her paternal grandfather and paternal grandmother; Depression in her paternal grandmother; Diabetes in her paternal grandmother; Hyperlipidemia in her father; Thyroid disease in her mother. Social History:  Patient  reports that she has never smoked. She has never used smokeless tobacco. She reports that she does not drink alcohol and does not use drugs.  Review of Systems: Constitutional: Negative for fever malaise or anorexia Cardiovascular: negative for chest pain Respiratory: negative for SOB or persistent cough Gastrointestinal: negative for abdominal pain  OBJECTIVE/OBSERVATIONS: General: no acute distress, well appearing, no apparent distress, well groomed Psych:  Alert and oriented x 3,normal mood, behavior, speech, dress, and thought processes. happy  Leamon Arnt, MD

## 2021-09-20 ENCOUNTER — Telehealth: Payer: BC Managed Care – PPO | Admitting: Physician Assistant

## 2021-09-20 DIAGNOSIS — J019 Acute sinusitis, unspecified: Secondary | ICD-10-CM | POA: Diagnosis not present

## 2021-09-20 DIAGNOSIS — B9689 Other specified bacterial agents as the cause of diseases classified elsewhere: Secondary | ICD-10-CM | POA: Diagnosis not present

## 2021-09-20 MED ORDER — AMOXICILLIN-POT CLAVULANATE 875-125 MG PO TABS: 1.0000 | ORAL_TABLET | Freq: Two times a day (BID) | ORAL | 0 refills | Status: DC

## 2021-09-20 NOTE — Progress Notes (Signed)
I have spent 5 minutes in review of e-visit questionnaire, review and updating patient chart, medical decision making and response to patient.   Anastaisa Wooding Cody Andri Prestia, PA-C    

## 2021-09-20 NOTE — Progress Notes (Signed)

## 2021-10-04 ENCOUNTER — Other Ambulatory Visit: Payer: Self-pay

## 2021-10-04 ENCOUNTER — Ambulatory Visit (INDEPENDENT_AMBULATORY_CARE_PROVIDER_SITE_OTHER): Payer: BC Managed Care – PPO | Admitting: Family Medicine

## 2021-10-04 ENCOUNTER — Encounter: Payer: Self-pay | Admitting: Family Medicine

## 2021-10-04 VITALS — BP 120/84 | HR 83 | Temp 98.1°F | Ht 66.0 in | Wt 188.8 lb

## 2021-10-04 DIAGNOSIS — Z3041 Encounter for surveillance of contraceptive pills: Secondary | ICD-10-CM | POA: Diagnosis not present

## 2021-10-04 DIAGNOSIS — E782 Mixed hyperlipidemia: Secondary | ICD-10-CM

## 2021-10-04 DIAGNOSIS — Z0000 Encounter for general adult medical examination without abnormal findings: Secondary | ICD-10-CM | POA: Diagnosis not present

## 2021-10-04 DIAGNOSIS — F411 Generalized anxiety disorder: Secondary | ICD-10-CM

## 2021-10-04 LAB — CBC WITH DIFFERENTIAL/PLATELET
Basophils Absolute: 0 10*3/uL (ref 0.0–0.1)
Basophils Relative: 0.5 % (ref 0.0–3.0)
Eosinophils Absolute: 0.2 10*3/uL (ref 0.0–0.7)
Eosinophils Relative: 1.9 % (ref 0.0–5.0)
HCT: 39.4 % (ref 36.0–46.0)
Hemoglobin: 13.3 g/dL (ref 12.0–15.0)
Lymphocytes Relative: 38.5 % (ref 12.0–46.0)
Lymphs Abs: 3.6 10*3/uL (ref 0.7–4.0)
MCHC: 33.7 g/dL (ref 30.0–36.0)
MCV: 89.2 fl (ref 78.0–100.0)
Monocytes Absolute: 0.4 10*3/uL (ref 0.1–1.0)
Monocytes Relative: 4.3 % (ref 3.0–12.0)
Neutro Abs: 5.1 10*3/uL (ref 1.4–7.7)
Neutrophils Relative %: 54.8 % (ref 43.0–77.0)
Platelets: 288 10*3/uL (ref 150.0–400.0)
RBC: 4.42 Mil/uL (ref 3.87–5.11)
RDW: 13 % (ref 11.5–15.5)
WBC: 9.4 10*3/uL (ref 4.0–10.5)

## 2021-10-04 LAB — TSH: TSH: 1.21 u[IU]/mL (ref 0.35–5.50)

## 2021-10-04 MED ORDER — FLUOXETINE HCL 20 MG PO TABS: 20.0000 mg | ORAL_TABLET | Freq: Every day | ORAL | 3 refills | Status: DC

## 2021-10-04 NOTE — Progress Notes (Signed)
Subjective  Chief Complaint  Patient presents with   Annual Exam    Fasting    HPI: Gina Moreno is a 36 y.o. female who presents to [facility] today for a Female Wellness Visit.  She also has the concerns and/or needs as listed above in the chief complaint. These will be addressed in addition to the Health Maintenance Visit.   Wellness Visit: annual visit with health maintenance review and exam without Pap  Health maintenance: To see GYN next month.  Pap smear up-to-date.  Declines flu shot today.  Chronic disease management visit and/or acute problem visit: Generalized anxiety disorder: Remains on Prozac 20 mg daily.  Started 4 to 5 months ago.  Fortunately, she continues to do well on this medication.  Anxiety is well controlled.  Mood is stable.  No symptoms of depression, anxiety or panic.  No adverse effects. GERD: Rarely uses omeprazole as needed now. History of high cholesterol: Nonfasting for recheck.  had protein bar at 6:30 AM Oral contraceptive use: Stable, regular menstrual cycles  Assessment  1. Annual physical exam   2. GAD (generalized anxiety disorder)   3. Hyperlipidemia, mixed   4. Oral contraceptive use      Plan  Female Wellness Visit: Age appropriate Health Maintenance and Prevention measures were discussed with patient. Included topics are cancer screening recommendations, ways to keep healthy (see AVS) including dietary and exercise recommendations, regular eye and dental care, use of seat belts, and avoidance of moderate alcohol use and tobacco use.  BMI: discussed patient's BMI and encouraged positive lifestyle modifications to help get to or maintain a target BMI. HM needs and immunizations were addressed and ordered. See below for orders. See HM and immunization section for updates. Routine labs and screening tests ordered including cmp, cbc and lipids where appropriate. Discussed recommendations regarding Vit D and calcium  supplementation (see AVS)  Chronic disease f/u and/or acute problem visit: (deemed necessary to be done in addition to the wellness visit): Anxiety disorder: Well-controlled on Prozac 20 mg daily.  Counseling done.  We will maintain until next annual physical and then reevaluate.  Given chronic history of generalized anxiety, likely should be long-term medication.  Patient agrees. Check labs including cholesterol.  Recommend weight loss. Continue OCPs  Follow up: Return in about 1 year (around 10/04/2022) for complete physical.   Orders Placed This Encounter  Procedures   CBC with Differential/Platelet   Comprehensive metabolic panel   Lipid panel   TSH   Hepatitis C antibody   Meds ordered this encounter  Medications   FLUoxetine (PROZAC) 20 MG tablet    Sig: Take 1 tablet (20 mg total) by mouth daily.    Dispense:  90 tablet    Refill:  3       Body mass index is 30.47 kg/m. Wt Readings from Last 3 Encounters:  10/04/21 188 lb 12.8 oz (85.6 kg)  06/28/21 184 lb 9.6 oz (83.7 kg)  05/18/19 165 lb 12.8 oz (75.2 kg)     Patient Active Problem List   Diagnosis Date Noted   Oral contraceptive use 03/10/2018    Priority: High   Migraine without aura and responsive to treatment 03/22/2015    Priority: High    Mild; treated with advil; associated with menses at times, monthly    GAD (generalized anxiety disorder) 07/25/2013    Priority: High    Has failed, Cymbalta, Paxil, Wellbutrin, Zoloft and Lexapro, Effexor, Prozac Retried prozac  07/2021 with good response.     Irritable bowel syndrome with diarrhea 08/09/2021    Priority: Medium    Fibroadenoma of breast, right 06/26/2018    Priority: Medium    Learning disabilities 03/10/2018    Priority: Medium    ADD (attention deficit disorder) - mild 09/02/2017    Priority: Medium     Treated in school; not treated now. Doing ok.     GERD (gastroesophageal reflux disease) 07/25/2013    Priority: Medium     Hyperlipidemia, mixed 07/25/2013    Priority: Medium    Flow murmur 05/18/2019    Priority: Low   Seasonal allergic rhinitis due to pollen 03/10/2018    Priority: [place] Maintenance  Topic Date Due   Hepatitis C Screening  Never done   INFLUENZA VACCINE  11/17/2021 (Originally 03/20/2021)   PAP SMEAR-Modifier  08/21/2023   TETANUS/TDAP  04/29/2027   HIV Screening  Completed   HPV VACCINES  Aged Out   Immunization History  Administered Date(s) Administered   DTaP 08/16/1988, 10/09/1988, 12/07/1988, 10/17/1990, 10/15/1993   HPV Quadrivalent 01/19/2007   Hepatitis B 05/14/2000, 06/18/2000, 11/13/2000   HiB (PRP-OMP) 09/17/1989   IPV 08/16/1988, 10/09/1988, 01/12/1994   Influenza,inj,Quad PF,6+ Mos 07/23/2013, 05/20/2014   MMR 09/17/1989, 10/15/1993   Rho (D) Immune Globulin 03/15/2013   Td 08/11/2002   We updated and reviewed the patient's past history in detail and it is documented below. Allergies: Patient  reports no history of alcohol use. Past Medical History Patient  has a past medical history of Anemia (07/25/2013), Anxiety, Anxiety and depression (07/25/2013), Depression, Esophageal reflux (07/25/2013), Flow murmur (05/18/2019), GERD (gastroesophageal reflux disease), Heart murmur, History of benign breast tumor, varicella, IBS (irritable bowel syndrome), Migraine (03/22/2015), Other and unspecified hyperlipidemia (07/25/2013), and RLS (restless legs syndrome) (05/20/2014). Past Surgical History Patient  has a past surgical history that includes Wisdom tooth extraction (36 yrs old); Cholecystectomy (N/A, 10/25/2015); and Excision of breast biopsy (Right, 09/01/2018). Social History   Socioeconomic History   Marital status: Married    Spouse name: Not on file   Number of children: 2   Years of education: Not on file   Highest education level: Not on file  Occupational History   Not on file  Tobacco Use   Smoking status: Never   Smokeless tobacco: Never  Vaping Use    Vaping Use: Never used  Substance and Sexual Activity   Alcohol use: No   Drug use: No   Sexual activity: Yes    Partners: Male    Birth control/protection: Pill  Other Topics Concern   Not on file  Social History Narrative   Not on file   Social Determinants of Health   Financial Resource Strain: Not on file  Food Insecurity: Not on file  Transportation Needs: Not on file  Physical Activity: Not on file  Stress: Not on file  Social Connections: Not on file   Family History  Problem Relation Age of Onset   Hyperlipidemia Father    Arthritis Paternal Grandmother    Cancer Paternal Grandmother        cervical   Diabetes Paternal Grandmother    Depression Paternal Grandmother    Cancer Paternal Grandfather        lung stomach   Thyroid disease Mother     Review of Systems: Constitutional: negative for fever or malaise Ophthalmic: negative for photophobia, double vision or loss of vision Cardiovascular: negative for chest pain, dyspnea on exertion, or  new LE swelling Respiratory: negative for SOB or persistent cough Gastrointestinal: negative for abdominal pain, change in bowel habits or melena Genitourinary: negative for dysuria or gross hematuria, no abnormal uterine bleeding or disharge Musculoskeletal: negative for new gait disturbance or muscular weakness Integumentary: negative for new or persistent rashes, no breast lumps Neurological: negative for TIA or stroke symptoms Psychiatric: negative for SI or delusions Allergic/Immunologic: negative for hives  Patient Care Team    Relationship Specialty Notifications Start End  Leamon Arnt, MD PCP - General Family Medicine  03/10/18     Objective  Vitals: BP 120/84    Pulse 83    Temp 98.1 F (36.7 C) (Temporal)    Ht '5\' 6"'  (1.676 m)    Wt 188 lb 12.8 oz (85.6 kg)    LMP 09/16/2021    SpO2 99%    BMI 30.47 kg/m  General:  Well developed, well nourished, no acute distress  Psych:  Alert and orientedx3,normal  mood and affect HEENT:  Normocephalic, atraumatic, non-icteric sclera, PERRL, supple neck without adenopathy, mass or thyromegaly Cardiovascular:  Normal S1, S2, RRR without gallop, rub or murmur Respiratory:  Good breath sounds bilaterally, CTAB with normal respiratory effort Gastrointestinal: normal bowel sounds, soft, non-tender, no noted masses. No HSM MSK: no deformities, contusions. Joints are without erythema or swelling.  Skin:  Warm, no rashes or suspicious lesions noted Neurologic:    Mental status is normal. Gross motor and sensory exams are normal. Normal gait. No tremor    Commons side effects, risks, benefits, and alternatives for medications and treatment plan prescribed today were discussed, and the patient expressed understanding of the given instructions. Patient is instructed to call or message via MyChart if he/she has any questions or concerns regarding our treatment plan. No barriers to understanding were identified. We discussed Red Flag symptoms and signs in detail. Patient expressed understanding regarding what to do in case of urgent or emergency type symptoms.  Medication list was reconciled, printed and provided to the patient in AVS. Patient instructions and summary information was reviewed with the patient as documented in the AVS. This note was prepared with assistance of Dragon voice recognition software. Occasional wrong-word or sound-a-like substitutions may have occurred due to the inherent limitations of voice recognition software  This visit occurred during the SARS-CoV-2 public health emergency.  Safety protocols were in place, including screening questions prior to the visit, additional usage of staff PPE, and extensive cleaning of exam room while observing appropriate contact time as indicated for disinfecting solutions.

## 2021-10-04 NOTE — Patient Instructions (Signed)
Please return in 12 months for your annual complete physical; please come fasting.   I will release your lab results to you on your MyChart account with further instructions. You may see the results before I do, but when I review them I will send you a message with my report or have my assistant call you if things need to be discussed. Please reply to my message with any questions. Thank you!   Stay on your prozac; set up an appointment if you are having problems with mood or the medication. Do not stop it abruptly.   If you have any questions or concerns, please don't hesitate to send me a message via MyChart or call the office at [phone]. Thank you for visiting with Gina Moreno today! It's our pleasure caring for you.   Please do these things to maintain good health!  Exercise at least 30-45 minutes a day,  4-5 days a week.  Eat a low-fat diet with lots of fruits and vegetables, up to 7-9 servings per day. Drink plenty of water daily. Try to drink 8 8oz glasses per day. Seatbelts can save your life. Always wear your seatbelt. Place Smoke Detectors on every level of your home and check batteries every year. Schedule an appointment with an eye doctor for an eye exam every 1-2 years Safe sex - use condoms to protect yourself from STDs if you could be exposed to these types of infections. Use birth control if you do not want to become pregnant and are sexually active. Avoid heavy alcohol use. If you drink, keep it to less than 2 drinks/day and not every day. [place].  Choose someone you trust that could speak for you if you became unable to speak for yourself. Depression is common in our stressful world.If you're feeling down or losing interest in things you normally enjoy, please come in for a visit. If anyone is threatening or hurting you, please get help. Physical or Emotional Violence is never OK.

## 2021-10-05 LAB — COMPREHENSIVE METABOLIC PANEL
ALT: 13 U/L (ref 0–35)
AST: 12 U/L (ref 0–37)
Albumin: 4.3 g/dL (ref 3.5–5.2)
Alkaline Phosphatase: 58 U/L (ref 39–117)
BUN: 14 mg/dL (ref 6–23)
CO2: 25 mEq/L (ref 19–32)
Calcium: 9.5 mg/dL (ref 8.4–10.5)
Chloride: 104 mEq/L (ref 96–112)
Creatinine, Ser: 0.65 mg/dL (ref 0.40–1.20)
GFR: 115.77 mL/min (ref >60.00)
Glucose, Bld: 93 mg/dL (ref 70–99)
Potassium: 4.2 mEq/L (ref 3.5–5.1)
Sodium: 140 mEq/L (ref 135–145)
Total Bilirubin: 0.6 mg/dL (ref 0.2–1.2)
Total Protein: 8 g/dL (ref 6.0–8.3)

## 2021-10-05 LAB — LIPID PANEL
Cholesterol: 246 mg/dL — ABNORMAL HIGH (ref 0–200)
HDL: 73 mg/dL (ref >39.00)
LDL Cholesterol: 146 mg/dL — ABNORMAL HIGH (ref 0–99)
NonHDL: 173.23
Total CHOL/HDL Ratio: 3
Triglycerides: 134 mg/dL (ref 0.0–149.0)
VLDL: 26.8 mg/dL (ref 0.0–40.0)

## 2021-10-05 LAB — HEPATITIS C ANTIBODY
Hepatitis C Ab: NONREACTIVE
SIGNAL TO CUT-OFF: 0.08 (ref <1.00)

## 2022-05-14 ENCOUNTER — Encounter: Payer: Self-pay | Admitting: *Deleted

## 2022-07-02 ENCOUNTER — Ambulatory Visit (INDEPENDENT_AMBULATORY_CARE_PROVIDER_SITE_OTHER): Payer: BC Managed Care – PPO

## 2022-07-02 ENCOUNTER — Ambulatory Visit: Payer: BC Managed Care – PPO | Admitting: Podiatry

## 2022-07-02 DIAGNOSIS — M7732 Calcaneal spur, left foot: Secondary | ICD-10-CM

## 2022-07-02 DIAGNOSIS — M79672 Pain in left foot: Secondary | ICD-10-CM

## 2022-07-02 DIAGNOSIS — M722 Plantar fascial fibromatosis: Secondary | ICD-10-CM | POA: Diagnosis not present

## 2022-07-02 MED ORDER — TRIAMCINOLONE ACETONIDE 10 MG/ML IJ SUSP
10.0000 mg | Freq: Once | INTRAMUSCULAR | Status: AC
  Administered 2022-07-02: 10 mg

## 2022-07-02 MED ORDER — METHYLPREDNISOLONE 4 MG PO TBPK: ORAL_TABLET | ORAL | 0 refills | Status: DC

## 2022-07-02 NOTE — Progress Notes (Unsigned)
Subjective:   Patient ID: Gina Moreno, female   DOB: 36 y.o.   MRN: [id number]   HPI  Chief Complaint  Patient presents with   Plantar Fasciitis    Left foot heel pain started 3 weeks ago, rate of pain 6 out of 10 while walking, TX: injection has helped, X-Rays taken today     Started about 3 weeks ago. It does not hurt when sitting but when she gets moving it tarts to hurt. No injuries, numbness/tingling, swelling. No recent treatment otherwise.    ROS      Objective:  Physical Exam  ***     Assessment:  ***     Plan:  ***    -Inject -PF brace

## 2022-07-02 NOTE — Patient Instructions (Signed)
If was nice to meet you today. If you have any questions or any further concerns, please feel fee to give me a call. You can call our office at [phone] or please feel fee to send me a message through MyChart.   ----  For instructions on how to put on your Plantar Fascial Brace, please visit [email or web address]  ---     Plantar Fasciitis (Heel Spur Syndrome) with Rehab The plantar fascia is a fibrous, ligament-like, soft-tissue structure that spans the bottom of the foot. Plantar fasciitis is a condition that causes pain in the foot due to inflammation of the tissue. SYMPTOMS  Pain and tenderness on the underneath side of the foot. Pain that worsens with standing or walking. CAUSES  Plantar fasciitis is caused by irritation and injury to the plantar fascia on the underneath side of the foot. Common mechanisms of injury include: Direct trauma to bottom of the foot. Damage to a small nerve that runs under the foot where the main fascia attaches to the heel bone. Stress placed on the plantar fascia due to bone spurs. RISK INCREASES WITH:  Activities that place stress on the plantar fascia (running, jumping, pivoting, or cutting). Poor strength and flexibility. Improperly fitted shoes. Tight calf muscles. Flat feet. Failure to warm-up properly before activity. Obesity. PREVENTION Warm up and stretch properly before activity. Allow for adequate recovery between workouts. Maintain physical fitness: Strength, flexibility, and endurance. Cardiovascular fitness. Maintain a health body weight. Avoid stress on the plantar fascia. Wear properly fitted shoes, including arch supports for individuals who have flat feet.  PROGNOSIS  If treated properly, then the symptoms of plantar fasciitis usually resolve without surgery. However, occasionally surgery is necessary.  RELATED COMPLICATIONS  Recurrent symptoms that may result in a chronic condition. Problems of the lower back  that are caused by compensating for the injury, such as limping. Pain or weakness of the foot during push-off following surgery. Chronic inflammation, scarring, and partial or complete fascia tear, occurring more often from repeated injections.  TREATMENT  Treatment initially involves the use of ice and medication to help reduce pain and inflammation. The use of strengthening and stretching exercises may help reduce pain with activity, especially stretches of the Achilles tendon. These exercises may be performed at home or with a therapist. Your caregiver may recommend that you use heel cups of arch supports to help reduce stress on the plantar fascia. Occasionally, corticosteroid injections are given to reduce inflammation. If symptoms persist for greater than 6 months despite non-surgical (conservative), then surgery may be recommended.   MEDICATION  If pain medication is necessary, then nonsteroidal anti-inflammatory medications, such as aspirin and ibuprofen, or other minor pain relievers, such as acetaminophen, are often recommended. Do not take pain medication within 7 days before surgery. Prescription pain relievers may be given if deemed necessary by your caregiver. Use only as directed and only as much as you need. Corticosteroid injections may be given by your caregiver. These injections should be reserved for the most serious cases, because they may only be given a certain number of times.  HEAT AND COLD Cold treatment (icing) relieves pain and reduces inflammation. Cold treatment should be applied for 10 to 15 minutes every 2 to 3 hours for inflammation and pain and immediately after any activity that aggravates your symptoms. Use ice packs or massage the area with a piece of ice (ice massage). Heat treatment may be used prior to performing the stretching and strengthening activities prescribed by your caregiver,   physical therapist, or athletic trainer. Use a heat pack or soak the injury  in warm water.  SEEK IMMEDIATE MEDICAL CARE IF: Treatment seems to offer no benefit, or the condition worsens. Any medications produce adverse side effects.  EXERCISES- RANGE OF MOTION (ROM) AND STRETCHING EXERCISES - Plantar Fasciitis (Heel Spur Syndrome) These exercises may help you when beginning to rehabilitate your injury. Your symptoms may resolve with or without further involvement from your physician, physical therapist or athletic trainer. While completing these exercises, remember:  Restoring tissue flexibility helps normal motion to return to the joints. This allows healthier, less painful movement and activity. An effective stretch should be held for at least 30 seconds. A stretch should never be painful. You should only feel a gentle lengthening or release in the stretched tissue.  RANGE OF MOTION - Toe Extension, Flexion Sit with your right / left leg crossed over your opposite knee. Grasp your toes and gently pull them back toward the top of your foot. You should feel a stretch on the bottom of your toes and/or foot. Hold this stretch for 10 seconds. Now, gently pull your toes toward the bottom of your foot. You should feel a stretch on the top of your toes and or foot. Hold this stretch for 10 seconds. Repeat  times. Complete this stretch 3 times per day.   RANGE OF MOTION - Ankle Dorsiflexion, Active Assisted Remove shoes and sit on a chair that is preferably not on a carpeted surface. Place right / left foot under knee. Extend your opposite leg for support. Keeping your heel down, slide your right / left foot back toward the chair until you feel a stretch at your ankle or calf. If you do not feel a stretch, slide your bottom forward to the edge of the chair, while still keeping your heel down. Hold this stretch for 10 seconds. Repeat 3 times. Complete this stretch 2 times per day.   STRETCH  Gastroc, Standing Place hands on wall. Extend right / left leg, keeping the  front knee somewhat bent. Slightly point your toes inward on your back foot. Keeping your right / left heel on the floor and your knee straight, shift your weight toward the wall, not allowing your back to arch. You should feel a gentle stretch in the right / left calf. Hold this position for 10 seconds. Repeat 3 times. Complete this stretch 2 times per day.  STRETCH  Soleus, Standing Place hands on wall. Extend right / left leg, keeping the other knee somewhat bent. Slightly point your toes inward on your back foot. Keep your right / left heel on the floor, bend your back knee, and slightly shift your weight over the back leg so that you feel a gentle stretch deep in your back calf. Hold this position for 10 seconds. Repeat 3 times. Complete this stretch 2 times per day.  STRETCH  Gastrocsoleus, Standing  Note: This exercise can place a lot of stress on your foot and ankle. Please complete this exercise only if specifically instructed by your caregiver.  Place the ball of your right / left foot on a step, keeping your other foot firmly on the same step. Hold on to the wall or a rail for balance. Slowly lift your other foot, allowing your body weight to press your heel down over the edge of the step. You should feel a stretch in your right / left calf. Hold this position for 10 seconds. Repeat this exercise with a   slight bend in your right / left knee. Repeat 3 times. Complete this stretch 2 times per day.   STRENGTHENING EXERCISES - Plantar Fasciitis (Heel Spur Syndrome)  These exercises may help you when beginning to rehabilitate your injury. They may resolve your symptoms with or without further involvement from your physician, physical therapist or athletic trainer. While completing these exercises, remember:  Muscles can gain both the endurance and the strength needed for everyday activities through controlled exercises. Complete these exercises as instructed by your physician,  physical therapist or athletic trainer. Progress the resistance and repetitions only as guided.  STRENGTH - Towel Curls Sit in a chair positioned on a non-carpeted surface. Place your foot on a towel, keeping your heel on the floor. Pull the towel toward your heel by only curling your toes. Keep your heel on the floor. Repeat 3 times. Complete this exercise 2 times per day.  STRENGTH - Ankle Inversion Secure one end of a rubber exercise band/tubing to a fixed object (table, pole). Loop the other end around your foot just before your toes. Place your fists between your knees. This will focus your strengthening at your ankle. Slowly, pull your big toe up and in, making sure the band/tubing is positioned to resist the entire motion. Hold this position for 10 seconds. Have your muscles resist the band/tubing as it slowly pulls your foot back to the starting position. Repeat 3 times. Complete this exercises 2 times per day.  Document Released: 08/06/2005 Document Revised: 10/29/2011 Document Reviewed: 11/18/2008 [employer] Patient Information 2014 [employer].  

## 2022-07-16 ENCOUNTER — Other Ambulatory Visit: Payer: Self-pay | Admitting: Podiatry

## 2022-07-16 DIAGNOSIS — M79672 Pain in left foot: Secondary | ICD-10-CM

## 2022-07-16 DIAGNOSIS — M7732 Calcaneal spur, left foot: Secondary | ICD-10-CM

## 2022-07-16 DIAGNOSIS — M722 Plantar fascial fibromatosis: Secondary | ICD-10-CM

## 2022-08-02 ENCOUNTER — Encounter: Payer: Self-pay | Admitting: *Deleted

## 2022-08-07 ENCOUNTER — Ambulatory Visit: Payer: BC Managed Care – PPO | Admitting: Podiatry

## 2022-08-09 ENCOUNTER — Telehealth: Payer: BC Managed Care – PPO | Admitting: Family Medicine

## 2022-10-04 ENCOUNTER — Ambulatory Visit (INDEPENDENT_AMBULATORY_CARE_PROVIDER_SITE_OTHER): Payer: BC Managed Care – PPO | Admitting: Podiatry

## 2022-10-04 ENCOUNTER — Encounter: Payer: Self-pay | Admitting: Podiatry

## 2022-10-04 DIAGNOSIS — M722 Plantar fascial fibromatosis: Secondary | ICD-10-CM | POA: Diagnosis not present

## 2022-10-04 DIAGNOSIS — M7732 Calcaneal spur, left foot: Secondary | ICD-10-CM

## 2022-10-04 MED ORDER — TRIAMCINOLONE ACETONIDE 10 MG/ML IJ SUSP
10.0000 mg | Freq: Once | INTRAMUSCULAR | Status: AC
  Administered 2022-10-04: 10 mg

## 2022-10-04 MED ORDER — DICLOFENAC SODIUM 75 MG PO TBEC: 75.0000 mg | DELAYED_RELEASE_TABLET | Freq: Two times a day (BID) | ORAL | 2 refills | Status: DC

## 2022-10-04 NOTE — Progress Notes (Signed)
Subjective:   Patient ID: Gina Moreno, female   DOB: 37 y.o.   MRN: [id number]   HPI Patient states that heel pain only got better for short period of time in my heel left and it is more in the center outside of the heel now on a no long-term I will probably need inserts and it is worse after I been sitting or I get or in the morning   ROS      Objective:  Physical Exam  Neurovascular status intact inflammation pain of the center and center lateral aspect of the plantar fascia at its insertion calcaneus plantar with medial pain which is mild     Assessment:  Acute fasciitis-like symptomatology left with central and lateral involvement with patient moderate depression of the arch     Plan:  Long-term recommended new or biotics but at this point I did go ahead from the lateral side I injected the center lateral aspect of the fascia 3 mg Kenalog 5 mg Xylocaine after sterile prep and applied sterile dressing.  I then went ahead and applied night splint with all instructions on usage along with aggressive ice placed on oral diclofenac and reappoint to recheck in 3 weeks

## 2022-10-05 ENCOUNTER — Encounter: Payer: BC Managed Care – PPO | Admitting: Family Medicine

## 2022-10-05 ENCOUNTER — Other Ambulatory Visit: Payer: Self-pay | Admitting: Family Medicine

## 2022-10-11 LAB — HM PAP SMEAR: HM Pap smear: NORMAL

## 2022-10-17 ENCOUNTER — Ambulatory Visit (INDEPENDENT_AMBULATORY_CARE_PROVIDER_SITE_OTHER): Payer: BC Managed Care – PPO | Admitting: Family Medicine

## 2022-10-17 ENCOUNTER — Encounter: Payer: Self-pay | Admitting: Family Medicine

## 2022-10-17 VITALS — BP 144/92 | HR 80 | Temp 97.9°F | Ht 66.0 in | Wt 196.2 lb

## 2022-10-17 DIAGNOSIS — G43009 Migraine without aura, not intractable, without status migrainosus: Secondary | ICD-10-CM | POA: Diagnosis not present

## 2022-10-17 DIAGNOSIS — J301 Allergic rhinitis due to pollen: Secondary | ICD-10-CM

## 2022-10-17 DIAGNOSIS — Z3041 Encounter for surveillance of contraceptive pills: Secondary | ICD-10-CM

## 2022-10-17 DIAGNOSIS — R030 Elevated blood-pressure reading, without diagnosis of hypertension: Secondary | ICD-10-CM

## 2022-10-17 DIAGNOSIS — Z0000 Encounter for general adult medical examination without abnormal findings: Secondary | ICD-10-CM

## 2022-10-17 DIAGNOSIS — F411 Generalized anxiety disorder: Secondary | ICD-10-CM | POA: Diagnosis not present

## 2022-10-17 NOTE — Progress Notes (Signed)
Subjective  Chief Complaint  Patient presents with   Annual Exam    Pt here for Annual Exam and is currently fasting     HPI: Gina Moreno is a 37 y.o. female who presents to [facility] today for a Female Wellness Visit.  She also has the concerns and/or needs as listed above in the chief complaint. These will be addressed in addition to the Health Maintenance Visit.   Wellness Visit: annual visit with health maintenance review and exam without Pap  HM: see gyn; pap up to date. On ocps w/ regular cycles. Happy with home life, 2 young children and work. Declines flu vaccine. Believes she had all of her gardasil vaccines but will try to confirm. I reviewed ob records from 2018: tdap update 12/2016.  Chronic disease management visit and/or acute problem visit: Migraine, menstrual related and stable. Uses advil.  GAD is well controlled on prozac 20daily. No concerns. No h/o htn but possible white coat syndrome. Bp was elevated at GYN office. Elevated in office today. Hasn't started checking home bps. No cp, palpitations, edema, hot flushes. +FH htn in father. Allergies: uses benadryl nightly. Has seen an allergist.   Assessment  1. Annual physical exam   2. GAD (generalized anxiety disorder)   3. Migraine without aura and responsive to treatment   4. Oral contraceptive use   5. Elevated blood pressure reading without diagnosis of hypertension   6. Seasonal allergic rhinitis due to pollen      Plan  Female Wellness Visit: Age appropriate Health Maintenance and Prevention measures were discussed with patient. Included topics are cancer screening recommendations, ways to keep healthy (see AVS) including dietary and exercise recommendations, regular eye and dental care, use of seat belts, and avoidance of moderate alcohol use and tobacco use.  BMI: discussed patient's BMI and encouraged positive lifestyle modifications to help get to or maintain a target  BMI. HM needs and immunizations were addressed and ordered. See below for orders. See HM and immunization section for updates. Pt to find out if needs gardasil series completed. Declined flu Routine labs and screening tests ordered including cmp, cbc and lipids where appropriate. Discussed recommendations regarding Vit D and calcium supplementation (see AVS)  Chronic disease f/u and/or acute problem visit: (deemed necessary to be done in addition to the wellness visit): GAD:  well controlled on prozac 20 daily.  Menstrual migraines: stable. Advil at onset.  Continue daily ocps.  Education about htn, white coat syndrome and home monitoring given. Low salt diet, weight loss and send me readings in 2 weeks.  Start nightly zyrtec for allergies to cover 24 hours. Pt agrees. Can stop benadryl.   Follow up: 12 mo for cpe   Orders Placed This Encounter  Procedures   CBC with Differential/Platelet   Comprehensive metabolic panel   Lipid panel   No orders of the defined types were placed in this encounter.      Body mass index is 31.67 kg/m. Wt Readings from Last 3 Encounters:  10/17/22 196 lb 3.2 oz (89 kg)  10/04/21 188 lb 12.8 oz (85.6 kg)  06/28/21 184 lb 9.6 oz (83.7 kg)     Patient Active Problem List   Diagnosis Date Noted   Oral contraceptive use 03/10/2018    Priority: High   Migraine without aura and responsive to treatment 03/22/2015    Priority: High    Mild; treated with advil; associated with menses at times, monthly  GAD (generalized anxiety disorder) 07/25/2013    Priority: High    Has failed, Cymbalta, Paxil, Wellbutrin, Zoloft and Lexapro, Effexor, Prozac Retried prozac 07/2021 with good response.     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 Doing ok.     GERD (gastroesophageal reflux disease) 07/25/2013    Priority: Medium    Flow murmur 05/18/2019    Priority: Low   Seasonal allergic rhinitis due to pollen 03/10/2018    Priority: [place] Maintenance  Topic Date Due   HPV VACCINES (2 - Risk 3-dose series) 02/16/2007   COVID-19 Vaccine (1) 11/02/2022 (Originally 06/13/1993)   INFLUENZA VACCINE  11/18/2022 (Originally 03/20/2022)   DTaP/Tdap/Td (8 - Td or Tdap) 01/08/2027   PAP SMEAR-Modifier  10/12/2027   Hepatitis C Screening  Completed   HIV Screening  Completed   Immunization History  Administered Date(s) Administered   DTaP 08/16/1988, 10/09/1988, 12/07/1988, 10/17/1990, 10/15/1993   HIB (PRP-OMP) 09/17/1989   HPV Quadrivalent 01/19/2007   Hepatitis B 05/14/2000, 06/18/2000, 11/13/2000   IPV 08/16/1988, 10/09/1988, 01/12/1994   Influenza,inj,Quad PF,6+ Mos 07/23/2013, 05/20/2014   MMR 09/17/1989, 10/15/1993   Rho (D) Immune Globulin 03/15/2013   Td 08/11/2002   Tdap 01/07/2017   We updated and reviewed the patient's past history in detail and it is documented below. Allergies: Patient  reports no history of alcohol use. Past Medical History Patient  has a past medical history of Anemia (07/25/2013), Anxiety, Anxiety and depression (07/25/2013), Depression, Esophageal reflux (07/25/2013), Flow murmur (05/18/2019), GERD (gastroesophageal reflux disease), Heart murmur, History of benign breast tumor, varicella, IBS (irritable bowel syndrome), Migraine (03/22/2015), Other and unspecified hyperlipidemia (07/25/2013), and RLS (restless legs syndrome) (05/20/2014). Past Surgical History Patient  has a past surgical history that includes Wisdom tooth extraction (37 yrs old); Cholecystectomy (N/A, 10/25/2015); and Excision of breast biopsy (Right, 09/01/2018). Social History   Socioeconomic History   Marital status: Married    Spouse name: Not on file   Number of children: 2    Years of education: Not on file   Highest education level: Not on file  Occupational History   Not on file  Tobacco Use   Smoking status: Never   Smokeless tobacco: Never  Vaping Use   Vaping Use: Never used  Substance and Sexual Activity   Alcohol use: No   Drug use: No   Sexual activity: Yes    Partners: Male    Birth control/protection: Pill  Other Topics Concern   Not on file  Social History Narrative   Not on file   Social Determinants of Health   Financial Resource Strain: Not on file  Food Insecurity: Not on file  Transportation Needs: Not on file  Physical Activity: Not on file  Stress: Not on file  Social Connections: Not on file   Family History  Problem Relation Age of Onset   Hyperlipidemia Father    Arthritis Paternal Grandmother    Cancer Paternal Grandmother        cervical   Diabetes Paternal Grandmother    Depression Paternal Grandmother    Cancer Paternal Grandfather        lung stomach   Thyroid disease Mother  Review of Systems: Constitutional: negative for fever or malaise Ophthalmic: negative for photophobia, double vision or loss of vision Cardiovascular: negative for chest pain, dyspnea on exertion, or new LE swelling Respiratory: negative for SOB or persistent cough Gastrointestinal: negative for abdominal pain, change in bowel habits or melena Genitourinary: negative for dysuria or gross hematuria, no abnormal uterine bleeding or disharge Musculoskeletal: negative for new gait disturbance or muscular weakness Integumentary: negative for new or persistent rashes, no breast lumps Neurological: negative for TIA or stroke symptoms Psychiatric: negative for SI or delusions Allergic/Immunologic: negative for hives  Patient Care Team    Relationship Specialty Notifications Start End  Leamon Arnt, MD PCP - General Family Medicine  03/10/18     Objective  Vitals: BP (!) 144/92   Pulse 80   Temp 97.9 F (36.6 C)   Ht '5\' 6"'$   (1.676 m)   Wt 196 lb 3.2 oz (89 kg)   SpO2 97%   BMI 31.67 kg/m  General:  Well developed, well nourished, no acute distress  Psych:  Alert and orientedx3,normal mood and affect HEENT:  Normocephalic, atraumatic, non-icteric sclera, PERRL, supple neck without adenopathy, mass or thyromegaly Cardiovascular:  Normal S1, S2, RRR without gallop, rub or murmur Respiratory:  Good breath sounds bilaterally, CTAB with normal respiratory effort Gastrointestinal: normal bowel sounds, soft, non-tender, no noted masses. No HSM MSK: no deformities, contusions. Joints are without erythema or swelling.  Skin:  Warm, no rashes or suspicious lesions noted    Commons side effects, risks, benefits, and alternatives for medications and treatment plan prescribed today were discussed, and the patient expressed understanding of the given instructions. Patient is instructed to call or message via MyChart if he/she has any questions or concerns regarding our treatment plan. No barriers to understanding were identified. We discussed Red Flag symptoms and signs in detail. Patient expressed understanding regarding what to do in case of urgent or emergency type symptoms.  Medication list was reconciled, printed and provided to the patient in AVS. Patient instructions and summary information was reviewed with the patient as documented in the AVS. This note was prepared with assistance of Dragon voice recognition software. Occasional wrong-word or sound-a-like substitutions may have occurred due to the inherent limitations of voice recognition software .

## 2022-10-17 NOTE — Patient Instructions (Addendum)
Please return in 12 months for your annual complete physical; please come fasting.   Please check your blood pressures at home and send me the numbers in a few weeks.  We want it to be < 140/90, ideally normal bp is 120s/70s. Eat low salt and try to lose 2-3 pounds.   Start zyrtec nightly for your allergies.   I will release your lab results to you on your MyChart account with further instructions. You may see the results before I do, but when I review them I will send you a message with my report or have my assistant call you if things need to be discussed. Please reply to my message with any questions. Thank you!   If you have any questions or concerns, please don't hesitate to send me a message via MyChart or call the office at [phone]. Thank you for visiting with [place] today! It's our pleasure caring for you.   Please do these things to maintain good health!  Exercise at least 30-45 minutes a day,  4-5 days a week.  Eat a low-fat diet with lots of fruits and vegetables, up to 7-9 servings per day. Drink plenty of water daily. Try to drink 8 8oz glasses per day. Seatbelts can save your life. Always wear your seatbelt. Place Smoke Detectors on every level of your home and check batteries every year. Schedule an appointment with an eye doctor for an eye exam every 1-2 years Safe sex - use condoms to protect yourself from STDs if you could be exposed to these types of infections. Use birth control if you do not want to become pregnant and are sexually active. Avoid heavy alcohol use. If you drink, keep it to less than 2 drinks/day and not every day. [place].  Choose someone you trust that could speak for you if you became unable to speak for yourself. Depression is common in our stressful world.If you're feeling down or losing interest in things you normally enjoy, please come in for a visit. If anyone is threatening or hurting you, please get help. Physical or  Emotional Violence is never OK.

## 2022-10-18 LAB — CBC WITH DIFFERENTIAL/PLATELET
Absolute Monocytes: 500 cells/uL (ref 200–950)
Basophils Absolute: 40 cells/uL (ref 0–200)
Basophils Relative: 0.4 %
Eosinophils Absolute: 180 cells/uL (ref 15–500)
Eosinophils Relative: 1.8 %
HCT: 42.4 % (ref 35.0–45.0)
Hemoglobin: 14.3 g/dL (ref 11.7–15.5)
Lymphs Abs: 3810 cells/uL (ref 850–3900)
MCH: 29.9 pg (ref 27.0–33.0)
MCHC: 33.7 g/dL (ref 32.0–36.0)
MCV: 88.5 fL (ref 80.0–100.0)
MPV: 10.6 fL (ref 7.5–12.5)
Monocytes Relative: 5 %
Neutro Abs: 5470 cells/uL (ref 1500–7800)
Neutrophils Relative %: 54.7 %
Platelets: 345 10*3/uL (ref 140–400)
RBC: 4.79 10*6/uL (ref 3.80–5.10)
RDW: 12.4 % (ref 11.0–15.0)
Total Lymphocyte: 38.1 %
WBC: 10 10*3/uL (ref 3.8–10.8)

## 2022-10-18 LAB — LIPID PANEL
Cholesterol: 240 mg/dL — ABNORMAL HIGH (ref <200)
HDL: 81 mg/dL (ref >=50)
LDL Cholesterol (Calc): 138 mg/dL (calc) — ABNORMAL HIGH
Non-HDL Cholesterol (Calc): 159 mg/dL (calc) — ABNORMAL HIGH (ref <130)
Total CHOL/HDL Ratio: 3 (calc) (ref <5.0)
Triglycerides: 103 mg/dL (ref <150)

## 2022-10-18 LAB — COMPREHENSIVE METABOLIC PANEL
AG Ratio: 1.4 (calc) (ref 1.0–2.5)
ALT: 12 U/L (ref 6–29)
AST: 13 U/L (ref 10–30)
Albumin: 4.2 g/dL (ref 3.6–5.1)
Alkaline phosphatase (APISO): 70 U/L (ref 31–125)
BUN: 15 mg/dL (ref 7–25)
CO2: 25 mmol/L (ref 20–32)
Calcium: 9.4 mg/dL (ref 8.6–10.2)
Chloride: 104 mmol/L (ref 98–110)
Creat: 0.64 mg/dL (ref 0.50–0.97)
Globulin: 3.1 g/dL (calc) (ref 1.9–3.7)
Glucose, Bld: 86 mg/dL (ref 65–99)
Potassium: 4.6 mmol/L (ref 3.5–5.3)
Sodium: 140 mmol/L (ref 135–146)
Total Bilirubin: 0.5 mg/dL (ref 0.2–1.2)
Total Protein: 7.3 g/dL (ref 6.1–8.1)

## 2022-11-01 ENCOUNTER — Ambulatory Visit: Payer: BC Managed Care – PPO | Admitting: Podiatry

## 2022-11-01 ENCOUNTER — Encounter: Payer: BC Managed Care – PPO | Admitting: Family Medicine

## 2023-01-04 DIAGNOSIS — D250 Submucous leiomyoma of uterus: Secondary | ICD-10-CM

## 2023-02-16 ENCOUNTER — Other Ambulatory Visit: Payer: Self-pay | Admitting: Podiatry

## 2023-04-10 ENCOUNTER — Encounter (HOSPITAL_BASED_OUTPATIENT_CLINIC_OR_DEPARTMENT_OTHER): Payer: Self-pay | Admitting: Obstetrics & Gynecology

## 2023-04-10 NOTE — Progress Notes (Signed)
Spoke w/ via phone for pre-op interview--- [employer]----   CBC, T&S and UPT per surgeon            Lab results------ COVID test -----patient states asymptomatic no test needed Arrive at -------0530 NPO after MN NO Solid Food. Med rec completed Medications to take morning of surgery ----- Junel and Prozac Diabetic medication ----- Patient instructed no nail polish to be worn day of surgery Patient instructed to bring photo id and insurance card day of surgery Patient aware to have Driver (ride ) / caregiver Mother Albertine Grates   for 24 hours after surgery  Patient Special Instructions ----- Pre-Op special Instructions ----- Patient verbalized understanding of instructions that were given at this phone interview. Patient denies shortness of breath, chest pain, fever, cough at this phone interview.

## 2023-04-11 NOTE — H&P (Signed)
Gina Moreno is an 37 y.o. female G2P2 with intermenstrual spotting despite OCPs.  SIS on 3/19 showed 15 mm submucosal fibroid; otherwise normal anatomy.    Pertinent Gynecological History: Menses: flow is moderate Bleeding: intermenstrual bleeding Contraception: OCP (estrogen/progesterone) DES exposure: unknown Blood transfusions: none Sexually transmitted diseases: no past history Previous GYN Procedures:  none   Last mammogram:  n/a  Date: n/a Last pap: normal Date: 10/11/22 OB History: G2, P2   Menstrual History: Menarche age: n/a Patient's last menstrual period was 04/06/2023 (exact date).    Past Medical History:  Diagnosis Date   Anemia 07/25/2013   Anxiety    Anxiety and depression 07/25/2013   Depression    Esophageal reflux 07/25/2013   Flow murmur 05/18/2019   GERD (gastroesophageal reflux disease)    Heart murmur    at birth    History of benign breast tumor    Hx of varicella    IBS (irritable bowel syndrome)    Migraine 03/22/2015   Other and unspecified hyperlipidemia 07/25/2013   RLS (restless legs syndrome) 05/20/2014    Past Surgical History:  Procedure Laterality Date   CHOLECYSTECTOMY N/A 10/25/2015   Procedure: LAPAROSCOPIC CHOLECYSTECTOMY;  Surgeon: Gaynelle Adu, MD;  Location: [facility];  Service: General;  Laterality: N/A;   EXCISION OF BREAST BIOPSY Right 09/01/2018   Procedure: EXCISIONAL OF BREAST BIOPSY;  Surgeon: Lucretia Roers, MD;  Location: [facility];  Service: General;  Laterality: Right;   WISDOM TOOTH EXTRACTION  37 yrs old    Family History  Problem Relation Age of Onset   Hyperlipidemia Father    Arthritis Paternal Grandmother    Cancer Paternal Grandmother        cervical   Diabetes Paternal Grandmother    Depression Paternal Grandmother    Cancer Paternal Grandfather        lung stomach   Thyroid disease Mother     Social History:  reports that she has never smoked. She has never used smokeless tobacco. She reports that she does  not drink alcohol and does not use drugs.  Allergies:  Allergies  Allergen Reactions   Sulfamethoxazole Dermatitis   Amlodipine Other (See Comments)    "made feel weird"   Imitrex [Sumatriptan]     Syncope   Phenergan [Promethazine Hcl]     Uncontrollable arm movement.    Doxycycline Rash   Flagyl [Metronidazole] Rash    No medications prior to admission.    Review of Systems  Height 5\' 6"  (1.676 m), weight 90.7 kg, last menstrual period 04/06/2023. Physical Exam Constitutional:      Appearance: Normal appearance.  HENT:     Head: Normocephalic and atraumatic.  Pulmonary:     Effort: Pulmonary effort is normal.  Abdominal:     Palpations: Abdomen is soft.  Musculoskeletal:        General: Normal range of motion.     Cervical back: Normal range of motion.  Skin:    General: Skin is warm and dry.  Neurological:     Mental Status: She is alert and oriented to person, place, and time.  Psychiatric:        Mood and Affect: Mood normal.        Behavior: Behavior normal.     No results found for this or any previous visit (from the past 24 hour(s)).  No results found.  Assessment/Plan: 36yo G2P2 with submucosal fibroid. -H/S, D&C, Myosure -Patient is counseled re: risk of bleeding, infection, scarring  and damage to surrounding structures.  She is informed of steps of procedure as well as postop expectations and limitations.  All questions were answered and patient wishes to proceed.    Mitchel Honour 04/11/2023, 9:22 AM

## 2023-04-18 ENCOUNTER — Ambulatory Visit (HOSPITAL_BASED_OUTPATIENT_CLINIC_OR_DEPARTMENT_OTHER): Payer: BC Managed Care – PPO | Admitting: Anesthesiology

## 2023-04-18 ENCOUNTER — Ambulatory Visit (HOSPITAL_BASED_OUTPATIENT_CLINIC_OR_DEPARTMENT_OTHER)
Admission: RE | Admit: 2023-04-18 | Discharge: 2023-04-18 | Disposition: A | Discharge status: Home / Self Care | Payer: BC Managed Care – PPO | Attending: Obstetrics & Gynecology | Admitting: Obstetrics & Gynecology

## 2023-04-18 ENCOUNTER — Other Ambulatory Visit: Payer: Self-pay

## 2023-04-18 ENCOUNTER — Encounter (HOSPITAL_BASED_OUTPATIENT_CLINIC_OR_DEPARTMENT_OTHER)
Admission: RE | Disposition: A | Discharge status: Home / Self Care | Payer: Self-pay | Source: Home / Self Care | Attending: Obstetrics & Gynecology

## 2023-04-18 ENCOUNTER — Encounter (HOSPITAL_BASED_OUTPATIENT_CLINIC_OR_DEPARTMENT_OTHER): Payer: Self-pay | Admitting: Obstetrics & Gynecology

## 2023-04-18 DIAGNOSIS — D250 Submucous leiomyoma of uterus: Secondary | ICD-10-CM | POA: Diagnosis not present

## 2023-04-18 DIAGNOSIS — N939 Abnormal uterine and vaginal bleeding, unspecified: Secondary | ICD-10-CM | POA: Diagnosis present

## 2023-04-18 HISTORY — PX: DILATATION & CURETTAGE/HYSTEROSCOPY WITH MYOSURE: SHX6511

## 2023-04-18 LAB — CBC
HCT: 43.2 % (ref 36.0–46.0)
Hemoglobin: 14.2 g/dL (ref 12.0–15.0)
MCH: 30.1 pg (ref 26.0–34.0)
MCHC: 32.9 g/dL (ref 30.0–36.0)
MCV: 91.7 fL (ref 80.0–100.0)
Platelets: 294 10*3/uL (ref 150–400)
RBC: 4.71 MIL/uL (ref 3.87–5.11)
RDW: 12.9 % (ref 11.5–15.5)
WBC: 10.4 10*3/uL (ref 4.0–10.5)
nRBC: 0 % (ref 0.0–0.2)

## 2023-04-18 LAB — POCT PREGNANCY, URINE: Preg Test, Ur: NEGATIVE

## 2023-04-18 LAB — TYPE AND SCREEN
ABO/RH(D): O NEG
Antibody Screen: NEGATIVE

## 2023-04-18 SURGERY — DILATATION & CURETTAGE/HYSTEROSCOPY WITH MYOSURE
Anesthesia: General

## 2023-04-18 MED ORDER — ONDANSETRON HCL 4 MG/2ML IJ SOLN
INTRAMUSCULAR | Status: DC | PRN
Start: 2023-04-18 — End: 2023-04-18
  Administered 2023-04-18: 4 mg via INTRAVENOUS

## 2023-04-18 MED ORDER — LIDOCAINE HCL (PF) 2 % IJ SOLN
INTRAMUSCULAR | Status: AC
  Filled 2023-04-18: qty 5

## 2023-04-18 MED ORDER — DEXMEDETOMIDINE HCL IN NACL 80 MCG/20ML IV SOLN
INTRAVENOUS | Status: DC | PRN
  Administered 2023-04-18: 12 ug via INTRAVENOUS

## 2023-04-18 MED ORDER — FENTANYL CITRATE (PF) 100 MCG/2ML IJ SOLN
INTRAMUSCULAR | Status: DC | PRN
  Administered 2023-04-18: 50 ug via INTRAVENOUS
  Administered 2023-04-18 (×2): 25 ug via INTRAVENOUS

## 2023-04-18 MED ORDER — ONDANSETRON HCL 4 MG/2ML IJ SOLN
INTRAMUSCULAR | Status: AC
  Filled 2023-04-18: qty 2

## 2023-04-18 MED ORDER — FENTANYL CITRATE (PF) 100 MCG/2ML IJ SOLN
INTRAMUSCULAR | Status: AC
  Filled 2023-04-18: qty 2

## 2023-04-18 MED ORDER — PROPOFOL 10 MG/ML IV BOLUS
INTRAVENOUS | Status: DC | PRN
Start: 2023-04-18 — End: 2023-04-18
  Administered 2023-04-18: 200 mg via INTRAVENOUS

## 2023-04-18 MED ORDER — ARTIFICIAL TEARS OPHTHALMIC OINT
TOPICAL_OINTMENT | OPHTHALMIC | Status: AC
  Filled 2023-04-18: qty 3.5

## 2023-04-18 MED ORDER — DEXAMETHASONE SODIUM PHOSPHATE 10 MG/ML IJ SOLN
INTRAMUSCULAR | Status: DC | PRN
  Administered 2023-04-18: 10 mg via INTRAVENOUS

## 2023-04-18 MED ORDER — DEXAMETHASONE SODIUM PHOSPHATE 10 MG/ML IJ SOLN
INTRAMUSCULAR | Status: AC
  Filled 2023-04-18: qty 1

## 2023-04-18 MED ORDER — ROCURONIUM BROMIDE 10 MG/ML (PF) SYRINGE
PREFILLED_SYRINGE | INTRAVENOUS | Status: AC
  Filled 2023-04-18: qty 10

## 2023-04-18 MED ORDER — PROPOFOL 10 MG/ML IV BOLUS
INTRAVENOUS | Status: AC
  Filled 2023-04-18: qty 20

## 2023-04-18 MED ORDER — IBUPROFEN 600 MG PO TABS: 600.0000 mg | ORAL_TABLET | Freq: Four times a day (QID) | ORAL | 0 refills | Status: AC | PRN

## 2023-04-18 MED ORDER — OXYCODONE-ACETAMINOPHEN 5-325 MG PO TABS: 1.0000 | ORAL_TABLET | ORAL | 0 refills | Status: AC | PRN

## 2023-04-18 MED ORDER — SODIUM CHLORIDE 0.9 % IR SOLN
Status: DC | PRN
  Administered 2023-04-18: 3000 mL

## 2023-04-18 MED ORDER — FENTANYL CITRATE (PF) 100 MCG/2ML IJ SOLN: 25.0000 ug | INTRAMUSCULAR | Status: DC | PRN

## 2023-04-18 MED ORDER — MIDAZOLAM HCL 2 MG/2ML IJ SOLN
INTRAMUSCULAR | Status: AC
  Filled 2023-04-18: qty 2

## 2023-04-18 MED ORDER — EPHEDRINE 5 MG/ML INJ
INTRAVENOUS | Status: AC
  Filled 2023-04-18: qty 5

## 2023-04-18 MED ORDER — MIDAZOLAM HCL 5 MG/5ML IJ SOLN
INTRAMUSCULAR | Status: DC | PRN
  Administered 2023-04-18: 2 mg via INTRAVENOUS

## 2023-04-18 MED ORDER — POVIDONE-IODINE 10 % EX SWAB: 2.0000 | Freq: Once | CUTANEOUS | Status: DC

## 2023-04-18 MED ORDER — OXYCODONE HCL 5 MG/5ML PO SOLN: 5.0000 mg | Freq: Once | ORAL | Status: DC | PRN

## 2023-04-18 MED ORDER — KETOROLAC TROMETHAMINE 30 MG/ML IJ SOLN
INTRAMUSCULAR | Status: DC | PRN
  Administered 2023-04-18: 30 mg via INTRAVENOUS

## 2023-04-18 MED ORDER — KETOROLAC TROMETHAMINE 30 MG/ML IJ SOLN
INTRAMUSCULAR | Status: AC
  Filled 2023-04-18: qty 1

## 2023-04-18 MED ORDER — LIDOCAINE 2% (20 MG/ML) 5 ML SYRINGE
INTRAMUSCULAR | Status: DC | PRN
  Administered 2023-04-18: 60 mg via INTRAVENOUS

## 2023-04-18 MED ORDER — ONDANSETRON HCL 4 MG/2ML IJ SOLN: 4.0000 mg | Freq: Four times a day (QID) | INTRAMUSCULAR | Status: DC | PRN

## 2023-04-18 MED ORDER — OXYCODONE HCL 5 MG PO TABS: 5.0000 mg | ORAL_TABLET | Freq: Once | ORAL | Status: DC | PRN

## 2023-04-18 MED ORDER — DEXMEDETOMIDINE HCL IN NACL 80 MCG/20ML IV SOLN
INTRAVENOUS | Status: AC
  Filled 2023-04-18: qty 20

## 2023-04-18 MED ORDER — LACTATED RINGERS IV SOLN
INTRAVENOUS | Status: DC
  Administered 2023-04-18: 1000 mL via INTRAVENOUS

## 2023-04-18 MED ORDER — LACTATED RINGERS IV SOLN: INTRAVENOUS | Status: DC

## 2023-04-18 SURGICAL SUPPLY — 23 items
CATH ROBINSON RED A/P 16FR (CATHETERS) ×1 IMPLANT
DEVICE MYOSURE LITE (MISCELLANEOUS) IMPLANT
DEVICE MYOSURE REACH (MISCELLANEOUS) IMPLANT
DILATOR CANAL MILEX (MISCELLANEOUS) IMPLANT
DRSG TELFA 3X8 NADH STRL (GAUZE/BANDAGES/DRESSINGS) ×1 IMPLANT
GAUZE 4X4 16PLY ~~LOC~~+RFID DBL (SPONGE) ×2 IMPLANT
GLOVE BIO SURGEON STRL SZ 6 (GLOVE) ×1 IMPLANT
GLOVE BIOGEL PI IND STRL 6 (GLOVE) ×1 IMPLANT
GLOVE ECLIPSE 6.0 STRL STRAW (GLOVE) ×1 IMPLANT
GLOVE SS PI 5.5 STRL (GLOVE) ×1 IMPLANT
GOWN STRL REUS W/TWL LRG LVL3 (GOWN DISPOSABLE) ×1 IMPLANT
IV NS IRRIG 3000ML ARTHROMATIC (IV SOLUTION) ×1 IMPLANT
KIT PROCEDURE FLUENT (KITS) ×1 IMPLANT
KIT TURNOVER CYSTO (KITS) ×1 IMPLANT
LOOP CUTTING BIPOLAR 21FR (ELECTRODE) IMPLANT
MYOSURE XL FIBROID (MISCELLANEOUS)
PACK VAGINAL MINOR WOMEN LF (CUSTOM PROCEDURE TRAY) ×1 IMPLANT
PAD OB MATERNITY 4.3X12.25 (PERSONAL CARE ITEMS) ×1 IMPLANT
SEAL CERVICAL OMNI LOK (ABLATOR) IMPLANT
SEAL ROD LENS SCOPE MYOSURE (ABLATOR) ×1 IMPLANT
SLEEVE SCD COMPRESS KNEE MED (STOCKING) ×1 IMPLANT
SYSTEM TISS REMOVAL MYOSURE XL (MISCELLANEOUS) IMPLANT
WATER STERILE IRR 500ML POUR (IV SOLUTION) ×1 IMPLANT

## 2023-04-18 NOTE — Discharge Instructions (Addendum)
Call MD for T>100.4, heavy vaginal bleeding, severe abdominal pain, intractable nausea and/or vomiting, or respiratory distress.  Call office to schedule postop visit in 2 weeks.  Pelvic rest x 4 weeks.  No driving while taking narcotics.        No ibuprofen, Advil, Aleve, Motrin, ketorolac, meloxicam, naproxen, or other NSAIDS until after 2:15 pm today if needed.   Post Anesthesia Home Care Instructions  Activity: Get plenty of rest for the remainder of the day. A responsible individual must stay with you for 24 hours following the procedure.  For the next 24 hours, DO NOT: -Drive a car -Advertising copywriter -Drink alcoholic beverages -Take any medication unless instructed by your physician -Make any legal decisions or sign important papers.  Meals: Start with liquid foods such as gelatin or soup. Progress to regular foods as tolerated. Avoid greasy, spicy, heavy foods. If nausea and/or vomiting occur, drink only clear liquids until the nausea and/or vomiting subsides. Call your physician if vomiting continues.  Special Instructions/Symptoms: Your throat may feel dry or sore from the anesthesia or the breathing tube placed in your throat during surgery. If this causes discomfort, gargle with warm salt water. The discomfort should disappear within 24 hours.

## 2023-04-18 NOTE — Progress Notes (Signed)
No change in H&P.  Megan Morris, DO 

## 2023-04-18 NOTE — Transfer of Care (Signed)
Immediate Anesthesia Transfer of Care Note  Patient: Gina Moreno  Procedure(s) Performed: DILATATION & CURETTAGE/HYSTEROSCOPY WITH MYOSURE  Patient Location: PACU  Anesthesia Type:General  Level of Consciousness: awake, alert , oriented, and patient cooperative  Airway & Oxygen Therapy: Patient Spontanous Breathing  Post-op Assessment: Report given to RN and Post -op Vital signs reviewed and stable  Post vital signs: Reviewed and stable  Last Vitals:  Vitals Value Taken Time  BP 125/74 04/18/23 0821  Temp    Pulse 93 04/18/23 0822  Resp 13 04/18/23 0822  SpO2 96 % 04/18/23 0822  Vitals shown include unfiled device data.  Last Pain:  Vitals:   04/18/23 0606  TempSrc: Oral  PainSc: 0-No pain         Complications: No notable events documented.

## 2023-04-18 NOTE — Op Note (Signed)
PREOPERATIVE DIAGNOSIS:  37 y.o. with irregular uterine bleeding, submucosal fibroid  POSTOPERATIVE DIAGNOSIS: The same  PROCEDURE: Hysteroscopy, Dilation and Curettage, Myosure REACH  SURGEON:  Dr. Mitchel Honour  INDICATIONS: 37 y.o. [id number]  here for scheduled surgery for treatment of submucosal fibroid.   Risks of surgery were discussed with the patient including but not limited to: bleeding which may require transfusion; infection which may require antibiotics; injury to uterus or surrounding organs; intrauterine scarring which may impair future fertility; need for additional procedures including laparotomy or laparoscopy; and other postoperative/anesthesia complications. Written informed consent was obtained.    FINDINGS:  A 8 week size uterus.  Thin endometrium.  Normal ostia bilaterally.  Submucosal fibroid   ANESTHESIA:   General  ESTIMATED BLOOD LOSS:  Less than 20 ml  SPECIMENS: Endometrial curettings sent to pathology  COMPLICATIONS:  None immediate.  PROCEDURE DETAILS:  The patient was taken to the operating room where general anesthesia was administered and was found to be adequate.  After an adequate timeout was performed, she was placed in the dorsal lithotomy position and examined; then prepped and draped in the sterile manner.   Her bladder was catheterized for an unmeasured amount of clear, yellow urine. A speculum was then placed in the patient's vagina and a single tooth tenaculum was applied to the anterior lip of the cervix.   The uteus was sounded to 8 cm and dilated manually with metal dilators to accommodate the operative hysteroscope.  Once the cervix was dilated, the hysteroscope was inserted under direct visualization using saline as a suspension medium.  The uterine cavity was carefully examined, both ostia were recognized. Submucosal fibroid was noted.  The Myosure REACH was opened and advanced into the uterine cavity.  Using the Myosure, the fibroid was entirely  removed.  After further careful visualization of the uterine cavity, the hysteroscope was removed under direct visualization.  A sharp curettage was then performed to obtain a scant amount of endometrial curettings.  The tenaculum was removed from the anterior lip of the cervix and the vaginal speculum was removed after noting good hemostasis.  The patient tolerated the procedure well and was taken to the recovery area awake, extubated and in stable condition.

## 2023-04-18 NOTE — Anesthesia Procedure Notes (Signed)
Procedure Name: LMA Insertion Date/Time: 04/18/2023 7:52 AM  Performed by: Bishop Limbo, CRNAPre-anesthesia Checklist: Patient identified, Emergency Drugs available, Suction available and Patient being monitored Patient Re-evaluated:Patient Re-evaluated prior to induction Oxygen Delivery Method: Circle System Utilized Preoxygenation: Pre-oxygenation with 100% oxygen Induction Type: IV induction Ventilation: Mask ventilation without difficulty LMA: LMA inserted LMA Size: 4.0 Number of attempts: 1 Placement Confirmation: positive ETCO2 Tube secured with: Tape Dental Injury: Teeth and Oropharynx as per pre-operative assessment

## 2023-04-18 NOTE — Anesthesia Postprocedure Evaluation (Signed)
Anesthesia Post Note  Patient: Gina Moreno  Procedure(s) Performed: DILATATION & CURETTAGE/HYSTEROSCOPY WITH MYOSURE     Patient location during evaluation: PACU Anesthesia Type: General Level of consciousness: awake and alert Pain management: pain level controlled Vital Signs Assessment: post-procedure vital signs reviewed and stable Respiratory status: spontaneous breathing, nonlabored ventilation, respiratory function stable and patient connected to nasal cannula oxygen Cardiovascular status: blood pressure returned to baseline and stable Postop Assessment: no apparent nausea or vomiting Anesthetic complications: no   No notable events documented.  Last Vitals:  Vitals:   04/18/23 0900 04/18/23 0930  BP: 124/87 135/87  Pulse: 85 86  Resp: 13 14  Temp:  37.1 C  SpO2: 96% 98%    Last Pain:  Vitals:   04/18/23 0930  TempSrc:   PainSc: 0-No pain                 Amamda Curbow S

## 2023-04-18 NOTE — Anesthesia Preprocedure Evaluation (Signed)
Anesthesia Evaluation  Patient identified by MRN, date of birth, ID band Patient awake    Reviewed: Allergy & Precautions, H&P , NPO status , Patient's Chart, lab work & pertinent test results  Airway Mallampati: II   Neck ROM: full    Dental   Pulmonary neg pulmonary ROS   breath sounds clear to auscultation       Cardiovascular  Rhythm:regular Rate:Normal     Neuro/Psych  Headaches PSYCHIATRIC DISORDERS Anxiety Depression       GI/Hepatic ,GERD  ,,IBS   Endo/Other    Renal/GU      Musculoskeletal   Abdominal   Peds  Hematology   Anesthesia Other Findings   Reproductive/Obstetrics Abnormal uterine bleeding                             Anesthesia Physical Anesthesia Plan  ASA: 2  Anesthesia Plan: General   Post-op Pain Management:    Induction: Intravenous  PONV Risk Score and Plan: 3 and Ondansetron, Dexamethasone, Midazolam and Treatment may vary due to age or medical condition  Airway Management Planned: LMA  Additional Equipment:   Intra-op Plan:   Post-operative Plan: Extubation in OR  Informed Consent: I have reviewed the patients History and Physical, chart, labs and discussed the procedure including the risks, benefits and alternatives for the proposed anesthesia with the patient or authorized representative who has indicated his/her understanding and acceptance.     Dental advisory given  Plan Discussed with: CRNA, Anesthesiologist and Surgeon  Anesthesia Plan Comments:        Anesthesia Quick Evaluation

## 2023-04-23 ENCOUNTER — Encounter (HOSPITAL_BASED_OUTPATIENT_CLINIC_OR_DEPARTMENT_OTHER): Payer: Self-pay | Admitting: Obstetrics & Gynecology

## 2023-04-25 LAB — SURGICAL PATHOLOGY

## 2023-05-09 ENCOUNTER — Other Ambulatory Visit: Payer: Self-pay | Admitting: Podiatry

## 2023-09-29 ENCOUNTER — Other Ambulatory Visit: Payer: Self-pay | Admitting: Family Medicine

## 2023-09-30 NOTE — Telephone Encounter (Signed)
 10/17/2022 LOV  10/08/2022 Fill Date  90/3 refills

## 2023-10-21 ENCOUNTER — Encounter: Payer: Self-pay | Admitting: Family Medicine

## 2023-10-21 ENCOUNTER — Ambulatory Visit (INDEPENDENT_AMBULATORY_CARE_PROVIDER_SITE_OTHER): Payer: BC Managed Care – PPO | Admitting: Family Medicine

## 2023-10-21 VITALS — BP 122/57 | HR 76 | Temp 97.8°F | Ht 67.0 in | Wt 196.2 lb

## 2023-10-21 DIAGNOSIS — R030 Elevated blood-pressure reading, without diagnosis of hypertension: Secondary | ICD-10-CM

## 2023-10-21 DIAGNOSIS — Z3041 Encounter for surveillance of contraceptive pills: Secondary | ICD-10-CM | POA: Diagnosis not present

## 2023-10-21 DIAGNOSIS — Z0001 Encounter for general adult medical examination with abnormal findings: Secondary | ICD-10-CM

## 2023-10-21 DIAGNOSIS — F411 Generalized anxiety disorder: Secondary | ICD-10-CM | POA: Diagnosis not present

## 2023-10-21 DIAGNOSIS — G43009 Migraine without aura, not intractable, without status migrainosus: Secondary | ICD-10-CM

## 2023-10-21 DIAGNOSIS — Z86018 Personal history of other benign neoplasm: Secondary | ICD-10-CM | POA: Insufficient documentation

## 2023-10-21 DIAGNOSIS — K580 Irritable bowel syndrome with diarrhea: Secondary | ICD-10-CM

## 2023-10-21 NOTE — Patient Instructions (Signed)
Please return in 12 months for your annual complete physical; please come fasting.   I will release your lab results to you on your MyChart account with further instructions. You may see the results before I do, but when I review them I will send you a message with my report or have my assistant call you if things need to be discussed. Please reply to my message with any questions. Thank you!   If you have any questions or concerns, please don't hesitate to send me a message via MyChart or call the office at [phone]. Thank you for visiting with [place] today! It's our pleasure caring for you.   Preventive Care 36-36 Years Old, Female Preventive care refers to lifestyle choices and visits with your health care provider that can promote health and wellness. Preventive care visits are also called wellness exams. What can I expect for my preventive care visit? Counseling During your preventive care visit, your health care provider may ask about your: Medical history, including: Past medical problems. Family medical history. Pregnancy history. Current health, including: Menstrual cycle. Method of birth control. Emotional well-being. Home life and relationship well-being. Sexual activity and sexual health. Lifestyle, including: Alcohol, nicotine or tobacco, and drug use. Access to firearms. Diet, exercise, and sleep habits. Work and work Astronomer. Sunscreen use. Safety issues such as seatbelt and bike helmet use. Physical exam Your health care provider may check your: Height and weight. These may be used to calculate your BMI (body mass index). BMI is a measurement that tells if you are at a healthy weight. Waist circumference. This measures the distance around your waistline. This measurement also tells if you are at a healthy weight and may help predict your risk of certain diseases, such as type 2 diabetes and high blood pressure. Heart rate and blood pressure. Body  temperature. Skin for abnormal spots. What immunizations do I need?  Vaccines are usually given at various ages, according to a schedule. Your health care provider will recommend vaccines for you based on your age, medical history, and lifestyle or other factors, such as travel or where you work. What tests do I need? Screening Your health care provider may recommend screening tests for certain conditions. This may include: Pelvic exam and Pap test. Lipid and cholesterol levels. Diabetes screening. This is done by checking your blood sugar (glucose) after you have not eaten for a while (fasting). Hepatitis B test. Hepatitis C test. HIV (human immunodeficiency virus) test. STI (sexually transmitted infection) testing, if you are at risk. BRCA-related cancer screening. This may be done if you have a family history of breast, ovarian, tubal, or peritoneal cancers. Talk with your health care provider about your test results, treatment options, and if necessary, the need for more tests. Follow these instructions at home: Eating and drinking  Eat a healthy diet that includes fresh fruits and vegetables, whole grains, lean protein, and low-fat dairy products. Take vitamin and mineral supplements as recommended by your health care provider. Do not drink alcohol if: Your health care provider tells you not to drink. You are pregnant, may be pregnant, or are planning to become pregnant. If you drink alcohol: Limit how much you have to 0-1 drink a day. Know how much alcohol is in your drink. In the [place] one drink equals one 12 oz bottle of beer (355 mL), one 5 oz glass of wine (148 mL), or one 1 oz glass of hard liquor (44 mL). Lifestyle Brush your teeth every morning and  night with fluoride toothpaste. Floss one time each day. Exercise for at least 30 minutes 5 or more days each week. Do not use any products that contain nicotine or tobacco. These products include cigarettes, chewing tobacco,  and vaping devices, such as e-cigarettes. If you need help quitting, ask your health care provider. Do not use drugs. If you are sexually active, practice safe sex. Use a condom or other form of protection to prevent STIs. If you do not wish to become pregnant, use a form of birth control. If you plan to become pregnant, see your health care provider for a prepregnancy visit. Find healthy ways to manage stress, such as: Meditation, yoga, or listening to music. Journaling. Talking to a trusted person. Spending time with friends and family. Minimize exposure to UV radiation to reduce your risk of skin cancer. Safety Always wear your seat belt while driving or riding in a vehicle. Do not drive: If you have been drinking alcohol. Do not ride with someone who has been drinking. If you have been using any mind-altering substances or drugs. While texting. When you are tired or distracted. Wear a helmet and other protective equipment during sports activities. If you have firearms in your house, make sure you follow all gun safety procedures. Seek help if you have been physically or sexually abused. What's next? Go to your health care provider once a year for an annual wellness visit. Ask your health care provider how often you should have your eyes and teeth checked. Stay up to date on all vaccines. This information is not intended to replace advice given to you by your health care provider. Make sure you discuss any questions you have with your health care provider. Document Revised: 02/01/2021 Document Reviewed: 02/01/2021 Elsevier Patient Education  2024 [employer].

## 2023-10-21 NOTE — Addendum Note (Signed)
 Addended by: Trudie Reed A on: 10/21/2023 09:53 AM   Modules accepted: Orders

## 2023-10-21 NOTE — Progress Notes (Signed)
 Subjective  Chief Complaint  Patient presents with   Annual Exam   Anxiety    HPI: Gina Moreno is a 38 y.o. female who presents to [facility] today for a Female Wellness Visit.  She also has the concerns and/or needs as listed above in the chief complaint. These will be addressed in addition to the Health Maintenance Visit.   Wellness Visit: annual visit with health maintenance review and exam HM: sees gyn; pap current. Had hysteroscopy and myosure due to submucosal fibroids in August 2024.  She is now taking progesterone only birth control pill.  Tolerating well.  Some irregular bleeding.  Doing well overall.  Both sons now go-cart racing and thriving.  Ages 33 and 11.  Patient is happy and healthy.  Declines vaccinations at this time. Chronic disease management visit and/or acute problem visit: Elevated blood pressure in the office.  Discussed last year.  Patient takes home readings.  Blood pressures for the last 3 days are 120s over 50s to 60s.  Feels well.  No family history of blood pressure problems. Migraines are well-controlled. Generalized anxiety disorder is well-controlled on Prozac 20 mg daily.  No concerns IBS and stable  Assessment  1. Encounter for well adult exam with abnormal findings   2. GAD (generalized anxiety disorder)   3. Migraine without aura and responsive to treatment   4. Irritable bowel syndrome with diarrhea   5. Oral contraceptive use   6. White coat syndrome without diagnosis of hypertension   7. History of uterine fibroid      Plan  Female Wellness Visit: Age appropriate Health Maintenance and Prevention measures were discussed with patient. Included topics are cancer screening recommendations, ways to keep healthy (see AVS) including dietary and exercise recommendations, regular eye and dental care, use of seat belts, and avoidance of moderate alcohol use and tobacco use.  Screens are current BMI: discussed patient's  BMI and encouraged positive lifestyle modifications to help get to or maintain a target BMI. HM needs and immunizations were addressed and ordered. See below for orders. See HM and immunization section for updates. Routine labs and screening tests ordered including cmp, cbc and lipids where appropriate. Discussed recommendations regarding Vit D and calcium supplementation (see AVS)  Chronic disease f/u and/or acute problem visit: (deemed necessary to be done in addition to the wellness visit): Whitecoat hypertension: Education given.  Continue home monitoring. Generalized anxiety disorder well-controlled on Prozac 20 mg daily Migraines are well-controlled with as needed abortives  Follow up: 12 months for complete physical  No orders of the defined types were placed in this encounter.  No orders of the defined types were placed in this encounter.      Body mass index is 30.73 kg/m. Wt Readings from Last 3 Encounters:  10/21/23 196 lb 3.2 oz (89 kg)  04/18/23 197 lb 9.6 oz (89.6 kg)  10/17/22 196 lb 3.2 oz (89 kg)     Patient Active Problem List   Diagnosis Date Noted Date Diagnosed   Oral contraceptive use 03/10/2018     Priority: High    Switched from COC to progesteron only minipill 07/2023 due to borderline bp and h/o fibroids by GYN    Migraine without aura and responsive to treatment 03/22/2015     Priority: High    Mild; treated with advil; associated with menses at times, monthly    GAD (generalized anxiety disorder) 07/25/2013     Priority: High  Has failed, Cymbalta, Paxil, Wellbutrin, Zoloft and Lexapro, Effexor, Prozac Retried prozac 07/2021 with good response.     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 Doing ok.     GERD (gastroesophageal reflux disease) 07/25/2013     Priority: Medium    Flow murmur 05/18/2019     Priority: Low   Seasonal allergic rhinitis due to pollen 03/10/2018     Priority: Low   Health Maintenance  Topic Date Due   COVID-19 Vaccine (1) 11/06/2023 (Originally 06/13/1993)   INFLUENZA VACCINE  11/18/2023 (Originally 03/21/2023)   DTaP/Tdap/Td (8 - Td or Tdap) 01/08/2027   Cervical Cancer Screening (HPV/Pap Cotest)  10/12/2027   HPV VACCINES  Completed   Hepatitis C Screening  Completed   HIV Screening  Completed   Immunization History  Administered Date(s) Administered   DTaP 08/16/1988, 10/09/1988, 12/07/1988, 10/17/1990, 10/15/1993   HIB (PRP-OMP) 09/17/1989   HPV Quadrivalent 10/19/2006, 01/19/2007, 04/21/2007   Hepatitis B 05/14/2000, 06/18/2000, 11/13/2000   IPV 08/16/1988, 10/09/1988, 01/12/1994   Influenza,inj,Quad PF,6+ Mos 07/23/2013, 05/20/2014   MMR 09/17/1989, 10/15/1993   Rho (D) Immune Globulin 03/15/2013   Td 08/11/2002   Tdap 01/07/2017   We updated and reviewed the patient's past history in detail and it is documented below. Allergies: Patient  reports no history of alcohol use. Past Medical History Patient  has a past medical history of Anemia (07/25/2013), Anxiety, Anxiety and depression (07/25/2013), Depression, Esophageal reflux (07/25/2013), Flow murmur (05/18/2019), GERD (gastroesophageal reflux disease), Heart murmur, History of benign breast tumor, varicella, IBS (irritable bowel syndrome), Migraine (03/22/2015), and RLS (restless legs syndrome) (05/20/2014). Past Surgical History Patient  has a past surgical history that includes Wisdom tooth extraction (38 yrs old); Cholecystectomy  (N/A, 10/25/2015); Excision of breast biopsy (Right, 09/01/2018); Dilatation & curettage/hysteroscopy with myosure (N/A, 04/18/2023); and Breast surgery (2020). Social History   Socioeconomic History   Marital status: Married    Spouse name: Not on file   Number of children: 2   Years of education: Not on file   Highest education level: Not on file  Occupational History   Not on file  Tobacco Use   Smoking status: Never   Smokeless tobacco: Never  Vaping Use   Vaping status: Never Used  Substance and Sexual Activity   Alcohol use: No   Drug use: No   Sexual activity: Yes    Partners: Male    Birth control/protection: Pill  Other Topics Concern   Not on file  Social History Narrative   Not on file   Social Drivers of Health   Financial Resource Strain: Not on file  Food Insecurity: Not on file  Transportation Needs: Not on file  Physical Activity: Not on file  Stress: Not on file  Social Connections: Not on file   Family History  Problem Relation Age of Onset   Hyperlipidemia Father    Arthritis Paternal Grandmother  Cancer Paternal Grandmother        cervical   Diabetes Paternal Grandmother    Depression Paternal Grandmother    Cancer Paternal Grandfather        lung stomach   Thyroid disease Mother    Anxiety disorder Mother    ADD / ADHD Son     Review of Systems: Constitutional: negative for fever or malaise Ophthalmic: negative for photophobia, double vision or loss of vision Cardiovascular: negative for chest pain, dyspnea on exertion, or new LE swelling Respiratory: negative for SOB or persistent cough Gastrointestinal: negative for abdominal pain, change in bowel habits or melena Genitourinary: negative for dysuria or gross hematuria, no abnormal uterine bleeding or disharge Musculoskeletal: negative for new gait disturbance or muscular weakness Integumentary: negative for new or persistent rashes, no breast lumps Neurological: negative for TIA or  stroke symptoms Psychiatric: negative for SI or delusions Allergic/Immunologic: negative for hives  Patient Care Team    Relationship Specialty Notifications Start End  Willow Ora, MD PCP - General Family Medicine  03/10/18     Objective  Vitals: BP (!) 122/57 Comment: home reading this morning  Pulse 76   Temp 97.8 F (36.6 C)   Ht 5\' 7"  (1.702 m)   Wt 196 lb 3.2 oz (89 kg)   SpO2 100%   BMI 30.73 kg/m  General:  Well developed, well nourished, no acute distress  Psych:  Alert and orientedx3,normal mood and affect HEENT:  Normocephalic, atraumatic, non-icteric sclera, PERRL, supple neck without adenopathy, mass or thyromegaly Cardiovascular:  Normal S1, S2, RRR without gallop, rub or murmur Respiratory:  Good breath sounds bilaterally, CTAB with normal respiratory effort Gastrointestinal: normal bowel sounds, soft, non-tender, no noted masses. No HSM MSK: no deformities, contusions. Joints are without erythema or swelling.  Skin:  Warm, no rashes or suspicious lesions noted Neurologic:    Mental status is normal. Gross motor and sensory exams are normal. Normal gait. No tremor    Commons side effects, risks, benefits, and alternatives for medications and treatment plan prescribed today were discussed, and the patient expressed understanding of the given instructions. Patient is instructed to call or message via MyChart if he/she has any questions or concerns regarding our treatment plan. No barriers to understanding were identified. We discussed Red Flag symptoms and signs in detail. Patient expressed understanding regarding what to do in case of urgent or emergency type symptoms.  Medication list was reconciled, printed and provided to the patient in AVS. Patient instructions and summary information was reviewed with the patient as documented in the AVS. This note was prepared with assistance of Dragon voice recognition software. Occasional wrong-word or sound-a-like  substitutions may have occurred due to the inherent limitations of voice recognition software .

## 2023-10-22 ENCOUNTER — Encounter: Payer: Self-pay | Admitting: Family Medicine

## 2023-10-22 LAB — CBC WITH DIFFERENTIAL/PLATELET
Absolute Lymphocytes: 3212 {cells}/uL (ref 850–3900)
Absolute Monocytes: 466 {cells}/uL (ref 200–950)
Basophils Absolute: 26 {cells}/uL (ref 0–200)
Basophils Relative: 0.3 %
Eosinophils Absolute: 185 {cells}/uL (ref 15–500)
Eosinophils Relative: 2.1 %
HCT: 40.9 % (ref 35.0–45.0)
Hemoglobin: 13.4 g/dL (ref 11.7–15.5)
MCH: 29.5 pg (ref 27.0–33.0)
MCHC: 32.8 g/dL (ref 32.0–36.0)
MCV: 89.9 fL (ref 80.0–100.0)
MPV: 11 fL (ref 7.5–12.5)
Monocytes Relative: 5.3 %
Neutro Abs: 4910 {cells}/uL (ref 1500–7800)
Neutrophils Relative %: 55.8 %
Platelets: 281 10*3/uL (ref 140–400)
RBC: 4.55 10*6/uL (ref 3.80–5.10)
RDW: 12.5 % (ref 11.0–15.0)
Total Lymphocyte: 36.5 %
WBC: 8.8 10*3/uL (ref 3.8–10.8)

## 2023-10-22 LAB — COMPREHENSIVE METABOLIC PANEL
AG Ratio: 1.6 (calc) (ref 1.0–2.5)
ALT: 16 U/L (ref 6–29)
AST: 13 U/L (ref 10–30)
Albumin: 4.4 g/dL (ref 3.6–5.1)
Alkaline phosphatase (APISO): 73 U/L (ref 31–125)
BUN: 16 mg/dL (ref 7–25)
CO2: 23 mmol/L (ref 20–32)
Calcium: 9.7 mg/dL (ref 8.6–10.2)
Chloride: 105 mmol/L (ref 98–110)
Creat: 0.6 mg/dL (ref 0.50–0.97)
Globulin: 2.7 g/dL (ref 1.9–3.7)
Glucose, Bld: 90 mg/dL (ref 65–99)
Potassium: 4.2 mmol/L (ref 3.5–5.3)
Sodium: 139 mmol/L (ref 135–146)
Total Bilirubin: 0.7 mg/dL (ref 0.2–1.2)
Total Protein: 7.1 g/dL (ref 6.1–8.1)

## 2023-10-22 LAB — TSH: TSH: 1.42 m[IU]/L

## 2023-10-22 LAB — LIPID PANEL
Cholesterol: 180 mg/dL (ref <200)
HDL: 63 mg/dL (ref >=50)
LDL Cholesterol (Calc): 101 mg/dL — ABNORMAL HIGH
Non-HDL Cholesterol (Calc): 117 mg/dL (ref <130)
Total CHOL/HDL Ratio: 2.9 (calc) (ref <5.0)
Triglycerides: 69 mg/dL (ref <150)

## 2023-10-22 NOTE — Progress Notes (Signed)
 Labs reviewed. Dear Ms. Gina Moreno, Thank you for allowing me to care for you at your recent office visit.  I wanted to let you know that I have reviewed your lab test results and am happy to report that they are normal.  Everything looks good!  Sincerely, Dr. Mardelle Matte

## 2023-12-26 ENCOUNTER — Other Ambulatory Visit (HOSPITAL_COMMUNITY): Payer: Self-pay

## 2023-12-26 ENCOUNTER — Telehealth: Payer: Self-pay

## 2023-12-26 NOTE — Telephone Encounter (Signed)
 Copied from CRM [id number]. Topic: Clinical - Prescription Issue >> Dec 26, 2023 11:50 AM Gina Moreno wrote: Reason for CRM: Patient called in stating insurance advised her a Prior authorization for insurance to cover medication FLUoxetine  (PROZAC ) 20 MG tablet and also need it written for every 3 months.  Please run PA

## 2023-12-30 ENCOUNTER — Other Ambulatory Visit (HOSPITAL_COMMUNITY): Payer: Self-pay

## 2023-12-30 ENCOUNTER — Telehealth: Payer: Self-pay

## 2023-12-30 NOTE — Telephone Encounter (Signed)
 Pharmacy Patient Advocate Encounter   Received notification from Pt Calls Messages that prior authorization for Fluoxetine  20mg  tablets is required/requested.   Insurance verification completed.   The patient is insured through [facility] .   Per test claim:  Fluoxetine  20mg  capsules is preferred by the insurance.  If suggested medication is appropriate, Please send in a new RX and discontinue this one. If not, please advise as to why it's not appropriate so that we may request a Prior Authorization. Please note, some preferred medications may still require a PA.  If the suggested medications have not been trialed and there are no contraindications to their use, the PA will not be submitted, as it will not be approved.

## 2023-12-30 NOTE — Telephone Encounter (Signed)
 Noted.

## 2024-01-01 ENCOUNTER — Other Ambulatory Visit: Payer: Self-pay | Admitting: Family Medicine

## 2024-01-01 NOTE — Telephone Encounter (Signed)
 Last sent 09/30/23 for 90 days with three refills.   Called patient to let her know that refills remain on the prescription sent in February, she states [facility] called [facility] and they told her to call PCP because Prozac  refills need prior auth from insurance. States refills are on hold. Gina Moreno requesting call back from office to let her know when this is straightened out.

## 2024-01-01 NOTE — Telephone Encounter (Signed)
 Copied from CRM [id number]. Topic: Clinical - Medication Refill >> Jan 01, 2024 10:36 AM Gina Moreno wrote: Medication: FLUoxetine  (PROZAC ) 20 MG tablet  Has the patient contacted their pharmacy? Yes (Agent: If no, request that the patient contact the pharmacy for the refill. If patient does not wish to contact the pharmacy document the reason why and proceed with request.) (Agent: If yes, when and what did the pharmacy advise?)  This is the patient's preferred pharmacy:  [id number] [place] [place] - [address] [place] [address] Phone: [phone] Fax: [phone]  Is this the correct pharmacy for this prescription? Yes If no, delete pharmacy and type the correct one.   Has the prescription been filled recently? Yes  Is the patient out of the medication? Yes  Has the patient been seen for an appointment in the last year OR does the patient have an upcoming appointment? Yes  Can we respond through MyChart? Yes  Agent: Please be advised that Rx refills may take up to 3 business days. We ask that you follow-up with your pharmacy.

## 2024-01-02 NOTE — Telephone Encounter (Signed)
 10/21/2023 LOV   09/30/2023 fill date  90/3 refills

## 2024-01-03 ENCOUNTER — Encounter: Payer: Self-pay | Admitting: Family Medicine

## 2024-01-06 MED ORDER — FLUOXETINE HCL 20 MG PO TABS: 20.0000 mg | ORAL_TABLET | Freq: Every day | ORAL | 3 refills | Status: AC

## 2024-04-29 ENCOUNTER — Telehealth: Admitting: Physician Assistant

## 2024-04-29 DIAGNOSIS — R3989 Other symptoms and signs involving the genitourinary system: Secondary | ICD-10-CM | POA: Diagnosis not present

## 2024-04-29 MED ORDER — CEPHALEXIN 500 MG PO CAPS: 500.0000 mg | ORAL_CAPSULE | Freq: Two times a day (BID) | ORAL | 0 refills | Status: AC

## 2024-04-29 NOTE — Progress Notes (Signed)

## 2024-04-29 NOTE — Progress Notes (Signed)
 I have spent 5 minutes in review of e-visit questionnaire, review and updating patient chart, medical decision making and response to patient.   Elsie Velma Lunger, PA-C

## 2024-09-22 ENCOUNTER — Encounter: Payer: Self-pay | Admitting: Family Medicine

## 2024-09-23 ENCOUNTER — Encounter: Payer: Self-pay | Admitting: Family Medicine

## 2024-09-23 ENCOUNTER — Telehealth: Admitting: Family Medicine

## 2024-09-23 VITALS — Ht 65.0 in | Wt 200.0 lb

## 2024-09-23 DIAGNOSIS — F411 Generalized anxiety disorder: Secondary | ICD-10-CM

## 2024-09-23 NOTE — Progress Notes (Signed)
 "  Subjective  CC:  Chief Complaint  Patient presents with   Medication Problem    Wants to discuss prozac    Virtual Visit via Video Note I connected with Cynthya A Woo on 09/23/24 at 10:30 AM EST by a video enabled telemedicine application and verified that I am speaking with the correct person using two identifiers. Location patient: Home Location provider: [facility] Persons participating in the virtual visit: Shamera A Cena Lavern LITTIE Jodie, MD Mercy Eck, CMA  I discussed the limitations of evaluation and management by telemedicine and the availability of in person appointments. The patient expressed understanding and agreed to proceed.  HPI: Gina Moreno is a 39 y.o. female who presents to the office today to address the problems listed above in the chief complaint. 39 year old with chronic GAD who has been well-controlled on Prozac  for the last 3 to 4 years.  We discussed her prior history of generalized anxiety disorder failing multiple SSRIs in years past.  Fortunately she has done very well.  Her life circumstances are quite stable currently.  Working, parenting and in a good relationship.  She understands risks of relapse, however would like to see how she does off of medication.  She would like to wean from Prozac .  She denies symptoms of depression, anxiety or panic attacks at this current time.  She is sleeping well.  She has learned better coping skills also.  Assessment  1. GAD (generalized anxiety disorder)      Plan  Generalized anxiety disorder: Very well-controlled and maintained for a number years on Prozac  20 mg daily.  Discussed risk benefits risk of recurrence off medication.  She will wean over the next 3 to 4 weeks.  Specific instructions given.  She will monitor for recurrence of anxiety or mood related symptoms.  She will follow-up with me as needed.  Follow up: As scheduled for complete physical 12/03/2024  No orders of the  defined types were placed in this encounter.  No orders of the defined types were placed in this encounter.     I reviewed the patients updated PMH, FH, and SocHx.    Patient Active Problem List   Diagnosis Date Noted   Oral contraceptive use 03/10/2018    Priority: High   Migraine without aura and responsive to treatment 03/22/2015    Priority: High   GAD (generalized anxiety disorder) 07/25/2013    Priority: High   White coat syndrome without diagnosis of hypertension 10/21/2023    Priority: Medium    History of uterine fibroid 10/21/2023    Priority: Medium    Irritable bowel syndrome with diarrhea 08/09/2021    Priority: Medium    Fibroadenoma of breast, right 06/26/2018    Priority: Medium    Learning disabilities 03/10/2018    Priority: Medium    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 Family History: Patient family history includes ADD / ADHD in her son; Anxiety disorder in her mother; Arthritis in her paternal grandmother; Cancer in her paternal grandfather and paternal grandmother; Depression in her paternal grandmother; Diabetes in her paternal  grandmother; Hyperlipidemia in her father; Thyroid  disease in her mother. Social History:  Patient  reports that she has never smoked. She has been exposed to tobacco smoke. She has never used smokeless tobacco. She reports that she does not drink alcohol and does not use drugs.  Review of Systems: Constitutional: Negative for fever malaise or anorexia Cardiovascular: negative for chest pain Respiratory: negative for SOB or  persistent cough Gastrointestinal: negative for abdominal pain  Objective  Vitals: Ht 5' 5 (1.651 m)   Wt 200 lb (90.7 kg) Comment: Verbal  BMI 33.28 kg/m  General: no acute distress , A&Ox3 Psych: Appears well.  Normal affect.  bright.  Normal speech  Commons side effects, risks, benefits, and alternatives for medications and treatment plan prescribed today were discussed, and the patient expressed understanding of the given instructions. Patient is instructed to call or message via MyChart if he/she has any questions or concerns regarding our treatment plan. No barriers to understanding were identified. We discussed Red Flag symptoms and signs in detail. Patient expressed understanding regarding what to do in case of urgent or emergency type symptoms.  Medication list was reconciled, printed and provided to the patient in AVS. Patient instructions and summary information was reviewed with the patient as documented in the AVS. This note was prepared with assistance of Dragon voice recognition software. Occasional wrong-word or sound-a-like substitutions may have occurred due to the inherent limitations of voice recognition software       [1]  Current Meds  Medication Sig   acetaminophen  (TYLENOL ) 325 MG tablet Take 650 mg by mouth every 6 (six) hours as needed for moderate pain.   cetirizine  (ZYRTEC ) 10 MG chewable tablet Chew 10 mg by mouth daily.   FLUoxetine  (PROZAC ) 20 MG tablet Take 1 tablet (20 mg total) by mouth daily.   ibuprofen  (ADVIL ) 600 MG tablet Take 1 tablet (600 mg total) by mouth every 6 (six) hours as needed.   Multiple Vitamins-Minerals (EMERGEN-C IMMUNE PLUS PO) Take by mouth.   norethindrone (ORTHO MICRONOR) 0.35 MG tablet Take 1 tablet every day by oral route.   "

## 2024-10-22 ENCOUNTER — Encounter: Admitting: Family Medicine

## 2024-12-03 ENCOUNTER — Encounter: Admitting: Family Medicine
